# Patient Record
Sex: Female | Born: 1965
Health system: Southern US, Community
[De-identification: ages and names within clinical notes are randomized; demographics above are authoritative.]

## PROBLEM LIST (undated history)

## (undated) VITALS — BP 119/94 | HR 83 | Temp 98.4°F | Resp 18 | Ht 63.0 in | Wt 162.0 lb

## (undated) DIAGNOSIS — F419 Anxiety disorder, unspecified: Secondary | ICD-10-CM

## (undated) DIAGNOSIS — J45909 Unspecified asthma, uncomplicated: Secondary | ICD-10-CM

## (undated) DIAGNOSIS — I10 Essential (primary) hypertension: Secondary | ICD-10-CM

## (undated) DIAGNOSIS — F329 Major depressive disorder, single episode, unspecified: Secondary | ICD-10-CM

## (undated) DIAGNOSIS — I639 Cerebral infarction, unspecified: Secondary | ICD-10-CM

## (undated) DIAGNOSIS — G8929 Other chronic pain: Secondary | ICD-10-CM

## (undated) DIAGNOSIS — M1990 Unspecified osteoarthritis, unspecified site: Secondary | ICD-10-CM

## (undated) DIAGNOSIS — E669 Obesity, unspecified: Secondary | ICD-10-CM

## (undated) DIAGNOSIS — F32A Depression, unspecified: Secondary | ICD-10-CM

## (undated) HISTORY — DX: Unspecified osteoarthritis, unspecified site: M19.90

## (undated) HISTORY — DX: Essential (primary) hypertension: I10

## (undated) HISTORY — DX: Unspecified asthma, uncomplicated: J45.909

---

## 1993-01-13 HISTORY — PX: TUBAL LIGATION: SHX77

## 1999-01-14 HISTORY — PX: APPENDECTOMY: SHX54

## 2007-04-29 ENCOUNTER — Emergency Department (HOSPITAL_COMMUNITY): Admission: EM | Admit: 2007-04-29 | Discharge: 2007-04-29 | Payer: Self-pay | Admitting: Emergency Medicine

## 2007-11-28 ENCOUNTER — Emergency Department (HOSPITAL_COMMUNITY): Admission: EM | Admit: 2007-11-28 | Discharge: 2007-11-28 | Payer: Self-pay | Admitting: Emergency Medicine

## 2008-03-16 ENCOUNTER — Emergency Department (HOSPITAL_COMMUNITY): Admission: EM | Admit: 2008-03-16 | Discharge: 2008-03-16 | Payer: Self-pay | Admitting: Emergency Medicine

## 2008-03-16 ENCOUNTER — Encounter (INDEPENDENT_AMBULATORY_CARE_PROVIDER_SITE_OTHER): Payer: Self-pay | Admitting: Emergency Medicine

## 2008-03-16 ENCOUNTER — Ambulatory Visit: Payer: Self-pay | Admitting: Surgery

## 2009-12-13 ENCOUNTER — Emergency Department (HOSPITAL_COMMUNITY)
Admission: EM | Admit: 2009-12-13 | Discharge: 2009-12-13 | Payer: Self-pay | Source: Home / Self Care | Admitting: Emergency Medicine

## 2010-02-16 ENCOUNTER — Emergency Department (HOSPITAL_COMMUNITY)
Admission: EM | Admit: 2010-02-16 | Discharge: 2010-02-16 | Disposition: A | Discharge status: Home / Self Care | Payer: Worker's Compensation | Attending: Emergency Medicine | Admitting: Emergency Medicine

## 2010-02-16 ENCOUNTER — Encounter (HOSPITAL_COMMUNITY): Payer: Self-pay

## 2010-02-16 ENCOUNTER — Emergency Department (HOSPITAL_COMMUNITY): Payer: Worker's Compensation

## 2010-02-16 DIAGNOSIS — Y9289 Other specified places as the place of occurrence of the external cause: Secondary | ICD-10-CM | POA: Insufficient documentation

## 2010-02-16 DIAGNOSIS — W1809XA Striking against other object with subsequent fall, initial encounter: Secondary | ICD-10-CM | POA: Insufficient documentation

## 2010-02-16 DIAGNOSIS — M25569 Pain in unspecified knee: Secondary | ICD-10-CM | POA: Insufficient documentation

## 2010-02-16 DIAGNOSIS — M25579 Pain in unspecified ankle and joints of unspecified foot: Secondary | ICD-10-CM | POA: Insufficient documentation

## 2010-02-16 HISTORY — DX: Obesity, unspecified: E66.9

## 2010-03-06 ENCOUNTER — Emergency Department (HOSPITAL_COMMUNITY): Payer: Worker's Compensation

## 2010-03-06 ENCOUNTER — Emergency Department (HOSPITAL_COMMUNITY)
Admission: EM | Admit: 2010-03-06 | Discharge: 2010-03-06 | Disposition: A | Discharge status: Home / Self Care | Payer: Worker's Compensation | Attending: Emergency Medicine | Admitting: Emergency Medicine

## 2010-03-06 DIAGNOSIS — M79609 Pain in unspecified limb: Secondary | ICD-10-CM | POA: Insufficient documentation

## 2010-03-06 DIAGNOSIS — Y9289 Other specified places as the place of occurrence of the external cause: Secondary | ICD-10-CM | POA: Insufficient documentation

## 2010-03-06 DIAGNOSIS — F17200 Nicotine dependence, unspecified, uncomplicated: Secondary | ICD-10-CM | POA: Insufficient documentation

## 2010-03-06 DIAGNOSIS — W19XXXA Unspecified fall, initial encounter: Secondary | ICD-10-CM | POA: Insufficient documentation

## 2010-04-25 LAB — CBC
Hemoglobin: 13.5 g/dL (ref 12.0–15.0)
MCHC: 33.2 g/dL (ref 30.0–36.0)
MCV: 86.9 fL (ref 78.0–100.0)
RBC: 4.68 MIL/uL (ref 3.87–5.11)

## 2010-05-24 ENCOUNTER — Emergency Department (HOSPITAL_COMMUNITY)
Admission: EM | Admit: 2010-05-24 | Discharge: 2010-05-24 | Disposition: A | Discharge status: Home / Self Care | Payer: Worker's Compensation | Attending: Emergency Medicine | Admitting: Emergency Medicine

## 2010-05-24 ENCOUNTER — Emergency Department (HOSPITAL_COMMUNITY): Payer: Worker's Compensation

## 2010-05-24 DIAGNOSIS — R079 Chest pain, unspecified: Secondary | ICD-10-CM | POA: Insufficient documentation

## 2010-05-24 DIAGNOSIS — R0609 Other forms of dyspnea: Secondary | ICD-10-CM | POA: Insufficient documentation

## 2010-05-24 DIAGNOSIS — R630 Anorexia: Secondary | ICD-10-CM | POA: Insufficient documentation

## 2010-05-24 DIAGNOSIS — R112 Nausea with vomiting, unspecified: Secondary | ICD-10-CM | POA: Insufficient documentation

## 2010-05-24 DIAGNOSIS — M25579 Pain in unspecified ankle and joints of unspecified foot: Secondary | ICD-10-CM | POA: Insufficient documentation

## 2010-05-24 DIAGNOSIS — R05 Cough: Secondary | ICD-10-CM | POA: Insufficient documentation

## 2010-05-24 DIAGNOSIS — R0989 Other specified symptoms and signs involving the circulatory and respiratory systems: Secondary | ICD-10-CM | POA: Insufficient documentation

## 2010-05-24 DIAGNOSIS — IMO0001 Reserved for inherently not codable concepts without codable children: Secondary | ICD-10-CM | POA: Insufficient documentation

## 2010-05-24 DIAGNOSIS — R059 Cough, unspecified: Secondary | ICD-10-CM | POA: Insufficient documentation

## 2010-05-24 DIAGNOSIS — J189 Pneumonia, unspecified organism: Secondary | ICD-10-CM | POA: Insufficient documentation

## 2010-05-25 ENCOUNTER — Emergency Department (HOSPITAL_COMMUNITY)
Admission: EM | Admit: 2010-05-25 | Discharge: 2010-05-25 | Disposition: A | Discharge status: Home / Self Care | Payer: Worker's Compensation | Attending: Emergency Medicine | Admitting: Emergency Medicine

## 2010-05-25 DIAGNOSIS — J069 Acute upper respiratory infection, unspecified: Secondary | ICD-10-CM | POA: Insufficient documentation

## 2010-05-25 DIAGNOSIS — F411 Generalized anxiety disorder: Secondary | ICD-10-CM | POA: Insufficient documentation

## 2010-05-25 DIAGNOSIS — R0602 Shortness of breath: Secondary | ICD-10-CM | POA: Insufficient documentation

## 2010-06-07 ENCOUNTER — Emergency Department (HOSPITAL_COMMUNITY)
Admission: EM | Admit: 2010-06-07 | Discharge: 2010-06-07 | Disposition: A | Discharge status: Home / Self Care | Payer: Worker's Compensation | Attending: Emergency Medicine | Admitting: Emergency Medicine

## 2010-06-07 DIAGNOSIS — F410 Panic disorder [episodic paroxysmal anxiety] without agoraphobia: Secondary | ICD-10-CM | POA: Insufficient documentation

## 2010-08-08 ENCOUNTER — Emergency Department (HOSPITAL_COMMUNITY): Payer: Worker's Compensation

## 2010-08-08 ENCOUNTER — Emergency Department (HOSPITAL_COMMUNITY)
Admission: EM | Admit: 2010-08-08 | Discharge: 2010-08-08 | Disposition: A | Discharge status: Home / Self Care | Payer: Worker's Compensation | Attending: Emergency Medicine | Admitting: Emergency Medicine

## 2010-08-08 DIAGNOSIS — M25579 Pain in unspecified ankle and joints of unspecified foot: Secondary | ICD-10-CM | POA: Insufficient documentation

## 2010-08-08 DIAGNOSIS — S82899A Other fracture of unspecified lower leg, initial encounter for closed fracture: Secondary | ICD-10-CM | POA: Insufficient documentation

## 2010-08-08 DIAGNOSIS — W19XXXA Unspecified fall, initial encounter: Secondary | ICD-10-CM | POA: Insufficient documentation

## 2011-02-20 ENCOUNTER — Emergency Department (HOSPITAL_COMMUNITY)
Admission: EM | Admit: 2011-02-20 | Discharge: 2011-02-20 | Disposition: A | Discharge status: Home / Self Care | Payer: Self-pay | Attending: Emergency Medicine | Admitting: Emergency Medicine

## 2011-02-20 ENCOUNTER — Emergency Department (HOSPITAL_COMMUNITY): Payer: Self-pay

## 2011-02-20 ENCOUNTER — Encounter (HOSPITAL_COMMUNITY): Payer: Self-pay | Admitting: Emergency Medicine

## 2011-02-20 DIAGNOSIS — M79609 Pain in unspecified limb: Secondary | ICD-10-CM | POA: Insufficient documentation

## 2011-02-20 DIAGNOSIS — M25561 Pain in right knee: Secondary | ICD-10-CM

## 2011-02-20 DIAGNOSIS — M545 Low back pain, unspecified: Secondary | ICD-10-CM | POA: Insufficient documentation

## 2011-02-20 DIAGNOSIS — G8929 Other chronic pain: Secondary | ICD-10-CM | POA: Insufficient documentation

## 2011-02-20 DIAGNOSIS — F17200 Nicotine dependence, unspecified, uncomplicated: Secondary | ICD-10-CM | POA: Insufficient documentation

## 2011-02-20 DIAGNOSIS — Z79899 Other long term (current) drug therapy: Secondary | ICD-10-CM | POA: Insufficient documentation

## 2011-02-20 DIAGNOSIS — M25569 Pain in unspecified knee: Secondary | ICD-10-CM | POA: Insufficient documentation

## 2011-02-20 DIAGNOSIS — M5430 Sciatica, unspecified side: Secondary | ICD-10-CM | POA: Insufficient documentation

## 2011-02-20 DIAGNOSIS — M25579 Pain in unspecified ankle and joints of unspecified foot: Secondary | ICD-10-CM | POA: Insufficient documentation

## 2011-02-20 DIAGNOSIS — M25562 Pain in left knee: Secondary | ICD-10-CM

## 2011-02-20 HISTORY — DX: Other chronic pain: G89.29

## 2011-02-20 MED ORDER — OXYCODONE-ACETAMINOPHEN 5-325 MG PO TABS
1.0000 | ORAL_TABLET | Freq: Once | ORAL | Status: AC
  Administered 2011-02-20: 1 via ORAL
  Filled 2011-02-20: qty 1

## 2011-02-20 MED ORDER — DIAZEPAM 5 MG PO TABS: 5.0000 mg | ORAL_TABLET | Freq: Three times a day (TID) | ORAL | Status: AC | PRN

## 2011-02-20 MED ORDER — OXYCODONE-ACETAMINOPHEN 5-325 MG PO TABS: 1.0000 | ORAL_TABLET | Freq: Four times a day (QID) | ORAL | Status: AC | PRN

## 2011-02-20 MED ORDER — PREDNISONE 50 MG PO TABS: 50.0000 mg | ORAL_TABLET | Freq: Every day | ORAL | Status: AC

## 2011-02-20 NOTE — Progress Notes (Signed)
Orthopedic Tech Progress Note Patient Details:  Brittany Jimenez 1966-01-02 [id number]  Other Ortho Devices Type of Ortho Device: ASO Ortho Device Interventions: Application   Brittany Jimenez, Brittany Jimenez 02/20/2011, 12:53 PM

## 2011-02-20 NOTE — ED Notes (Signed)
Pt c/o lower back, bilateral knee and bilateral ankle pain x several months; pt sts broke right ankle last July and now is giving out on her. Pt sts knee and back pain since last march; pt sts hx of anxiety and appears anxious at present

## 2011-02-24 NOTE — ED Provider Notes (Signed)
Medical screening examination/treatment/procedure(s) were performed by non-physician practitioner and as supervising physician I was immediately available for consultation/collaboration.   Tatianna Ibbotson L Candy Ziegler, MD 02/24/11 1922 

## 2011-02-24 NOTE — ED Provider Notes (Signed)
History     CSN: [id number]  Arrival date & time 02/20/11  7829   First MD Initiated Contact with Patient 02/20/11 1013      Chief Complaint  Patient presents with  . Leg Pain  . Knee Pain  . Back Pain    (Consider location/radiation/quality/duration/timing/severity/associated sxs/prior treatment) HPI The patient presents to the ER with chronic lower back, bilateral knee, and bilateral ankle pain(R>L) for the last few months. The patient states that she fell at work last March and since then has had worsening problems with her various spots of pain. The patient denies numbness, weakness, N/V, abd pain, CP, SOB, bowel or bladder incontinence, or diarrhea. The patient states that she has tried nothing at home for the pain. Movement seems to make all of her pain worse. Past Medical History  Diagnosis Date  . Obesity   . Chronic pain     History reviewed. No pertinent past surgical history.  History reviewed. No pertinent family history.  History  Substance Use Topics  . Smoking status: Current Everyday Smoker  . Smokeless tobacco: Not on file  . Alcohol Use: No    OB History    Grav Para Term Preterm Abortions TAB SAB Ect Mult Living                  Review of Systems All pertinent positives and negatives reviewed in the history of present illness  Allergies  Review of patient's allergies indicates no known allergies.  Home Medications   Current Outpatient Rx  Name Route Sig Dispense Refill  . ACETAMINOPHEN 500 MG PO TABS Oral Take 1,000 mg by mouth every 6 (six) hours as needed. For pain.    Marland Kitchen DIAZEPAM 5 MG PO TABS Oral Take 1 tablet (5 mg total) by mouth every 8 (eight) hours as needed (Spasm). 12 tablet 0  . OXYCODONE-ACETAMINOPHEN 5-325 MG PO TABS Oral Take 1 tablet by mouth every 6 (six) hours as needed for pain. 15 tablet 0  . PREDNISONE 50 MG PO TABS Oral Take 1 tablet (50 mg total) by mouth daily. 5 tablet 0    BP 152/84  Pulse 56  Temp(Src) 97.8 F  (36.6 C) (Oral)  Resp 20  SpO2 100%  LMP 02/07/2011  Physical Exam  Constitutional: She is oriented to person, place, and time. She appears well-developed and well-nourished.  HENT:  Head: Normocephalic and atraumatic.  Cardiovascular: Normal rate, regular rhythm and normal heart sounds.  Exam reveals no gallop and no friction rub.   No murmur heard. Pulmonary/Chest: Effort normal and breath sounds normal. No respiratory distress. She has no wheezes. She has no rales.  Musculoskeletal:       Right knee: She exhibits normal range of motion, no swelling, no effusion, no deformity, normal patellar mobility and no bony tenderness. tenderness found.       Left knee: She exhibits normal range of motion, no swelling, no effusion, no ecchymosis and no deformity. tenderness found.       Right ankle: She exhibits normal range of motion, no swelling, no ecchymosis and no deformity. tenderness.       Left ankle: She exhibits normal range of motion, no swelling, no ecchymosis and no deformity. tenderness.       Arms:      Legs: Neurological: She is alert and oriented to person, place, and time. She has normal strength and normal reflexes. No sensory deficit. Coordination and gait normal.  Reflex Scores:  Patellar reflexes are 2+ on the right side and 2+ on the left side.      Achilles reflexes are 2+ on the right side and 2+ on the left side. Skin: Skin is warm and dry. No rash noted.    ED Course  Procedures (including critical care time)  Labs Reviewed - No data to display No results found.   1. Sciatica   2. Ankle pain   3. Knee pain, bilateral    The patient most likely has arthritic changes in the affected joints. She is stable at this time and will be directed for follow up. Ice and heat to her knees and back and ankles. The patient was asking me directed questions that seemed to be leading to attempt to say that Dr. Luiz Blare may have caused these degenerative changes in her ankle  seen on x-ray. I advised her that she may have had this developing previous to her fractures and that the injury can accelerate the process. I advised her that her fractures have healed and are in normal healed alignment.    MDM  MDM Reviewed: vitals, nursing note and previous chart Reviewed previous: x-ray Interpretation: x-ray           Carlyle Dolly, PA-C 02/24/11 [id number]

## 2011-04-17 ENCOUNTER — Emergency Department (HOSPITAL_COMMUNITY): Payer: Self-pay

## 2011-04-17 ENCOUNTER — Other Ambulatory Visit: Payer: Self-pay

## 2011-04-17 ENCOUNTER — Emergency Department (HOSPITAL_COMMUNITY)
Admission: EM | Admit: 2011-04-17 | Discharge: 2011-04-17 | Disposition: A | Discharge status: Home / Self Care | Payer: Self-pay | Attending: Emergency Medicine | Admitting: Emergency Medicine

## 2011-04-17 DIAGNOSIS — F909 Attention-deficit hyperactivity disorder, unspecified type: Secondary | ICD-10-CM | POA: Insufficient documentation

## 2011-04-17 DIAGNOSIS — R450 Nervousness: Secondary | ICD-10-CM | POA: Insufficient documentation

## 2011-04-17 DIAGNOSIS — F419 Anxiety disorder, unspecified: Secondary | ICD-10-CM

## 2011-04-17 DIAGNOSIS — Z79899 Other long term (current) drug therapy: Secondary | ICD-10-CM | POA: Insufficient documentation

## 2011-04-17 DIAGNOSIS — R0789 Other chest pain: Secondary | ICD-10-CM | POA: Insufficient documentation

## 2011-04-17 DIAGNOSIS — F17200 Nicotine dependence, unspecified, uncomplicated: Secondary | ICD-10-CM | POA: Insufficient documentation

## 2011-04-17 DIAGNOSIS — F411 Generalized anxiety disorder: Secondary | ICD-10-CM | POA: Insufficient documentation

## 2011-04-17 DIAGNOSIS — Z760 Encounter for issue of repeat prescription: Secondary | ICD-10-CM | POA: Insufficient documentation

## 2011-04-17 DIAGNOSIS — R0602 Shortness of breath: Secondary | ICD-10-CM | POA: Insufficient documentation

## 2011-04-17 MED ORDER — LORAZEPAM 1 MG PO TABS
1.0000 mg | ORAL_TABLET | Freq: Once | ORAL | Status: AC
  Administered 2011-04-17: 1 mg via ORAL
  Filled 2011-04-17: qty 1

## 2011-04-17 MED ORDER — TRAMADOL HCL 50 MG PO TABS: 50.0000 mg | ORAL_TABLET | Freq: Four times a day (QID) | ORAL | Status: AC | PRN

## 2011-04-17 MED ORDER — LORAZEPAM 1 MG PO TABS: 1.0000 mg | ORAL_TABLET | Freq: Three times a day (TID) | ORAL | Status: AC | PRN

## 2011-04-17 NOTE — ED Notes (Signed)
Discharged to home with written and verbal instructions.  No questions or concerns at discharge.

## 2011-04-17 NOTE — ED Notes (Addendum)
Pt states that she has been out of her pills for anxiety, back spasms and pain. For a few days, and that when she walks she develops SOB. Has a history of this and says that taking her anxiety pills help. Pt speaks full sentences, and o2 saturation 100%. Lungs clear bilaterally, and diminished lower base right side.

## 2011-04-17 NOTE — ED Notes (Signed)
Returned from radiology. 

## 2011-04-17 NOTE — ED Provider Notes (Signed)
History     CSN: [id number]  Arrival date & time 04/17/11  1018   First MD Initiated Contact with Patient 04/17/11 1101      Chief Complaint  Patient presents with  . Shortness of Breath  . Panic Attack  . Medication Refill    (Consider location/radiation/quality/duration/timing/severity/associated sxs/prior treatment) HPI Comments: Patient complains of "anxiety attack" for the past 3 days. He said the police raided a house on her [address] and she's been upset ever since. Associated with shortness of breath and substernal chest pain that has been constant for the past 3 days. She normally takes a pill for anxiety but ran out 2 days ago. She also has her chronic back pain which is unchanged. There is no weakness, numbness, tingling, bowel or bladder incontinence, fever or vomiting.  The history is provided by the patient.    Past Medical History  Diagnosis Date  . Obesity   . Chronic pain     No past surgical history on file.  No family history on file.  History  Substance Use Topics  . Smoking status: Current Everyday Smoker  . Smokeless tobacco: Not on file  . Alcohol Use: No    OB History    Grav Para Term Preterm Abortions TAB SAB Ect Mult Living                  Review of Systems  Constitutional: Positive for activity change and appetite change. Negative for fever.  HENT: Negative for congestion and rhinorrhea.   Eyes: Negative for visual disturbance.  Respiratory: Positive for chest tightness and shortness of breath.   Cardiovascular: Positive for chest pain.  Gastrointestinal: Negative for nausea, vomiting and abdominal pain.  Genitourinary: Negative for dysuria and hematuria.  Musculoskeletal: Negative for back pain.  Psychiatric/Behavioral: Negative for suicidal ideas. The patient is nervous/anxious and is hyperactive.     Allergies  Review of patient's allergies indicates no known allergies.  Home Medications   Current Outpatient Rx  Name Route Sig  Dispense Refill  . ACETAMINOPHEN 500 MG PO TABS Oral Take 1,000 mg by mouth every 6 (six) hours as needed. For pain.    Marland Kitchen DIAZEPAM 5 MG PO TABS Oral Take 5 mg by mouth every 8 (eight) hours as needed. anxiety    . CYMBALTA PO Oral Take 1 capsule by mouth See admin instructions. Patient took one dose Monday 04/14/11    . OXYCODONE-ACETAMINOPHEN 5-325 MG PO TABS Oral Take 1 tablet by mouth every 4 (four) hours as needed. pain    . LORAZEPAM 1 MG PO TABS Oral Take 1 tablet (1 mg total) by mouth 3 (three) times daily as needed for anxiety. 5 tablet 0  . TRAMADOL HCL 50 MG PO TABS Oral Take 1 tablet (50 mg total) by mouth every 6 (six) hours as needed for pain. 15 tablet 0    BP 153/94  Pulse 78  Temp(Src) 98.3 F (36.8 C) (Oral)  Resp 20  SpO2 99%  Physical Exam  Constitutional: She is oriented to person, place, and time. She appears well-developed and well-nourished. No distress.  HENT:  Head: Normocephalic and atraumatic.  Mouth/Throat: Oropharynx is clear and moist. No oropharyngeal exudate.  Eyes: Conjunctivae are normal. Pupils are equal, round, and reactive to light.  Neck: Neck supple.  Cardiovascular: Normal rate, regular rhythm and normal heart sounds.   No murmur heard. Pulmonary/Chest: Effort normal and breath sounds normal. No respiratory distress.  Abdominal: Soft. There is no tenderness. There  is no rebound and no guarding.  Musculoskeletal: Normal range of motion. She exhibits no edema and no tenderness.  Neurological: She is alert and oriented to person, place, and time. No cranial nerve deficit.  Skin: Skin is warm.    ED Course  Procedures (including critical care time)  Labs Reviewed - No data to display No results found.   1. Anxiety       MDM  Chronic back pain, anxiety, chest pain or shortness of breath. Symptoms consistent with anxiety per patient. Description of chest pain not consistent with ACS. PERC neg.  Chest x-ray negative. No ischemic changes on  EKG. Symptoms resolved. Patient comfortable no distress   Date: 04/17/2011  Rate: 57 Rhythm: sinus bradycardia  QRS Axis: normal  Intervals: normal  ST/T Wave abnormalities: normal  Conduction Disutrbances:none  Narrative Interpretation:   Old EKG Reviewed: none available      Glynn Octave, MD 04/17/11 1308

## 2011-04-17 NOTE — Discharge Instructions (Signed)
Anxiety and Panic Attacks Your caregiver has informed you that you are having an anxiety or panic attack. There may be many forms of this. Most of the time these attacks come suddenly and without warning. They come at any time of day, including periods of sleep, and at any time of life. They may be strong and unexplained. Although panic attacks are very scary, they are physically harmless. Sometimes the cause of your anxiety is not known. Anxiety is a protective mechanism of the body in its fight or flight mechanism. Most of these perceived danger situations are actually nonphysical situations (such as anxiety over losing a job). CAUSES  The causes of an anxiety or panic attack are many. Panic attacks may occur in otherwise healthy people given a certain set of circumstances. There may be a genetic cause for panic attacks. Some medications may also have anxiety as a side effect. SYMPTOMS  Some of the most common feelings are:  Intense terror.   Dizziness, feeling faint.   Hot and cold flashes.   Fear of going crazy.   Feelings that nothing is real.   Sweating.   Shaking.   Chest pain or a fast heartbeat (palpitations).   Smothering, choking sensations.   Feelings of impending doom and that death is near.   Tingling of extremities, this may be from over-breathing.   Altered reality (derealization).   Being detached from yourself (depersonalization).  Several symptoms can be present to make up anxiety or panic attacks. DIAGNOSIS  The evaluation by your caregiver will depend on the type of symptoms you are experiencing. The diagnosis of anxiety or panic attack is made when no physical illness can be determined to be a cause of the symptoms. TREATMENT  Treatment to prevent anxiety and panic attacks may include:  Avoidance of circumstances that cause anxiety.   Reassurance and relaxation.   Regular exercise.   Relaxation therapies, such as yoga.   Psychotherapy with a  psychiatrist or therapist.   Avoidance of caffeine, alcohol and illegal drugs.   Prescribed medication.  SEEK IMMEDIATE MEDICAL CARE IF:   You experience panic attack symptoms that are different than your usual symptoms.   You have any worsening or concerning symptoms.  Document Released: 12/30/2004 Document Revised: 12/19/2010 Document Reviewed: 05/03/2009 [facility] Patient Information 2012 [place], [place].

## 2011-05-12 ENCOUNTER — Encounter (HOSPITAL_COMMUNITY): Payer: Self-pay | Admitting: Emergency Medicine

## 2011-05-12 ENCOUNTER — Emergency Department (HOSPITAL_COMMUNITY): Payer: Self-pay

## 2011-05-12 ENCOUNTER — Emergency Department (HOSPITAL_COMMUNITY)
Admission: EM | Admit: 2011-05-12 | Discharge: 2011-05-12 | Disposition: A | Discharge status: Home / Self Care | Payer: Self-pay | Attending: Emergency Medicine | Admitting: Emergency Medicine

## 2011-05-12 DIAGNOSIS — M25569 Pain in unspecified knee: Secondary | ICD-10-CM | POA: Insufficient documentation

## 2011-05-12 DIAGNOSIS — M25469 Effusion, unspecified knee: Secondary | ICD-10-CM | POA: Insufficient documentation

## 2011-05-12 DIAGNOSIS — M25579 Pain in unspecified ankle and joints of unspecified foot: Secondary | ICD-10-CM | POA: Insufficient documentation

## 2011-05-12 DIAGNOSIS — M25571 Pain in right ankle and joints of right foot: Secondary | ICD-10-CM

## 2011-05-12 DIAGNOSIS — M25462 Effusion, left knee: Secondary | ICD-10-CM

## 2011-05-12 DIAGNOSIS — G8929 Other chronic pain: Secondary | ICD-10-CM | POA: Insufficient documentation

## 2011-05-12 DIAGNOSIS — F17200 Nicotine dependence, unspecified, uncomplicated: Secondary | ICD-10-CM | POA: Insufficient documentation

## 2011-05-12 HISTORY — DX: Depression, unspecified: F32.A

## 2011-05-12 HISTORY — DX: Major depressive disorder, single episode, unspecified: F32.9

## 2011-05-12 MED ORDER — IBUPROFEN 800 MG PO TABS
800.0000 mg | ORAL_TABLET | Freq: Once | ORAL | Status: AC
  Administered 2011-05-12: 800 mg via ORAL
  Filled 2011-05-12: qty 1

## 2011-05-12 MED ORDER — TRAMADOL HCL 50 MG PO TABS: 50.0000 mg | ORAL_TABLET | Freq: Four times a day (QID) | ORAL | Status: DC | PRN

## 2011-05-12 MED ORDER — IBUPROFEN 600 MG PO TABS: 600.0000 mg | ORAL_TABLET | Freq: Four times a day (QID) | ORAL | Status: DC | PRN

## 2011-05-12 NOTE — ED Notes (Signed)
Ortho paged. 

## 2011-05-12 NOTE — ED Provider Notes (Signed)
History     CSN: [id number]  Arrival date & time 05/12/11  1104   First MD Initiated Contact with Patient 05/12/11 1246      Chief Complaint  Patient presents with  . Knee Pain    (Consider location/radiation/quality/duration/timing/severity/associated sxs/prior treatment) HPI Pt has had 4 days of L ankle pain and R knee pain. States she was walking a moderate amount before symptoms started. No trauma. Pt broke R ankle and states she needed surgery but chose not to have it. She has had episodic pain since. No fever, chills, warmth or redness.  Past Medical History  Diagnosis Date  . Obesity   . Chronic pain   . Depression     Past Surgical History  Procedure Date  . Appendectomy     No family history on file.  History  Substance Use Topics  . Smoking status: Current Everyday Smoker  . Smokeless tobacco: Not on file  . Alcohol Use: No    OB History    Grav Para Term Preterm Abortions TAB SAB Ect Mult Living                  Review of Systems  Constitutional: Negative for fever and chills.  Musculoskeletal: Positive for joint swelling and arthralgias. Negative for myalgias and back pain.  Skin: Negative for pallor, rash and wound.  Neurological: Negative for weakness and numbness.    Allergies  Review of patient's allergies indicates no known allergies.  Home Medications   Current Outpatient Rx  Name Route Sig Dispense Refill  . ACETAMINOPHEN 500 MG PO TABS Oral Take 1,000 mg by mouth every 6 (six) hours as needed. For pain.    . ALBUTEROL SULFATE HFA 108 (90 BASE) MCG/ACT IN AERS Inhalation Inhale 2 puffs into the lungs every 6 (six) hours as needed. For shortness of breath    . DIAZEPAM 5 MG PO TABS Oral Take 5 mg by mouth every 8 (eight) hours as needed. anxiety    . DULOXETINE HCL 30 MG PO CPEP Oral Take 30 mg by mouth daily.    . OXYCODONE-ACETAMINOPHEN 5-325 MG PO TABS Oral Take 1 tablet by mouth every 4 (four) hours as needed. pain    . IBUPROFEN  600 MG PO TABS Oral Take 1 tablet (600 mg total) by mouth every 6 (six) hours as needed for pain. 30 tablet 0  . TRAMADOL HCL 50 MG PO TABS Oral Take 1 tablet (50 mg total) by mouth every 6 (six) hours as needed for pain. 15 tablet 0    BP 149/89  Pulse 73  Temp(Src) 98.1 F (36.7 C) (Oral)  Resp 18  SpO2 100%  LMP 05/06/2011  Physical Exam  Nursing note and vitals reviewed. Constitutional: She is oriented to person, place, and time. She appears well-developed and well-nourished. No distress.  HENT:  Head: Normocephalic and atraumatic.  Mouth/Throat: Oropharynx is clear and moist.  Eyes: EOM are normal. Pupils are equal, round, and reactive to light.  Neck: Normal range of motion. Neck supple.  Cardiovascular: Normal rate and regular rhythm.   Pulmonary/Chest: Effort normal and breath sounds normal. No respiratory distress. She has no wheezes. She has no rales.  Abdominal: Soft. Bowel sounds are normal. There is no tenderness. There is no rebound and no guarding.  Musculoskeletal: Normal range of motion. She exhibits tenderness (mild ttp over medial surface of L knee along joint line. No deformity, laxity, obvious trauma or swelling. R ankle without trauma, +ttp over lat  mal. FROM). She exhibits no edema.  Neurological: She is alert and oriented to person, place, and time.       5/5 motor and sensation intact  Skin: Skin is warm and dry. No rash noted. No erythema.  Psychiatric: She has a normal mood and affect. Her behavior is normal.    ED Course  Procedures (including critical care time)  Labs Reviewed - No data to display Dg Knee 2 Views Left  05/12/2011  *RADIOLOGY REPORT*  Clinical Data: Knee pain, swelling  LEFT KNEE - 1-2 VIEW  Comparison: None.  Findings: Two views of the left knee submitted.  No acute fracture or subluxation.  There is spurring of the medial femoral condyle. Question small joint effusion. Mild narrowing of medial joint compartment.  IMPRESSION: No acute  fracture or subluxation.  Spurring of medial femoral condyle.  Question small joint effusion.  Mild narrowing of the medial joint compartment.  Original Report Authenticated By: Natasha Mead, M.D.   Dg Ankle 2 Views Right  05/12/2011  *RADIOLOGY REPORT*  Clinical Data: Ankle pain  RIGHT ANKLE - 2 VIEW  Comparison: 02/20/2011  Findings: Two views of the right ankle submitted. No acute fracture or subluxation.  Again noted  healed fracture of the distal tibia and fibula with secondary degenerative changes.  No radiopaque foreign body.  Ankle mortise is preserved.  IMPRESSION: No acute fracture or subluxation. Again noted  healed fracture of distal tibia and fibula with secondary degenerative changes.  Original Report Authenticated By: Natasha Mead, M.D.     1. Knee effusion, left   2. Right ankle pain       MDM          Loren Racer, MD 05/12/11 1451

## 2011-05-12 NOTE — Progress Notes (Signed)
Orthopedic Tech Progress Note Patient Details:  Brittany Jimenez 04/29/1965 [id number]  Other Ortho Devices Type of Ortho Device: Knee Sleeve Ortho Device Location: (L) LE Ortho Device Interventions: Application   Jennye Moccasin 05/12/2011, 3:02 PM

## 2011-05-12 NOTE — Discharge Instructions (Signed)
Arthralgia Your caregiver has diagnosed you as suffering from an arthralgia. Arthralgia means there is pain in a joint. This can come from many reasons including:  Bruising the joint which causes soreness (inflammation) in the joint.   Wear and tear on the joints which occur as we grow older (osteoarthritis).   Overusing the joint.   Various forms of arthritis.   Infections of the joint.  Regardless of the cause of pain in your joint, most of these different pains respond to anti-inflammatory drugs and rest. The exception to this is when a joint is infected, and these cases are treated with antibiotics, if it is a bacterial infection. HOME CARE INSTRUCTIONS   Rest the injured area for as long as directed by your caregiver. Then slowly start using the joint as directed by your caregiver and as the pain allows. Crutches as directed may be useful if the ankles, knees or hips are involved. If the knee was splinted or casted, continue use and care as directed. If an stretchy or elastic wrapping bandage has been applied today, it should be removed and re-applied every 3 to 4 hours. It should not be applied tightly, but firmly enough to keep swelling down. Watch toes and feet for swelling, bluish discoloration, coldness, numbness or excessive pain. If any of these problems (symptoms) occur, remove the ace bandage and re-apply more loosely. If these symptoms persist, contact your caregiver or return to this location.   For the first 24 hours, keep the injured extremity elevated on pillows while lying down.   Apply ice for 15 to 20 minutes to the sore joint every couple hours while awake for the first half day. Then 3 to 4 times per day for the first 48 hours. Put the ice in a plastic bag and place a towel between the bag of ice and your skin.   Wear any splinting, casting, elastic bandage applications, or slings as instructed.   Only take over-the-counter or prescription medicines for pain,  discomfort, or fever as directed by your caregiver. Do not use aspirin immediately after the injury unless instructed by your physician. Aspirin can cause increased bleeding and bruising of the tissues.   If you were given crutches, continue to use them as instructed and do not resume weight bearing on the sore joint until instructed.  Persistent pain and inability to use the sore joint as directed for more than 2 to 3 days are warning signs indicating that you should see a caregiver for a follow-up visit as soon as possible. Initially, a hairline fracture (break in bone) may not be evident on X-rays. Persistent pain and swelling indicate that further evaluation, non-weight bearing or use of the joint (use of crutches or slings as instructed), or further X-rays are indicated. X-rays may sometimes not show a small fracture until a week or 10 days later. Make a follow-up appointment with your own caregiver or one to whom we have referred you. A radiologist (specialist in reading X-rays) may read your X-rays. Make sure you know how you are to obtain your X-ray results. Do not assume everything is normal if you do not hear from us. SEEK MEDICAL CARE IF: Bruising, swelling, or pain increases. SEEK IMMEDIATE MEDICAL CARE IF:   Your fingers or toes are numb or blue.   The pain is not responding to medications and continues to stay the same or get worse.   The pain in your joint becomes severe.   You develop a fever over   102 F (38.9 C).   It becomes impossible to move or use the joint.  MAKE SURE YOU:   Understand these instructions.   Will watch your condition.   Will get help right away if you are not doing well or get worse.  Document Released: 12/30/2004 Document Revised: 12/19/2010 Document Reviewed: 08/18/2007 [employer] Patient Information 2012 [employer]. 

## 2011-05-12 NOTE — ED Notes (Signed)
Rt ankle pain  Left knee pain and swelling  x 4 days denies injury

## 2011-05-22 ENCOUNTER — Emergency Department (HOSPITAL_COMMUNITY)
Admission: EM | Admit: 2011-05-22 | Discharge: 2011-05-22 | Disposition: A | Discharge status: Home / Self Care | Payer: Self-pay | Attending: Emergency Medicine | Admitting: Emergency Medicine

## 2011-05-22 ENCOUNTER — Emergency Department (HOSPITAL_COMMUNITY): Payer: Self-pay

## 2011-05-22 ENCOUNTER — Encounter (HOSPITAL_COMMUNITY): Payer: Self-pay | Admitting: Emergency Medicine

## 2011-05-22 DIAGNOSIS — F329 Major depressive disorder, single episode, unspecified: Secondary | ICD-10-CM | POA: Insufficient documentation

## 2011-05-22 DIAGNOSIS — G8929 Other chronic pain: Secondary | ICD-10-CM | POA: Insufficient documentation

## 2011-05-22 DIAGNOSIS — F17200 Nicotine dependence, unspecified, uncomplicated: Secondary | ICD-10-CM | POA: Insufficient documentation

## 2011-05-22 DIAGNOSIS — R791 Abnormal coagulation profile: Secondary | ICD-10-CM | POA: Insufficient documentation

## 2011-05-22 DIAGNOSIS — R072 Precordial pain: Secondary | ICD-10-CM | POA: Insufficient documentation

## 2011-05-22 DIAGNOSIS — F3289 Other specified depressive episodes: Secondary | ICD-10-CM | POA: Insufficient documentation

## 2011-05-22 LAB — CBC
HCT: 40 % (ref 36.0–46.0)
Hemoglobin: 13.8 g/dL (ref 12.0–15.0)
MCHC: 34.5 g/dL (ref 30.0–36.0)
RBC: 4.79 MIL/uL (ref 3.87–5.11)
WBC: 8 10*3/uL (ref 4.0–10.5)

## 2011-05-22 LAB — DIFFERENTIAL
Basophils Absolute: 0 10*3/uL (ref 0.0–0.1)
Lymphocytes Relative: 30 % (ref 12–46)
Lymphs Abs: 2.4 10*3/uL (ref 0.7–4.0)
Monocytes Absolute: 0.6 10*3/uL (ref 0.1–1.0)
Monocytes Relative: 8 % (ref 3–12)
Neutro Abs: 4.4 10*3/uL (ref 1.7–7.7)
Neutrophils Relative %: 55 % (ref 43–77)

## 2011-05-22 LAB — BASIC METABOLIC PANEL
Calcium: 9 mg/dL (ref 8.4–10.5)
Creatinine, Ser: 0.76 mg/dL (ref 0.50–1.10)
GFR calc non Af Amer: >90 mL/min (ref >90)
Glucose, Bld: 86 mg/dL (ref 70–99)
Sodium: 136 mEq/L (ref 135–145)

## 2011-05-22 LAB — D-DIMER, QUANTITATIVE: D-Dimer, Quant: 0.95 ug/mL-FEU — ABNORMAL HIGH (ref 0.00–0.48)

## 2011-05-22 LAB — TROPONIN I: Troponin I: <0.3 ng/mL (ref <0.30)

## 2011-05-22 MED ORDER — IOHEXOL 350 MG/ML SOLN
80.0000 mL | Freq: Once | INTRAVENOUS | Status: AC | PRN
  Administered 2011-05-22: 80 mL via INTRAVENOUS

## 2011-05-22 MED ORDER — IBUPROFEN 600 MG PO TABS: 600.0000 mg | ORAL_TABLET | Freq: Four times a day (QID) | ORAL | Status: AC | PRN

## 2011-05-22 MED ORDER — MORPHINE SULFATE 4 MG/ML IJ SOLN
4.0000 mg | Freq: Once | INTRAMUSCULAR | Status: AC
  Administered 2011-05-22: 4 mg via INTRAVENOUS
  Filled 2011-05-22: qty 1

## 2011-05-22 MED ORDER — ASPIRIN 81 MG PO CHEW
324.0000 mg | CHEWABLE_TABLET | Freq: Once | ORAL | Status: AC
  Administered 2011-05-22: 324 mg via ORAL
  Filled 2011-05-22: qty 4

## 2011-05-22 NOTE — ED Notes (Signed)
Patient states constant chest pain for three days continued today. Pain for the past three days 10/10 achy sharp with intermittent shortness of breath.  Airway intact bilateral equal chest rise and fall.  Patient stated has radiating right upper extremity tingling. Radial pulse +2 bilateral skin warm.  No distress noted upon ambulating from triage to stretcher 6. Steady gait.

## 2011-05-22 NOTE — Discharge Instructions (Signed)
Chest Pain (Nonspecific) Chest pain has many causes. Your pain could be caused by something serious, such as a heart attack or a blood clot in the lungs. It could also be caused by something less serious, such as a chest bruise or a virus. Follow up with your doctor. More lab tests or other studies may be needed to find the cause of your pain. Most of the time, nonspecific chest pain will improve within 2 to 3 days of rest and mild pain medicine. HOME CARE  For chest bruises, you may put ice on the sore area for 15 to 20 minutes, 3 to 4 times a day. Do this only if it makes you or your child feel better.   Put ice in a plastic bag.   Place a towel between the skin and the bag.   Rest for the next 2 to 3 days.   Go back to work if the pain improves.   See your doctor if the pain lasts longer than 1 to 2 weeks.   Only take medicine as told by your doctor.   Quit smoking if you smoke.  GET HELP RIGHT AWAY IF:   There is more pain or pain that spreads to the arm, neck, jaw, back, or belly (abdomen).   You or your child has shortness of breath.   You or your child coughs more than usual or coughs up blood.   You or your child has very bad back or belly pain, feels sick to his or her stomach (nauseous), or throws up (vomits).   You or your child has very bad weakness.   You or your child passes out (faints).   You or your child has a temperature by mouth above 102 F (38.9 C), not controlled by medicine.  Any of these problems may be serious and may be an emergency. Do not wait to see if the problems will go away. Get medical help right away. Call your local emergency services 911 in [place] Do not drive yourself to the hospital. MAKE SURE YOU:   Understand these instructions.   Will watch this condition.   Will get help right away if you or your child is not doing well or gets worse.  Document Released: 06/18/2007 Document Revised: 12/19/2010 Document Reviewed:  06/18/2007 ExitCare Patient Information 2012 [employer]  RESOURCE GUIDE  Dental Problems  Patients with Medicaid: [facility]                     [facility] [address]                                           [address] Phone:  [phone]                                                   Phone:  [phone]  If unable to pay or uninsured, contact:  [facility] or [facility] to become qualified for the adult dental clinic.  Chronic Pain Problems Contact [facility]  [phone] Patients need to be referred by their primary care doctor.  Insufficient Money for Medicine Contact [employer]:  call "211" or   [facility] 271-5999.  No Primary Care Doctor Call [facility]  832-8000 Other agencies that provide inexpensive medical care    [facility]  [phone]    [facility]  [phone]    [facility]  [phone]    [facility]  [phone]    [facility]  [phone]    [facility]  [phone]  [facility]  Health  [phone] [facility]  [phone] [facility]   [phone] (emergency services [phone])  Abuse/Neglect [place] Child Abuse Hotline (336) 641-3795 [place] [facility] Hotline 800-378-5315 (After Hours)  Emergency Shelter [facility] (336) 271-5985  [facility] (336) 275-9566 [facility] (704) 372-4663  MRSA Hotline #:   832-7006    [place] [facility]  [facility] [place]  [employer]                           [facility] [address] [place]                     [address]         [address]                                                 [place]                              [place] Phone:  [phone]                                   Phone:  [phone]                   Phone:  [phone]  [facility] Phone:  [phone]  [place] Child Abuse Hotline (336) 342-1394 (336) 342-3537 (After Hours)  

## 2011-05-22 NOTE — Progress Notes (Signed)
This patient has received 20 ml's of IV omni 350 (type of contrast) contrast extravasation into LT Antecubital (part of body) during a Ct angio Chest exam.  The exam was performed on (date) 05/22/2011  Site / affected area assessed by Dr. Molli Posey  I personally examined the patient. She had a small "lump" in her distal anterior arm, just proximal to the antecubital fossa.  She stated that the area was not tender to touch.  The skin was cool and intact without erythema.  Patient had 2+ radial pulse, normal capillary refill at nail beds and normal grip strength.  She stated she was having no parathesias.  Post-extravasation instructions were given to the patient.  Her nurse in the ER was alerted to the extravasation.

## 2011-05-22 NOTE — ED Provider Notes (Signed)
History   This chart was scribed for Forbes Cellar, MD by Melba Coon. The patient was seen in room CDU04/CDU04 and the patient's care was started at 5:27PM.    CSN: [id number]  Arrival date & time 05/22/11  1551   None     Chief Complaint  Patient presents with  . Hypertension  . Chest Pain    (Consider location/radiation/quality/duration/timing/severity/associated sxs/prior treatment) HPI Brittany Alroy Dust, RN 05/22/2011 16:08    Per pt, checked BP at home- first about 10 am: 160/101 reports it kept going up max : 193/106; pt also reports sharp pain midsternal X few days; pt also reports lightheadedness today; felt tingling in R arm today and having pain behind neck; pt reports SOB; denies n/v         Brittany Jimenez is a 46 y.o. female who presents to the Emergency Department complaining of constant moderate to severe, sharp, radiaitng sternal chest pain with an onset 3 days ago. Pain radiates to her bilateral sides of neck and shoulders/back and pt rates the pain 10/10. Pt also c/o HTN today 193/106 at home. Pt has a hx of anxiety attacks but pt states that current symptoms are nothing like previous attacks. Physical exertion and deep inhalation aggravate the pain. SOB present. No diaphoresis., HA, fever, neck pain, sore throat, rash, back pain, CP, SOB, abd pain, n/v/d, dysuria, or extremity pain, edema, weakness, numbness, or tingling. No hx of heart attacks. Family Hx of cardiac problems and blood clots. No known allergies. No other pertinent medical symptoms.  Past Medical History  Diagnosis Date  . Obesity   . Chronic pain   . Depression     Past Surgical History  Procedure Date  . Appendectomy     History reviewed. No pertinent family history.  History  Substance Use Topics  . Smoking status: Current Everyday Smoker -- 0.5 packs/day  . Smokeless tobacco: Not on file  . Alcohol Use: No    OB History    Grav Para Term Preterm Abortions TAB SAB Ect Mult Living                    Review of Systems 10 Systems reviewed and all are negative for acute change except as noted in the HPI.   Allergies  Review of patient's allergies indicates no known allergies.  Home Medications   Current Outpatient Rx  Name Route Sig Dispense Refill  . ALBUTEROL SULFATE HFA 108 (90 BASE) MCG/ACT IN AERS Inhalation Inhale 2 puffs into the lungs every 6 (six) hours as needed. For shortness of breath    . DULOXETINE HCL 30 MG PO CPEP Oral Take 30 mg by mouth daily.    . IBUPROFEN 600 MG PO TABS Oral Take 600 mg by mouth every 6 (six) hours as needed. For pain.    Marland Kitchen LORAZEPAM 1 MG PO TABS Oral Take 1 mg by mouth every 8 (eight) hours as needed. For anxiety    . OXYCODONE-ACETAMINOPHEN 5-325 MG PO TABS Oral Take 1 tablet by mouth every 4 (four) hours as needed. pain    . TRAMADOL HCL 50 MG PO TABS Oral Take 50 mg by mouth every 6 (six) hours as needed. For pain.    Marland Kitchen ZOLPIDEM TARTRATE 5 MG PO TABS Oral Take 5 mg by mouth at bedtime as needed. For sleep    . IBUPROFEN 600 MG PO TABS Oral Take 1 tablet (600 mg total) by mouth every 6 (six) hours as needed for  pain. 30 tablet 0    BP 130/83  Pulse 60  Temp(Src) 99 F (37.2 C) (Oral)  Resp 18  SpO2 100%  LMP 05/06/2011  Physical Exam  Nursing note and vitals reviewed. Constitutional: She is oriented to person, place, and time. She appears well-developed and well-nourished. No distress.  HENT:  Head: Normocephalic and atraumatic.  Eyes: EOM are normal. Pupils are equal, round, and reactive to light.  Neck: Normal range of motion. Neck supple. No tracheal deviation present.  Cardiovascular: Normal rate, regular rhythm and normal heart sounds.  Exam reveals no gallop and no friction rub.   No murmur heard. Pulmonary/Chest: Effort normal. No respiratory distress. She has no wheezes. She has no rales. She exhibits tenderness.  Abdominal: Soft. She exhibits no distension. There is no tenderness. There is no rebound and  no guarding.  Musculoskeletal: Normal range of motion. She exhibits no edema and no tenderness.  Neurological: She is alert and oriented to person, place, and time.  Skin: Skin is warm and dry. No rash noted.  Psychiatric: She has a normal mood and affect. Her behavior is normal.    Date: 05/22/2011  Rate: 66  Rhythm: normal sinus rhythm  QRS Axis: normal  Intervals: normal  ST/T Wave abnormalities: normal  Conduction Disutrbances:none  Narrative Interpretation: baseline artifact  Old EKG Reviewed: unchanged   ED Course  Procedures (including critical care time)  DIAGNOSTIC STUDIES: Oxygen Saturation is 100% on room air, normal by my interpretation.    COORDINATION OF CARE:  5:34PM - Blood w/u and CXR will be ordered for the pt.   Labs Reviewed  DIFFERENTIAL - Abnormal; Notable for the following:    Eosinophils Relative 6 (*)    All other components within normal limits  D-DIMER, QUANTITATIVE - Abnormal; Notable for the following:    D-Dimer, Quant 0.95 (*)    All other components within normal limits  CBC  BASIC METABOLIC PANEL  TROPONIN I   Dg Chest 2 View  05/22/2011  *RADIOLOGY REPORT*  Clinical Data: Hypertension.  Chest pain.  CHEST - 2 VIEW  Comparison: None.  Findings:  Cardiopericardial silhouette within normal limits. Mediastinal contours normal. Trachea midline.  No airspace disease or effusion.  Faint increased density over the right first rib end appears similar to the prior exam of 05/24/2010, likely representing costochondral calcification.  IMPRESSION: No active cardiopulmonary disease.  Original Report Authenticated By: Andreas Newport, M.D.   Ct Angio Chest W/cm &/or Wo Cm  05/22/2011  *RADIOLOGY REPORT*  Clinical Data: Chest pain.  Pulmonary embolus.  Elevated D-dimer.  CT ANGIOGRAPHY CHEST  Technique:  Multidetector CT imaging of the chest using the standard protocol during bolus administration of intravenous contrast. Multiplanar reconstructed images  including MIPs were obtained and reviewed to evaluate the vascular anatomy.  Contrast: 80mL OMNIPAQUE IOHEXOL 350 MG/ML SOLN  Comparison: 05/22/2011 chest radiograph.  Findings: There is some bolus dispersion on this CT angiogram however the study is diagnostic and there is no pulmonary embolus.  Thyroid goiter is present with diffuse severely enlarged thyroid gland.  Peripherally calcified nodule is present in the left thyroid lobe, probably representing calcified colloid cyst. Incidental imaging the upper abdomen is within normal limits.  No aortic abnormality.  Three-vessel aortic arch with mild branch vessel atherosclerosis.  There is no axillary adenopathy.  No mediastinal or hilar adenopathy.  No effusion.  The lungs demonstrate atelectasis.  Bones are within normal limits.  IMPRESSION: Negative CTA of the chest.  Original Report  Authenticated By: Andreas Newport, M.D.     1. Chest pain     MDM  Constant cp x 3 days. Reproducible on exam. Fmhx of VTE. Dimer elevated. CTA negative. Given constant nature of pain x 3 days with negative troponin doubt ACS. Feeling better in ED. BP stable. Will discharge home with pain medication and f/u with her PMD.   I personally performed the services described in this documentation, which was scribed in my presence. The recorded information has been reviewed and considered.          Forbes Cellar, MD 05/22/11 2119

## 2011-05-22 NOTE — ED Notes (Signed)
Per pt, checked BP at home- first about 10 am: 160/101 reports it kept going up max : 193/106; pt also reports sharp pain midsternal X few days; pt also reports lightheadedness today; felt tingling in R arm today and having pain behind neck; pt reports SOB; denies n/v

## 2011-06-11 ENCOUNTER — Emergency Department (HOSPITAL_COMMUNITY)
Admission: EM | Admit: 2011-06-11 | Discharge: 2011-06-11 | Disposition: A | Discharge status: Home / Self Care | Payer: Self-pay | Attending: Emergency Medicine | Admitting: Emergency Medicine

## 2011-06-11 ENCOUNTER — Encounter (HOSPITAL_COMMUNITY): Payer: Self-pay | Admitting: Emergency Medicine

## 2011-06-11 DIAGNOSIS — H5710 Ocular pain, unspecified eye: Secondary | ICD-10-CM | POA: Insufficient documentation

## 2011-06-11 DIAGNOSIS — S058X9A Other injuries of unspecified eye and orbit, initial encounter: Secondary | ICD-10-CM | POA: Insufficient documentation

## 2011-06-11 DIAGNOSIS — S0500XA Injury of conjunctiva and corneal abrasion without foreign body, unspecified eye, initial encounter: Secondary | ICD-10-CM

## 2011-06-11 DIAGNOSIS — G8929 Other chronic pain: Secondary | ICD-10-CM | POA: Insufficient documentation

## 2011-06-11 DIAGNOSIS — H11419 Vascular abnormalities of conjunctiva, unspecified eye: Secondary | ICD-10-CM | POA: Insufficient documentation

## 2011-06-11 DIAGNOSIS — X58XXXA Exposure to other specified factors, initial encounter: Secondary | ICD-10-CM | POA: Insufficient documentation

## 2011-06-11 DIAGNOSIS — F329 Major depressive disorder, single episode, unspecified: Secondary | ICD-10-CM | POA: Insufficient documentation

## 2011-06-11 DIAGNOSIS — H5789 Other specified disorders of eye and adnexa: Secondary | ICD-10-CM | POA: Insufficient documentation

## 2011-06-11 DIAGNOSIS — F3289 Other specified depressive episodes: Secondary | ICD-10-CM | POA: Insufficient documentation

## 2011-06-11 MED ORDER — IBUPROFEN 800 MG PO TABS
800.0000 mg | ORAL_TABLET | Freq: Three times a day (TID) | ORAL | Status: AC | PRN
Start: 2011-06-11 — End: 2011-06-21

## 2011-06-11 MED ORDER — MOXIFLOXACIN HCL 0.5 % OP SOLN: OPHTHALMIC | Status: DC

## 2011-06-11 MED ORDER — FLUORESCEIN SODIUM 1 MG OP STRP
ORAL_STRIP | OPHTHALMIC | Status: AC
  Administered 2011-06-11: 12:00:00
  Filled 2011-06-11: qty 1

## 2011-06-11 MED ORDER — PROPARACAINE HCL 0.5 % OP SOLN
OPHTHALMIC | Status: AC
  Administered 2011-06-11: 12:00:00
  Filled 2011-06-11: qty 15

## 2011-06-11 NOTE — ED Notes (Signed)
Left eye red and pain since yesterday

## 2011-06-11 NOTE — ED Notes (Signed)
Patient was discharged by float RN.  Patient denied pain, but did say "i am irritated with it" when referencing her eye.

## 2011-06-11 NOTE — ED Notes (Signed)
Patient resting quietly, calm, with unlabored respirations.

## 2011-06-11 NOTE — ED Provider Notes (Signed)
History     CSN: [id number]  Arrival date & time 06/11/11  1005   First MD Initiated Contact with Patient 06/11/11 1047      Chief Complaint  Patient presents with  . Eye Pain    (Consider location/radiation/quality/duration/timing/severity/associated sxs/prior treatment) HPI The patient presents to the ER with a two day history of L eye redness and irritation. The patient states that she has been rubbing her eye as well. The patient denies any trauma. She states that it is just irritated no matter what I ask her about her eye. The patient denies fever, N/V, visual changes, headache, or swelling. The patient states that she has not tried any treatment for her eye issue.  Past Medical History  Diagnosis Date  . Obesity   . Chronic pain   . Depression     Past Surgical History  Procedure Date  . Appendectomy     No family history on file.  History  Substance Use Topics  . Smoking status: Current Everyday Smoker -- 0.5 packs/day  . Smokeless tobacco: Not on file  . Alcohol Use: No    OB History    Grav Para Term Preterm Abortions TAB SAB Ect Mult Living                  Review of Systems All other systems negative except as documented in the HPI. All pertinent positives and negatives as reviewed in the HPI.  Allergies  Review of patient's allergies indicates no known allergies.  Home Medications   Current Outpatient Rx  Name Route Sig Dispense Refill  . ALBUTEROL SULFATE HFA 108 (90 BASE) MCG/ACT IN AERS Inhalation Inhale 2 puffs into the lungs every 6 (six) hours as needed. For shortness of breath    . DULOXETINE HCL 30 MG PO CPEP Oral Take 30 mg by mouth daily.    Marland Kitchen LORAZEPAM 1 MG PO TABS Oral Take 1 mg by mouth every 8 (eight) hours as needed. For anxiety    . OXYCODONE-ACETAMINOPHEN 5-325 MG PO TABS Oral Take 1 tablet by mouth every 4 (four) hours as needed. pain    . ZOLPIDEM TARTRATE 5 MG PO TABS Oral Take 5 mg by mouth at bedtime as needed. For sleep       BP 147/80  Pulse 80  Temp(Src) 98.2 F (36.8 C) (Oral)  Resp 18  SpO2 100%  Physical Exam  Constitutional: She appears well-developed and well-nourished. No distress.  HENT:  Head: Normocephalic and atraumatic.  Eyes: EOM are normal. Pupils are equal, round, and reactive to light. Right eye exhibits no discharge. Left eye exhibits no discharge, no exudate and no hordeolum. No foreign body present in the left eye. Left conjunctiva is injected. Left conjunctiva has no hemorrhage.  Slit lamp exam:      The left eye shows corneal abrasion and fluorescein uptake. The left eye shows no corneal ulcer, no foreign body, no hyphema, no hypopyon and no anterior chamber bulge.    ED Course  Procedures (including critical care time)  The patient will be referred to opthalmology for recheck and further care. She is in agreeance with the plan. The patient has an area of abrasion it appears to the L cornea. The patient is requesting percocet for her eye. The patient is advised to return here as needed.  MDM          Carlyle Dolly, PA-C 06/13/11 [phone]

## 2011-06-11 NOTE — Discharge Instructions (Signed)
Follow up with the eye doctor provided tomorrow by calling his office for an appointment and advise him that we wanted you to be rechecked.

## 2011-06-13 NOTE — ED Provider Notes (Signed)
Medical screening examination/treatment/procedure(s) were performed by non-physician practitioner and as supervising physician I was immediately available for consultation/collaboration.  Cheri Guppy, MD 06/13/11 [phone]

## 2011-11-12 ENCOUNTER — Emergency Department (HOSPITAL_COMMUNITY): Payer: Self-pay

## 2011-11-12 ENCOUNTER — Emergency Department (HOSPITAL_COMMUNITY)
Admission: EM | Admit: 2011-11-12 | Discharge: 2011-11-12 | Disposition: A | Discharge status: Home / Self Care | Payer: Self-pay | Attending: Emergency Medicine | Admitting: Emergency Medicine

## 2011-11-12 ENCOUNTER — Encounter (HOSPITAL_COMMUNITY): Payer: Self-pay

## 2011-11-12 DIAGNOSIS — F329 Major depressive disorder, single episode, unspecified: Secondary | ICD-10-CM | POA: Insufficient documentation

## 2011-11-12 DIAGNOSIS — Z79899 Other long term (current) drug therapy: Secondary | ICD-10-CM | POA: Insufficient documentation

## 2011-11-12 DIAGNOSIS — M25569 Pain in unspecified knee: Secondary | ICD-10-CM | POA: Insufficient documentation

## 2011-11-12 DIAGNOSIS — Y929 Unspecified place or not applicable: Secondary | ICD-10-CM | POA: Insufficient documentation

## 2011-11-12 DIAGNOSIS — F3289 Other specified depressive episodes: Secondary | ICD-10-CM | POA: Insufficient documentation

## 2011-11-12 DIAGNOSIS — F17200 Nicotine dependence, unspecified, uncomplicated: Secondary | ICD-10-CM | POA: Insufficient documentation

## 2011-11-12 DIAGNOSIS — M25562 Pain in left knee: Secondary | ICD-10-CM

## 2011-11-12 DIAGNOSIS — X58XXXA Exposure to other specified factors, initial encounter: Secondary | ICD-10-CM | POA: Insufficient documentation

## 2011-11-12 DIAGNOSIS — S93409A Sprain of unspecified ligament of unspecified ankle, initial encounter: Secondary | ICD-10-CM | POA: Insufficient documentation

## 2011-11-12 DIAGNOSIS — G8929 Other chronic pain: Secondary | ICD-10-CM | POA: Insufficient documentation

## 2011-11-12 DIAGNOSIS — E669 Obesity, unspecified: Secondary | ICD-10-CM | POA: Insufficient documentation

## 2011-11-12 DIAGNOSIS — Y939 Activity, unspecified: Secondary | ICD-10-CM | POA: Insufficient documentation

## 2011-11-12 MED ORDER — HYDROCODONE-ACETAMINOPHEN 5-325 MG PO TABS
2.0000 | ORAL_TABLET | Freq: Once | ORAL | Status: AC
  Administered 2011-11-12: 2 via ORAL
  Filled 2011-11-12: qty 2

## 2011-11-12 MED ORDER — HYDROCODONE-ACETAMINOPHEN 5-325 MG PO TABS: 2.0000 | ORAL_TABLET | ORAL | Status: DC | PRN

## 2011-11-12 NOTE — ED Notes (Signed)
Pt already has a splint. Order for ASO canceled.

## 2011-11-12 NOTE — ED Provider Notes (Signed)
History     CSN: [id number]  Arrival date & time 11/12/11  6578   First MD Initiated Contact with Patient 11/12/11 1023      Chief Complaint  Patient presents with  . Ankle Pain  . Knee Pain    (Consider location/radiation/quality/duration/timing/severity/associated sxs/prior treatment) Patient is a 46 y.o. female presenting with ankle pain and knee pain. The history is provided by the patient and medical records.  Ankle Pain  Pertinent negatives include no numbness.  Knee Pain Associated symptoms include arthralgias and joint swelling. Pertinent negatives include no numbness.    SUBJECTIVE: Brittany Jimenez is a 46 y.o. female who complains of inversion injury to the right ankle 4 day(s) ago. Immediate symptoms: immediate pain, delayed swelling, was able to bear weight directly after injury, no deformity was noted by the patient. Symptoms have been constant and worsening since that time. Prior history of related problems: no prior problems with this area in the past as it was broken 1 yr ago. There is pain and swelling at the lateral aspect of that ankle. She also c/o L knee pain at triage, but states that this is chronic for her and no worse than her usual.  She has not had any decreased ROM of the joint, but does have pain with walking.     Past Medical History  Diagnosis Date  . Obesity   . Chronic pain   . Depression     Past Surgical History  Procedure Date  . Appendectomy     No family history on file.  History  Substance Use Topics  . Smoking status: Current Every Day Smoker -- 0.5 packs/day  . Smokeless tobacco: Not on file  . Alcohol Use: No    OB History    Grav Para Term Preterm Abortions TAB SAB Ect Mult Living                  Review of Systems  Musculoskeletal: Positive for joint swelling and arthralgias.  Skin: Negative for wound.  Neurological: Negative for numbness.  All other systems reviewed and are negative.    Allergies  Review of  patient's allergies indicates no known allergies.  Home Medications   Current Outpatient Rx  Name Route Sig Dispense Refill  . ALBUTEROL SULFATE HFA 108 (90 BASE) MCG/ACT IN AERS Inhalation Inhale 2 puffs into the lungs every 6 (six) hours as needed. For shortness of breath    . BUPROPION HCL 100 MG PO TABS Oral Take 200 mg by mouth 2 (two) times daily.    . DULOXETINE HCL 30 MG PO CPEP Oral Take 30 mg by mouth 2 (two) times daily.     Marland Kitchen LORAZEPAM 1 MG PO TABS Oral Take 1 mg by mouth every 8 (eight) hours as needed. For anxiety    . ZOLPIDEM TARTRATE 5 MG PO TABS Oral Take 5 mg by mouth at bedtime as needed. For sleep    . HYDROCODONE-ACETAMINOPHEN 5-325 MG PO TABS Oral Take 2 tablets by mouth every 4 (four) hours as needed for pain. 6 tablet 0    BP 139/86  Pulse 76  Temp 98.3 F (36.8 C) (Oral)  Resp 18  SpO2 100%  LMP 09/30/2011  Physical Exam  Nursing note and vitals reviewed. Constitutional: She appears well-developed and well-nourished. No distress.  HENT:  Head: Normocephalic and atraumatic.  Eyes: Conjunctivae normal are normal.  Neck: Normal range of motion.  Cardiovascular: Normal rate, regular rhythm, normal heart sounds and intact distal  pulses.  Exam reveals no gallop and no friction rub.   No murmur heard.      Capillary refill < 3 sec  Pulmonary/Chest: Effort normal and breath sounds normal. No respiratory distress. She has no wheezes.  Musculoskeletal: Normal range of motion. She exhibits edema (mild, of the lateral aspect of the R ankle) and tenderness.       ROM: full ROM of ankle with pain There is swelling and tenderness over the lateral malleolus. No tenderness over the medial aspect of the ankle. The fifth metatarsal is not tender. The ankle joint is intact without excessive opening on stressing.   L knee with full ROM with mild pain.   Neurological: She is alert. She has normal reflexes. Coordination normal.       Sensation intact Strength 5/5 in the R  toes, ankle, knee, hip  Skin: Skin is warm and dry. No rash noted. She is not diaphoretic.    ED Course  Procedures (including critical care time)  Labs Reviewed - No data to display Dg Ankle Complete Right  11/12/2011  *RADIOLOGY REPORT*  Clinical Data: Pain.  Twisting injury.  RIGHT ANKLE - COMPLETE 3+ VIEW  Comparison: 05/12/2011  Findings: Mild degenerative changes in the right ankle. No acute bony abnormality.  Specifically, no fracture, subluxation, or dislocation.  Soft tissues are intact.  IMPRESSION: No acute bony abnormality.   Original Report Authenticated By: Cyndie Chime, M.D.      1. Ankle sprain   2. Chronic pain of left knee       MDM  Hardie Shackleton with pain to her R ankle and knee.  Knee pain is chronic and unchanged since the twisting of her ankle. X-ray: no fracture or dislocation noted  ASSESSMENT: Ankle  Sprain, chronic L knee pain  PLAN: rest the injured area as much as practical, apply ice packs, elevate the injured limb, compressive bandage, splint dispensed and applied, prescription for analgesic given, prescription for NSAID given See orders for this visit as documented in the electronic medical record.  1. Medications: norco  2. Treatment: RICE, use ASO from previous fracture, use crutches as needed, take ibuprofen for pain and Norco for breakthrough pain  3. Follow Up: with ortho as listed above        Dierdre Forth, PA-C 11/12/11 1135

## 2011-11-12 NOTE — ED Notes (Signed)
Pt here for right ankle pain and left knee pain after stepping into hole.

## 2011-11-12 NOTE — ED Provider Notes (Signed)
Medical screening examination/treatment/procedure(s) were performed by non-physician practitioner and as supervising physician I was immediately available for consultation/collaboration.  Makaelah Cranfield, MD 11/12/11 1602 

## 2011-11-12 NOTE — ED Notes (Signed)
Returned from xray

## 2011-11-12 NOTE — ED Notes (Addendum)
Patient transported to X-ray 

## 2011-11-12 NOTE — ED Notes (Signed)
Pt turned her ankle on Saturday. Pain has not improved so came to ED today. Mild discoloration right lateral ankle.

## 2011-11-12 NOTE — ED Notes (Signed)
Pt requesting pain med 

## 2011-11-27 ENCOUNTER — Emergency Department (HOSPITAL_COMMUNITY)
Admission: EM | Admit: 2011-11-27 | Discharge: 2011-11-27 | Disposition: A | Discharge status: Home Health | Payer: Worker's Compensation | Attending: Emergency Medicine | Admitting: Emergency Medicine

## 2011-11-27 ENCOUNTER — Emergency Department (HOSPITAL_COMMUNITY): Payer: Worker's Compensation

## 2011-11-27 ENCOUNTER — Encounter (HOSPITAL_COMMUNITY): Payer: Self-pay

## 2011-11-27 DIAGNOSIS — Z79899 Other long term (current) drug therapy: Secondary | ICD-10-CM | POA: Insufficient documentation

## 2011-11-27 DIAGNOSIS — G8929 Other chronic pain: Secondary | ICD-10-CM | POA: Insufficient documentation

## 2011-11-27 DIAGNOSIS — F329 Major depressive disorder, single episode, unspecified: Secondary | ICD-10-CM | POA: Insufficient documentation

## 2011-11-27 DIAGNOSIS — M25579 Pain in unspecified ankle and joints of unspecified foot: Secondary | ICD-10-CM | POA: Insufficient documentation

## 2011-11-27 DIAGNOSIS — F3289 Other specified depressive episodes: Secondary | ICD-10-CM | POA: Insufficient documentation

## 2011-11-27 DIAGNOSIS — N390 Urinary tract infection, site not specified: Secondary | ICD-10-CM | POA: Insufficient documentation

## 2011-11-27 DIAGNOSIS — E669 Obesity, unspecified: Secondary | ICD-10-CM | POA: Insufficient documentation

## 2011-11-27 LAB — URINALYSIS, ROUTINE W REFLEX MICROSCOPIC
Ketones, ur: NEGATIVE mg/dL
Nitrite: POSITIVE — AB
pH: 6 (ref 5.0–8.0)

## 2011-11-27 LAB — URINE MICROSCOPIC-ADD ON

## 2011-11-27 MED ORDER — NITROFURANTOIN MONOHYD MACRO 100 MG PO CAPS: 100.0000 mg | ORAL_CAPSULE | Freq: Two times a day (BID) | ORAL | Status: DC

## 2011-11-27 MED ORDER — HYDROCODONE-ACETAMINOPHEN 5-325 MG PO TABS: 1.0000 | ORAL_TABLET | ORAL | Status: DC | PRN

## 2011-11-27 MED ORDER — HYDROCODONE-ACETAMINOPHEN 5-325 MG PO TABS
1.0000 | ORAL_TABLET | Freq: Once | ORAL | Status: AC
  Administered 2011-11-27: 1 via ORAL
  Filled 2011-11-27: qty 1

## 2011-11-27 NOTE — ED Provider Notes (Signed)
History     CSN: [id number]  Arrival date & time 11/27/11  4098   First MD Initiated Contact with Patient 11/27/11 0848      Chief Complaint  Patient presents with  . Dysuria    (Consider location/radiation/quality/duration/timing/severity/associated sxs/prior treatment) HPI Onset was two days ago with gradually worsening course since that time.  The pain is burning while urination described as mild. Modifying factors: worse with urination.  Associated symptoms: no fever, no abd pain, no emesis.  Recent medical care: none for dysuria.  Pt also complains of left ankle pain. Had a right ankle sprain and feels she has been putting more weight on the left ankle. Has been treated with tylenol, motrin with decent relief.    Past Medical History  Diagnosis Date  . Obesity   . Chronic pain   . Depression     Past Surgical History  Procedure Date  . Appendectomy     No family history on file.  History  Substance Use Topics  . Smoking status: Current Every Day Smoker -- 0.5 packs/day  . Smokeless tobacco: Not on file  . Alcohol Use: No    OB History    Grav Para Term Preterm Abortions TAB SAB Ect Mult Living                  Review of Systems Constitutional: Negative for fever.  Eyes: Negative for vision loss.  ENT: Negative for difficulty swallowing.  Cardiovascular: Negative for chest pain. Respiratory: Negative for respiratory distress.  Gastrointestinal:  Negative for vomiting.  Genitourinary: Negative for inability to void. Musculoskeletal: Positive for gait problem. Still able to walk normally but has increased pain.   Integumentary: Negative for rash.  Neurological: Negative for new focal weakness.     Allergies  Review of patient's allergies indicates no known allergies.  Home Medications   Current Outpatient Rx  Name  Route  Sig  Dispense  Refill  . ALBUTEROL SULFATE HFA 108 (90 BASE) MCG/ACT IN AERS   Inhalation   Inhale 2 puffs into the lungs  every 6 (six) hours as needed. For shortness of breath         . BUPROPION HCL 100 MG PO TABS   Oral   Take 200 mg by mouth 2 (two) times daily.         . DULOXETINE HCL 30 MG PO CPEP   Oral   Take 30 mg by mouth 2 (two) times daily.          Marland Kitchen ZOLPIDEM TARTRATE 5 MG PO TABS   Oral   Take 5 mg by mouth at bedtime as needed. For sleep         . LORAZEPAM 1 MG PO TABS   Oral   Take 1 mg by mouth every 8 (eight) hours as needed. For anxiety           BP 147/100  Pulse 85  Temp 98.2 F (36.8 C) (Oral)  Resp 20  SpO2 99%  LMP 09/30/2011  Physical Exam Nursing note and vitals reviewed.  Constitutional: Pt is alert and appears stated age. Eyes: No injection, no scleral icterus. HENT: Atraumatic, airway open without erythema or exudate.  Respiratory: No respiratory distress. Equal breathing bilaterally. Cardiovascular: Normal rate. Extremities warm and well perfused.  Abdomen: Soft, non-tender. MSK: Extremities are atraumatic without deformity. Left lateral malleolus is tender to palpation. No swelling or erythema. Achilles tendon is intact.  Skin: No rash, no wounds.  Neuro: No motor nor sensory deficit.     ED Course  Procedures (including critical care time)  Labs Reviewed  URINALYSIS, ROUTINE W REFLEX MICROSCOPIC - Abnormal; Notable for the following:    Color, Urine AMBER (*)  BIOCHEMICALS MAY BE AFFECTED BY COLOR   APPearance CLOUDY (*)     Hgb urine dipstick LARGE (*)     Bilirubin Urine SMALL (*)     Protein, ur >300 (*)     Nitrite POSITIVE (*)     Leukocytes, UA LARGE (*)     All other components within normal limits  URINE MICROSCOPIC-ADD ON - Abnormal; Notable for the following:    Bacteria, UA MANY (*)     Casts HYALINE CASTS (*)     All other components within normal limits  URINE CULTURE   Dg Ankle 2 Views Left  11/27/2011  *RADIOLOGY REPORT*  Clinical Data: Pain, no known injury  LEFT ANKLE - 2 VIEW  Comparison: None.  Findings: Two  views of the left ankle submitted.  No acute fracture or subluxation.  Ankle mortise is preserved.  IMPRESSION: No acute fracture or subluxation.   Original Report Authenticated By: Natasha Mead, M.D.      1. UTI (lower urinary tract infection)   2. Ankle pain       MDM  46 y.o. female w/ PMHx of obesity, chronic pain, depression, tobacco use, s/p appy presents w/ ongoing knee, ankle pain, new dysuria. Here 11/12/11 and diagnosed with R ankle sprain and chronic knee pain. Now with dysuria and L ankle pain. Knee pain is chronic and will not w/u. Dysuria without any indication of pyelo. Will image left ankle pain. Not a septic joint, achillies is intact. No open wound. If xray neg will advise symptomatic care. If UTI is positive will treat. Pt will be okay for d/c.   UA c/w infection. Plan to treat. Xray without fx. Plan for symptomatic care. Counseling provided regarding diagnosis, treatment plan, follow up recommendations, and return precautions. Questions answered.     I independently viewed, interpreted, and used in my medical decision making all ordered lab and imaging tests. Medical Decision Making discussed with ED attending Geoffery Lyons, MD          Charm Barges, MD 11/27/11 1000

## 2011-11-27 NOTE — ED Notes (Signed)
Pt c/o ongoing knee and ankle pain.  Also c/o pain with voiding

## 2011-11-27 NOTE — ED Notes (Signed)
UA obtained and sent.

## 2011-11-27 NOTE — ED Notes (Signed)
Pt returned from xray, sitting with husband watching TV

## 2011-11-28 NOTE — ED Provider Notes (Signed)
I saw and evaluated the patient, reviewed the resident's note and I agree with the findings and plan.  I saw the patient along with Dr. Gregary Cromer.  The patient presents with burning with urination and ankle pain. She denies injury or trauma and there are no fevers or chills.    On exam, the patient is afebrile and the vitals are stable.  The heart and lung exam is unremarkable and the abdomen shows mild suprapubic ttp.  There is no rebound or guarding and there is no cva ttp.  The left ankle appears grossly normal.  There is no swelling or deformity and non pain with range of motion.  UA reveals a uti and the xrays of the ankle negative.  Will treat with antibiotics, pain meds, rest and time.  To return prn for any problems.  Geoffery Lyons, MD 11/28/11 [phone]

## 2011-11-29 LAB — URINE CULTURE: Colony Count: >=100000

## 2011-11-30 NOTE — ED Notes (Signed)
+  Urine. Patient treated with Macrobid. Sensitive to same. Per protocol MD. °

## 2011-12-30 ENCOUNTER — Ambulatory Visit: Payer: Self-pay | Admitting: Family Medicine

## 2012-01-09 ENCOUNTER — Ambulatory Visit (INDEPENDENT_AMBULATORY_CARE_PROVIDER_SITE_OTHER): Payer: Self-pay | Admitting: Family Medicine

## 2012-01-09 ENCOUNTER — Encounter: Payer: Self-pay | Admitting: Family Medicine

## 2012-01-09 VITALS — BP 122/82 | HR 66 | Temp 98.0°F | Ht 63.0 in | Wt 191.6 lb

## 2012-01-09 DIAGNOSIS — J4520 Mild intermittent asthma, uncomplicated: Secondary | ICD-10-CM | POA: Insufficient documentation

## 2012-01-09 DIAGNOSIS — I10 Essential (primary) hypertension: Secondary | ICD-10-CM

## 2012-01-09 DIAGNOSIS — M1990 Unspecified osteoarthritis, unspecified site: Secondary | ICD-10-CM | POA: Insufficient documentation

## 2012-01-09 DIAGNOSIS — M129 Arthropathy, unspecified: Secondary | ICD-10-CM

## 2012-01-09 DIAGNOSIS — J45909 Unspecified asthma, uncomplicated: Secondary | ICD-10-CM

## 2012-01-09 MED ORDER — TRAMADOL HCL 50 MG PO TABS: 50.0000 mg | ORAL_TABLET | Freq: Two times a day (BID) | ORAL | Status: DC | PRN

## 2012-01-09 NOTE — Assessment & Plan Note (Signed)
Patient used to go to ER for pain exacerbations and usually given Rx for Norco.  Patient is out of Norco, but is not requesting for refills at this time.  She agrees to try Tramadol PRN for severe pain not relived by Motrin or Tylenol.  If ankle pain worsens or she cannot bear weight on it, will consider referral to Orthopedist and/or PT.  Red flags reviewed with patient and per AVS.

## 2012-01-09 NOTE — Assessment & Plan Note (Signed)
Seems to be diet controlled - not on medication.  BP at goal, will monitor.

## 2012-01-09 NOTE — Patient Instructions (Addendum)
It was nice to meet you today, Brittany Jimenez. Please pick up Tramadol and take twice per day as needed for pain.  Hold for drowsiness. Continue to wear ankle brace daily. Schedule follow up visit with me in 2 months or sooner as needed.

## 2012-01-09 NOTE — Progress Notes (Signed)
  Subjective:    Patient ID: Brittany Jimenez, female    DOB: 11/21/1965, 46 y.o.   MRN: [id number]  HPI  New patient here to establish care.  No previous PCP - used to go to ER for acute issues.  Used to see Dr. Luiz Blare for worker's comp injury last year.  Joint pain of multiple sites: Patient has been going to ER for acute on chronic pain.  Pain is located LT knee, RT ankle.  Has been going on March 2012 after worker's comp injury.  In August 2012, had another ankle injury and was told fractured ankle.  Since then, patient has had difficulty with RT ankle, frequent sprains and now walks with a cane.  She has tried taking Tylenol PRN and Motrin PRN over the counter for pain.  She also goes to ER when pain is severe and receives percocet.  She has not taken anything today.  Able to walk and bear weight on ankle.  Wearing ankle brace daily.  Denies any numbness or tingling of feet.  Depression/Anxiety: Patient seen at [facility] for depression and medications managed per Dr. Olga Coaster.  On Cymbalta, bupropion, and Ambien.  I have reviewed patient's PMH, SH, Surgical Hx, FH, Medications, Allergies, and Problem List.   Review of Systems  Per HPI    Objective:   Physical Exam  Constitutional: She appears well-nourished. No distress.  Cardiovascular: Normal rate.   Pulmonary/Chest: Effort normal.  Abdominal: Soft.  Musculoskeletal:       Right ankle: She exhibits normal range of motion and no swelling. tenderness. Achilles tendon normal.  Neurological: No cranial nerve deficit. Coordination normal.       Assessment & Plan:

## 2012-02-25 ENCOUNTER — Ambulatory Visit: Payer: Self-pay | Admitting: Family Medicine

## 2012-03-03 ENCOUNTER — Ambulatory Visit: Payer: Self-pay | Admitting: Family Medicine

## 2012-03-10 ENCOUNTER — Encounter: Payer: Self-pay | Admitting: Family Medicine

## 2012-03-10 ENCOUNTER — Ambulatory Visit (INDEPENDENT_AMBULATORY_CARE_PROVIDER_SITE_OTHER): Payer: Worker's Compensation | Admitting: Family Medicine

## 2012-03-10 VITALS — BP 152/111 | HR 93 | Temp 99.1°F | Ht 63.0 in | Wt 184.0 lb

## 2012-03-10 DIAGNOSIS — M1990 Unspecified osteoarthritis, unspecified site: Secondary | ICD-10-CM

## 2012-03-10 DIAGNOSIS — M25571 Pain in right ankle and joints of right foot: Secondary | ICD-10-CM

## 2012-03-10 DIAGNOSIS — M129 Arthropathy, unspecified: Secondary | ICD-10-CM

## 2012-03-10 DIAGNOSIS — M25579 Pain in unspecified ankle and joints of unspecified foot: Secondary | ICD-10-CM

## 2012-03-10 MED ORDER — HYDROCODONE-ACETAMINOPHEN 5-325 MG PO TABS
1.0000 | ORAL_TABLET | Freq: Two times a day (BID) | ORAL | Status: DC | PRN
Start: 2012-03-10 — End: 2012-04-28

## 2012-03-10 NOTE — Assessment & Plan Note (Addendum)
DJD of RT ankle secondary to previous fracture in 2012 (worker's comp injury).  Since then patient has had chronic ankle pain and now bilateral knee pain.  Patient says she was supposed to see Dr. Luiz Blare at the the time of injury, but did not have insurance.  She is interested in Ortho referral today.  Will treat pain with Norco PRN for severe pain.  Tramadol did not help and her BP is elevated today, so will avoid NSAIDS at this time.  Discussed pain contract with patient.  She agrees with plan.  Follow up with either Ortho or myself in one month or sooner as needed.  May benefit from PT, but will see what Ortho wants to do first.

## 2012-03-10 NOTE — Patient Instructions (Addendum)
It was good to see you today, Brittany Jimenez. Your blood pressure is elevated today, but it could be due to pain. Avoid over the counter Motrin and other NSAID for now. Pick up Norco and take one tablet 1-2 times per day as needed for severe pain. Schedule follow up appointment with me in ONE month.

## 2012-03-10 NOTE — Progress Notes (Signed)
  Subjective:    Patient ID: Brittany Jimenez, female    DOB: September 26, 1965, 47 y.o.   MRN: [id number]  HPI  Patient presents to follow up for bilateral leg pain.  Pain is located RT ankle and bilateral knees, LT worse than RT.  Pain starts at knee and radiates to mid-thigh.  Pain is sharp and intermittent.  She has tried Tramadol the last 2 months and says it did nothing for pain.  She has also tried OTC Motrin which makes her sleepy but does not ease the pain.  Complains tingling, sharp burning sensation at bottom of feet bilaterally.  This is intermittent throughout the day.   Patient requesting to be seen by Orthopedic physician.  She also asking for refill Norco 5-325.  She has been getting this from ER and takes 1-2 tablets, but does not need to take it everyday.   X-ray images of both ankles and knees reviewed from 2012.  RT ankle DJD.  Review of Systems Per HPI    Objective:   Physical Exam  Constitutional: She appears well-nourished. No distress.  Musculoskeletal:       Left knee: She exhibits normal range of motion, no swelling, no effusion, no ecchymosis and no erythema. No tenderness found.       Right ankle: She exhibits decreased range of motion. She exhibits no swelling, no ecchymosis, no deformity and normal pulse. Tenderness.  Neurological: She has normal strength. No sensory deficit. Gait abnormal.  Walks with slight limp.      Assessment & Plan:

## 2012-04-10 ENCOUNTER — Encounter (HOSPITAL_COMMUNITY): Payer: Self-pay | Admitting: Emergency Medicine

## 2012-04-10 ENCOUNTER — Emergency Department (HOSPITAL_COMMUNITY)
Admission: EM | Admit: 2012-04-10 | Discharge: 2012-04-10 | Disposition: A | Discharge status: Home / Self Care | Payer: No Typology Code available for payment source | Attending: Emergency Medicine | Admitting: Emergency Medicine

## 2012-04-10 DIAGNOSIS — F3289 Other specified depressive episodes: Secondary | ICD-10-CM | POA: Insufficient documentation

## 2012-04-10 DIAGNOSIS — I10 Essential (primary) hypertension: Secondary | ICD-10-CM | POA: Insufficient documentation

## 2012-04-10 DIAGNOSIS — Z79899 Other long term (current) drug therapy: Secondary | ICD-10-CM | POA: Insufficient documentation

## 2012-04-10 DIAGNOSIS — F17200 Nicotine dependence, unspecified, uncomplicated: Secondary | ICD-10-CM | POA: Insufficient documentation

## 2012-04-10 DIAGNOSIS — Z8739 Personal history of other diseases of the musculoskeletal system and connective tissue: Secondary | ICD-10-CM | POA: Insufficient documentation

## 2012-04-10 DIAGNOSIS — F329 Major depressive disorder, single episode, unspecified: Secondary | ICD-10-CM | POA: Insufficient documentation

## 2012-04-10 DIAGNOSIS — J45909 Unspecified asthma, uncomplicated: Secondary | ICD-10-CM | POA: Insufficient documentation

## 2012-04-10 DIAGNOSIS — M25569 Pain in unspecified knee: Secondary | ICD-10-CM | POA: Insufficient documentation

## 2012-04-10 DIAGNOSIS — M25579 Pain in unspecified ankle and joints of unspecified foot: Secondary | ICD-10-CM | POA: Insufficient documentation

## 2012-04-10 DIAGNOSIS — E669 Obesity, unspecified: Secondary | ICD-10-CM | POA: Insufficient documentation

## 2012-04-10 DIAGNOSIS — G8929 Other chronic pain: Secondary | ICD-10-CM | POA: Insufficient documentation

## 2012-04-10 MED ORDER — HYDROCODONE-ACETAMINOPHEN 5-325 MG PO TABS: ORAL_TABLET | ORAL | Status: DC

## 2012-04-10 NOTE — ED Provider Notes (Signed)
History     CSN: [id number]  Arrival date & time 04/10/12  0940   First MD Initiated Contact with Patient 04/10/12 [phone]      Chief Complaint  Patient presents with  . Ankle Pain  . Knee Pain    (Consider location/radiation/quality/duration/timing/severity/associated sxs/prior treatment) HPI  Brittany Jimenez is a 47 y.o. female complaining of bilateral ankle and knee pain. Patient states that she had a slip and fall 2 weeks ago pain exacerbation in the left knee. She's had chronic pain and normally takes Norco. As per family practice note, patient was seen one month ago given 30 tabs of Norco advised to followup in one month and also advised to follow with orthopedist. Patient states that she does not yet have an appointment with an orthopedist. Patient states that she is ran out of her pain medication. Patient denies swelling, difficulty ambulating. She describes her pain as 10 out of 10, aching and sharp it is exacerbated by movement and weight bearing  Past Medical History  Diagnosis Date  . Obesity   . Chronic pain   . Depression   . Arthritis   . Asthma   . Hypertension     Past Surgical History  Procedure Laterality Date  . Appendectomy    . Tubal ligation  1995    Family History  Problem Relation Age of Onset  . Heart disease Mother   . Early death Mother   . Asthma Mother   . Diabetes Mother   . Hypertension Mother   . Cancer Father   . Alcohol abuse Father   . Early death Father   . Hypertension Father   . Asthma Sister   . Cancer Sister   . Hypertension Sister   . Depression Brother   . Drug abuse Brother   . Hypertension Brother     History  Substance Use Topics  . Smoking status: Current Every Day Smoker -- 0.50 packs/day    Types: Cigarettes  . Smokeless tobacco: Not on file  . Alcohol Use: No    OB History   Grav Para Term Preterm Abortions TAB SAB Ect Mult Living                  Review of Systems  Constitutional: Negative for  fever.  Respiratory: Negative for shortness of breath.   Cardiovascular: Negative for chest pain.  Gastrointestinal: Negative for nausea, vomiting, abdominal pain and diarrhea.  Musculoskeletal: Positive for arthralgias.  All other systems reviewed and are negative.    Allergies  Review of patient's allergies indicates no known allergies.  Home Medications   Current Outpatient Rx  Name  Route  Sig  Dispense  Refill  . albuterol (PROVENTIL HFA;VENTOLIN HFA) 108 (90 BASE) MCG/ACT inhaler   Inhalation   Inhale 2 puffs into the lungs every 6 (six) hours as needed. For shortness of breath         . buPROPion (WELLBUTRIN) 100 MG tablet   Oral   Take 200 mg by mouth 2 (two) times daily.         . DULoxetine (CYMBALTA) 30 MG capsule   Oral   Take 30 mg by mouth 2 (two) times daily.          Marland Kitchen HYDROcodone-acetaminophen (NORCO/VICODIN) 5-325 MG per tablet   Oral   Take 1 tablet by mouth 2 (two) times daily as needed for pain.   30 tablet   0   . LORazepam (ATIVAN) 1 MG tablet  Oral   Take 1 mg by mouth every 8 (eight) hours as needed. For anxiety         . nitrofurantoin, macrocrystal-monohydrate, (MACROBID) 100 MG capsule   Oral   Take 1 capsule (100 mg total) by mouth 2 (two) times daily.   10 capsule   0   . traMADol (ULTRAM) 50 MG tablet   Oral   Take 1 tablet (50 mg total) by mouth every 12 (twelve) hours as needed for pain.   60 tablet   0   . zolpidem (AMBIEN) 5 MG tablet   Oral   Take 5 mg by mouth at bedtime as needed. For sleep           BP 148/97  Pulse 76  Temp(Src) 97.9 F (36.6 C) (Oral)  Resp 16  SpO2 100%  Physical Exam  Nursing note and vitals reviewed. Constitutional: She is oriented to person, place, and time. She appears well-developed and well-nourished. No distress.  HENT:  Head: Normocephalic.  Eyes: Conjunctivae and EOM are normal.  Cardiovascular: Normal rate.   Pulmonary/Chest: Effort normal. No stridor.   Musculoskeletal: Normal range of motion.  Patient ambulates with a coordinated in nonantalgic gait.  Bilateral knees show no effusion, full active range of motion, stable to valgus and varus stress, no abnormal laxity on anterior posterior drawer. Joint lines are nontender. Mild crepitus bilaterally.  Bilateral ankles show full range of motion, no swelling, erythema, tenderness to palpation.  Neurovascularly intact.  Neurological: She is alert and oriented to person, place, and time.  Psychiatric: She has a normal mood and affect.    ED Course  Procedures (including critical care time)  Labs Reviewed - No data to display No results found.   1. Chronic pain       MDM   Brittany Jimenez is a 47 y.o. female presenting with chronic pain and requesting medication refill. Patient did have a slip and fall 2 weeks ago. There are no signs of any acute trauma. Advised patient that she will need to follow with primary care for further management of chronic pain and that they can only give her several days of pain medication in the ED. Patient voiced her understanding.   Filed Vitals:   04/10/12 0945  BP: 148/97  Pulse: 76  Temp: 97.9 F (36.6 C)  TempSrc: Oral  Resp: 16  SpO2: 100%     Pt verbalized understanding and agrees with care plan. Outpatient follow-up and return precautions given.    New Prescriptions   HYDROCODONE-ACETAMINOPHEN (NORCO/VICODIN) 5-325 MG PER TABLET    Take 1-2 tablets by mouth every 6 hours as needed for pain.           Wynetta Emery, PA-C 04/10/12 1008

## 2012-04-10 NOTE — ED Notes (Signed)
Bilateral ankle pain and knee pain worsening overtime.  States slip and fell  2 weeks ago and waiting for orthopedic appointment.

## 2012-04-11 NOTE — ED Provider Notes (Signed)
Medical screening examination/treatment/procedure(s) were performed by non-physician practitioner and as supervising physician I was immediately available for consultation/collaboration.    Tegan Britain D Roswell Ndiaye, MD 04/11/12 1626 

## 2012-04-15 ENCOUNTER — Ambulatory Visit (INDEPENDENT_AMBULATORY_CARE_PROVIDER_SITE_OTHER): Payer: No Typology Code available for payment source | Admitting: Family Medicine

## 2012-04-15 VITALS — BP 160/100 | HR 71 | Ht 63.0 in | Wt 188.8 lb

## 2012-04-15 DIAGNOSIS — M1990 Unspecified osteoarthritis, unspecified site: Secondary | ICD-10-CM

## 2012-04-15 DIAGNOSIS — M129 Arthropathy, unspecified: Secondary | ICD-10-CM

## 2012-04-15 MED ORDER — GABAPENTIN 300 MG PO CAPS: 300.0000 mg | ORAL_CAPSULE | Freq: Three times a day (TID) | ORAL | Status: DC

## 2012-04-15 NOTE — Progress Notes (Signed)
Subjective:     Patient ID: Brittany Jimenez, female   DOB: 1965/09/12, 47 y.o.   MRN: [id number]  HPI 47 year old female presents for follow.  Patient has recently been seen in the ED for Pain (Left knee, Right ankle)  1) Pain - Primarily Right ankle and Left ankle - Pain has been present for approximately a year and developed after fracture of Right ankle - Patient recently seen by Dr. Tye Savoy and was given Vicodin for pain with no improvement/relief. - Seen in the ED on 3/29 for knee and ankle pain and had ran out of medication.  Per ED note, she suffered a fall and worsened her left knee pain. - Today patient endorses left knee and ankle pain - severe with associated intermittent tingling and sharp pain.  Worse with physical activity/motion.  No relieving factors.  - Reports that she wears an ankle brace constantly which provides some relief. Vicodin does not improve her pain.  Requesting Percocet today as this resolves her pain. ROS: no fevers, chills.  Reports some ankle swelling with activity.  No erythema noted.  No numbness. Decreased ROM reported.  Review of Systems See HPI    Objective:   Physical Exam General: well appearing lady in NAD. MSK:  Right ankle: ROM reduced secondary to pain.  No swelling or erythema.   Left knee: full ROM although patient resists PROM.  Negative anterior and posterior drawer.  Negative McMurray. No erythema, swelling or effusion appreciated.  Pain reported on palpation of tibial tuberosity.     Assessment:         Plan:

## 2012-04-15 NOTE — Assessment & Plan Note (Signed)
I reviewed patient's xray findings which showed degenerative changes of the right ankle and mild medial space narrowing of the left knee.    Pain is currently not well controlled and is still severe. Ortho referral has not been completed as patient has Orange card and this will take at least 1 or more months.Given limited ROM and prior benefit of PT, Rx given for Physical therapy.  Rx sent for gabapentin to see if this will improved patient's pain.  I did not feel comfortable refilling Vicodin or giving Percocet at this time as physical exam findings are not consistent with severity of pain.  Will defer use of narcotic agents to Dr. Tye Savoy. Follow up arranged with Dr. Tye Savoy in 2 weeks.

## 2012-04-15 NOTE — Patient Instructions (Addendum)
It was nice meeting you today.  Please be sure to take the Gabapentin three times a day for pain.  You may also take tylenol daily (up to 4000 mg).  Physical therapy will also be of benefit.  [facility] accepts the orange card ([address] [place], [place], [place] [address] [phone])  Be sure to keep your appointment with Dr. Tye Savoy to discuss pain management.

## 2012-04-28 ENCOUNTER — Ambulatory Visit (INDEPENDENT_AMBULATORY_CARE_PROVIDER_SITE_OTHER): Payer: No Typology Code available for payment source | Admitting: Family Medicine

## 2012-04-28 ENCOUNTER — Encounter: Payer: Self-pay | Admitting: Family Medicine

## 2012-04-28 VITALS — BP 150/87 | HR 83 | Temp 99.4°F | Ht 63.0 in | Wt 189.0 lb

## 2012-04-28 DIAGNOSIS — I10 Essential (primary) hypertension: Secondary | ICD-10-CM

## 2012-04-28 DIAGNOSIS — M25562 Pain in left knee: Secondary | ICD-10-CM | POA: Insufficient documentation

## 2012-04-28 DIAGNOSIS — M25569 Pain in unspecified knee: Secondary | ICD-10-CM

## 2012-04-28 MED ORDER — TRAMADOL HCL 50 MG PO TABS: 50.0000 mg | ORAL_TABLET | Freq: Four times a day (QID) | ORAL | Status: DC | PRN

## 2012-04-28 NOTE — Assessment & Plan Note (Signed)
Chronic pain secondary to old injury 2012.  Patient has orange card, so it will be awhile until seen by Ortho.  She will be going to a chiropractor for rehab until then.  Pain control with Tramadol 1-2 tablets q6 hr prn pain.  Will hold off on narcotics for now, they do not seem to be helping.

## 2012-04-28 NOTE — Patient Instructions (Addendum)
It was good to see you today, Brittany Jimenez. Continue to go to the chiropractor to help with strengthening of your knee. For pain, take Tramadol 50-100 mg (1-2 tablets) every 6 hours as needed. We will call you with time and date of Orthopedic Referral in 1-2 months.  For high blood pressure, please schedule follow up appointment with me in 1-2 weeks.

## 2012-04-28 NOTE — Assessment & Plan Note (Signed)
BP was normal 6 months ago, not on any medication for it.  BP elevated today likely due to pain.  Will discuss BP with patient at next visit in 1-2 weeks.

## 2012-04-28 NOTE — Progress Notes (Signed)
  Subjective:    Patient ID: Brittany Jimenez, female    DOB: Aug 27, 1965, 47 y.o.   MRN: [id number]  HPI  Left knee pain: Has been going on since falling into floor drain since Feb. 2012. She was seen in ED at that time and was referred to Orthopedic Surgery, but she did not have insurance. She continues to have trouble with left knee pain, described as throbbing. Pain is constant, not relieved by rest. She was seen by Dr. Adriana Simas 2 weeks ago and given Rx for Gabapentin, but patient never picked it up due side effect anxiety. Patient has not taken any OTC medications for pain.  She has tried Vicodin without any relief. Has had knee injections on left in 2012, but pain relief only lasted "about one minute." Denies any paresthesias of LE.  Denies any associated fever, vomiting, or chills.  Review of Systems Per HPI    Objective:   Physical Exam Vitals reviewed. General: in NAD, but anxious appearing MSK: Left knee: LT knee is more swollen than RT, but no erythema or warmth.  Full ROM with flexion and extension of bilateral knees.  Positive locking and crepitus with flexion likely secondary to chondromalacia.  Negative Lachman's.  Negative McMurray.  Negative patellar traction.   Neuro: 5/5 motor strength and sensation of B/L LE     Assessment & Plan:

## 2012-05-13 ENCOUNTER — Ambulatory Visit (INDEPENDENT_AMBULATORY_CARE_PROVIDER_SITE_OTHER): Payer: No Typology Code available for payment source | Admitting: Family Medicine

## 2012-05-13 ENCOUNTER — Encounter: Payer: Self-pay | Admitting: Family Medicine

## 2012-05-13 VITALS — BP 156/98 | HR 81 | Temp 99.7°F | Ht 63.0 in | Wt 187.5 lb

## 2012-05-13 DIAGNOSIS — E669 Obesity, unspecified: Secondary | ICD-10-CM

## 2012-05-13 DIAGNOSIS — M25569 Pain in unspecified knee: Secondary | ICD-10-CM

## 2012-05-13 DIAGNOSIS — M25562 Pain in left knee: Secondary | ICD-10-CM

## 2012-05-13 DIAGNOSIS — I10 Essential (primary) hypertension: Secondary | ICD-10-CM

## 2012-05-13 LAB — COMPREHENSIVE METABOLIC PANEL
AST: 16 U/L (ref 0–37)
Albumin: 4.2 g/dL (ref 3.5–5.2)
Alkaline Phosphatase: 86 U/L (ref 39–117)
BUN: 13 mg/dL (ref 6–23)
Glucose, Bld: 82 mg/dL (ref 70–99)
Potassium: 4.2 mEq/L (ref 3.5–5.3)
Sodium: 138 mEq/L (ref 135–145)
Total Bilirubin: 0.3 mg/dL (ref 0.3–1.2)
Total Protein: 7.2 g/dL (ref 6.0–8.3)

## 2012-05-13 MED ORDER — HYDROCHLOROTHIAZIDE 12.5 MG PO TABS: 25.0000 mg | ORAL_TABLET | Freq: Every day | ORAL | Status: DC

## 2012-05-13 MED ORDER — MELOXICAM 15 MG PO TABS: 15.0000 mg | ORAL_TABLET | Freq: Every day | ORAL | Status: DC

## 2012-05-13 NOTE — Assessment & Plan Note (Signed)
BP elevated again today.  Will start HCTZ 25 mg daily and recheck BP in one month.

## 2012-05-13 NOTE — Assessment & Plan Note (Signed)
Patient has been going to chiropractor but still complains of knee pain bilaterally, left is worse.  Tramadol made her anxious so she stopped taking it.  Will try Mobic daily x 15 days and icy hot topical.  Referral for Ortho in process.  Return in one month or sooner as needed.

## 2012-05-13 NOTE — Addendum Note (Signed)
Addended by: Tye Savoy, IVY on: 05/13/2012 04:18 PM   Modules accepted: Orders

## 2012-05-13 NOTE — Progress Notes (Signed)
  Subjective:    Patient ID: Brittany Jimenez, female    DOB: 1965-09-08, 47 y.o.   MRN: [id number]  HPI  Follow up appointment to discuss high blood pressure and knee pain.  Has been going on since falling into floor drain since Feb. 2012.  She was seen in ED at that time and was referred to Orthopedic Surgery, but she did not have insurance.  She continues to have trouble with left knee pain, described as throbbing.  Pain is constant, not relieved by rest.  She tried Tramadol with no relief.  She has not tried Ibuprofen and Tylenol in the past without relief.    BP is elevated second time in the last 4 weeks.  She has never been on BP medications in the past. She does not eat  high sodium foods, but does cook with seasoning salts.  She does not exercise much due to chronic knee pain.   She denies any HA, lower extremity edema, fatigue, or weakness.  Review of Systems Per HPI    Objective:   Physical Exam  Constitutional: She appears well-nourished. No distress.  Cardiovascular: Normal rate.   Pulmonary/Chest: Effort normal and breath sounds normal.  Musculoskeletal:  RT knee: crepitus with flexion and extension consistent with chondromalacia, full ROM; no tenderness on palpation of medial or lateral joint line; no effusion; stable ligaments LT knee: small effusion, but no erythema; pain on palpation diffusely; crepitus with flexion and extension; stable ligaments       Assessment & Plan:

## 2012-05-13 NOTE — Patient Instructions (Addendum)
Please purchase Muscle Rub or ICY HOT over the counter. Pick up blood pressure medication and pain medication at your pharmacy. I will send a letter with your results to you and [facility]. I will look into your referral status. Schedule follow up appointment with me in June.

## 2012-05-17 ENCOUNTER — Encounter: Payer: Self-pay | Admitting: Family Medicine

## 2012-05-17 ENCOUNTER — Telehealth: Payer: Self-pay | Admitting: Family Medicine

## 2012-05-17 NOTE — Telephone Encounter (Signed)
Brittany Jimenez, chiropractor visits have not seemed to help patient.  She would still like to see an orthopedic surgeon.  Thank you.

## 2012-06-16 ENCOUNTER — Ambulatory Visit: Payer: Self-pay | Admitting: Family Medicine

## 2012-06-21 ENCOUNTER — Encounter: Payer: Self-pay | Admitting: Family Medicine

## 2012-06-21 ENCOUNTER — Ambulatory Visit (INDEPENDENT_AMBULATORY_CARE_PROVIDER_SITE_OTHER): Payer: No Typology Code available for payment source | Admitting: Family Medicine

## 2012-06-21 VITALS — BP 142/76 | HR 73 | Temp 98.7°F | Ht 63.0 in | Wt 193.7 lb

## 2012-06-21 DIAGNOSIS — I10 Essential (primary) hypertension: Secondary | ICD-10-CM

## 2012-06-21 DIAGNOSIS — M25571 Pain in right ankle and joints of right foot: Secondary | ICD-10-CM

## 2012-06-21 DIAGNOSIS — M25579 Pain in unspecified ankle and joints of unspecified foot: Secondary | ICD-10-CM

## 2012-06-21 LAB — URIC ACID: Uric Acid, Serum: 4.3 mg/dL (ref 2.4–7.0)

## 2012-06-21 MED ORDER — HYDROCHLOROTHIAZIDE 25 MG PO TABS: 25.0000 mg | ORAL_TABLET | Freq: Every day | ORAL | Status: DC

## 2012-06-21 MED ORDER — PREDNISONE 50 MG PO TABS: 50.0000 mg | ORAL_TABLET | Freq: Every day | ORAL | Status: DC

## 2012-06-21 MED ORDER — BUPROPION HCL ER (XL) 150 MG PO TB24: 150.0000 mg | ORAL_TABLET | Freq: Every day | ORAL | Status: DC

## 2012-06-21 NOTE — Progress Notes (Signed)
  Subjective:    Patient here for follow-up of elevated blood pressure.  She is not exercising and is adherent to a low-salt diet.  BP has improved today, but she has been out of HCTZ for about 2 weeks.  She was given a Rx for HCTZ 12.5 BID and only had enough for 2 weeks.  Patient denies: chest pain, dyspnea, fatigue and syncope. Cardiovascular risk factors: none. Use of agents associated with hypertension: NSAIDS - she takes Mobic as needed for arthritis.  RT ankle pain: This has been going on for 2 weeks.  Pain starts at ankle and radiates up to lower calf.  Denies any trauma or injury.  She also complains of ankle swelling.  RT ankle becomes red intermittently, but this is better today.Marland Kitchen  Describes pain as burning sensation, but no numbness or diminished sensation.  She says she has been told she had gout a few years ago.  Review of Systems Per HPI   Objective:    BP 142/76  Pulse 73  Temp(Src) 98.7 F (37.1 C) (Oral)  Ht 5\' 3"  (1.6 m)  Wt 193 lb 11.2 oz (87.862 kg)  BMI 34.32 kg/m2 Neck: supple, symmetrical, trachea midline Lungs: clear to auscultation bilaterally Heart: regular rate and rhythm, S1, S2 normal, no murmur, click, rub or gallop Extremities: Ankle, RIGHT: No visible erythema, but there is mild swelling and warmth. Range of motion is full in all directions. Strength is 5/5 in all directions. Negative calf tenderness. No sign of peroneal tendon subluxations; Able to walk 4 steps.   Assessment:    Hypertension, RT ankle pain.   Plan:    See Problem List.

## 2012-06-21 NOTE — Assessment & Plan Note (Signed)
RT ankle pain could be secondary to arthritis, but she says she has had gout in the past.  Will check uric acid level today.  Treat with Prednisone 50 mg x 5 days.  Follow up as needed if pain does not improve.

## 2012-06-21 NOTE — Patient Instructions (Addendum)
We will check a uric acid level today to check for gout. For pain, take Prednisone one tablet daily x 5 days. For blood pressure, take HCTZ 25 mg daily. Schedule follow up appointment with your new doctor in ONE month.  It was a pleasure getting to know you.  Thank you.

## 2012-06-21 NOTE — Assessment & Plan Note (Signed)
Sent in Rx for HCTZ 25 mg daily.  Recheck BP in one month.

## 2012-06-29 ENCOUNTER — Encounter: Payer: Self-pay | Admitting: Family Medicine

## 2012-06-29 ENCOUNTER — Ambulatory Visit (INDEPENDENT_AMBULATORY_CARE_PROVIDER_SITE_OTHER): Payer: No Typology Code available for payment source | Admitting: Family Medicine

## 2012-06-29 VITALS — BP 119/77 | HR 61 | Temp 98.3°F | Wt 193.0 lb

## 2012-06-29 DIAGNOSIS — N938 Other specified abnormal uterine and vaginal bleeding: Secondary | ICD-10-CM

## 2012-06-29 DIAGNOSIS — N949 Unspecified condition associated with female genital organs and menstrual cycle: Secondary | ICD-10-CM

## 2012-06-29 LAB — POCT HEMOGLOBIN: Hemoglobin: 14.4 g/dL (ref 12.2–16.2)

## 2012-06-29 MED ORDER — MEDROXYPROGESTERONE ACETATE 10 MG PO TABS: 10.0000 mg | ORAL_TABLET | Freq: Two times a day (BID) | ORAL | Status: DC

## 2012-06-29 NOTE — Progress Notes (Signed)
  Subjective:    Patient ID: Brittany Jimenez, female    DOB: [birth year], 47 y.o.   MRN: [id number]  HPI  Brittany Jimenez comes in complaining of vaginal bleeding x 2 weeks. She says her period started on the 3rd, tapered off a little after a few days, but then started again and has been very heavy.  She says she has to change her pad ever 4 hours or so.  She endorses some problems with feeling light headed and weak.  She has had some moderate intensity cramping with the bleeding. She has never had any problems with irregular bleeding before.  Denies STD's exposure, has had tubal ligation.  Her mom had a hysterectomy so she did not have regular menopause.   Review of Systems See HPI    Objective:   Physical Exam BP 119/77  Pulse 61  Temp(Src) 98.3 F (36.8 C) (Oral)  Wt 193 lb (87.544 kg)  BMI 34.2 kg/m2 General appearance: alert, cooperative and no distress Abd: Mild suprapubic tenderness, otherwise normal bowel sounds, soft, and non-tender.       Assessment & Plan:

## 2012-06-29 NOTE — Patient Instructions (Addendum)
Perimenopause Perimenopause is the time when your body begins to move into the menopause (no menstrual period for 12 straight months). It is a natural process. Perimenopause can begin 2 to 8 years before the menopause and usually lasts for one year after the menopause. During this time, your ovaries may or may not produce an egg. The ovaries vary in their production of estrogen and progesterone hormones each month. This can cause irregular menstrual periods, difficulty in getting pregnant, vaginal bleeding between periods and uncomfortable symptoms. CAUSES  Irregular production of the ovarian hormones, estrogen and progesterone, and not ovulating every month.  Other causes include:  Tumor of the pituitary gland in the brain.  Medical disease that affects the ovaries.  Radiation treatment.  Chemotherapy.  Unknown causes.  Heavy smoking and excessive alcohol intake can bring on perimenopause sooner. SYMPTOMS   Hot flashes.  Night sweats.  Irregular menstrual periods.  Decrease sex drive.  Vaginal dryness.  Headaches.  Mood swings.  Depression.  Memory problems.  Irritability.  Tiredness.  Weight gain.  Trouble getting pregnant.  The beginning of losing bone cells (osteoporosis).  The beginning of hardening of the arteries (atherosclerosis). DIAGNOSIS  Your caregiver will make a diagnosis by analyzing your age, menstrual history and your symptoms. They will do a physical exam noting any changes in your body, especially your female organs. Female hormone tests may or may not be helpful depending on the amount and when you produce the female hormones. However, other hormone tests may be helpful (ex. thyroid hormone) to rule out other problems. TREATMENT  The decision to treat during the perimenopause should be made by you and your caregiver depending on how the symptoms are affecting you and your life style. There are various treatments available such as:  Treating  individual symptoms with a specific medication for that symptom (ex. tranquilizer for depression).  Herbal medications that can help specific symptoms.  Counseling.  Group therapy.  No treatment. HOME CARE INSTRUCTIONS   Before seeing your caregiver, make a list of your menstrual periods (when the occur, how heavy they are, how long between periods and how long they last), your symptoms and when they started.  Take the medication as recommended by your caregiver.  Sleep and rest.  Exercise.  Eat a diet that contains calcium (good for your bones) and soy (acts like estrogen hormone).  Do not smoke.  Avoid alcoholic beverages.  Taking vitamin E may help in certain cases.  Take calcium and vitamin D supplements to help prevent bone loss.  Group therapy is sometimes helpful.  Acupuncture may help in some cases. SEEK MEDICAL CARE IF:   You have any of the above and want to know if it is perimenopause.  You want advice and treatment for any of your symptoms mentioned above.  You need a referral to a specialist (gynecologist, psychiatrist or psychologist). SEEK IMMEDIATE MEDICAL CARE IF:   You have vaginal bleeding.  Your period lasts longer than 8 days.  You periods are recurring sooner than 21 days.  You have bleeding after intercourse.  You have severe depression.  You have pain when you urinate.  You have severe headaches.  You develop vision problems. Document Released: 02/07/2004 Document Revised: 03/24/2011 Document Reviewed: 10/28/2007 Exit[employer] Patient Information 2014 [employer].  

## 2012-06-29 NOTE — Assessment & Plan Note (Signed)
Suspect patient is peri-menopausal.  Finger stick hemoglobin 14.  Rx for Provera 10 PO BID x 7 days.  Advised to follow up if not improving.

## 2012-07-23 ENCOUNTER — Ambulatory Visit: Payer: Self-pay | Admitting: Family Medicine

## 2012-07-27 ENCOUNTER — Ambulatory Visit: Payer: Self-pay | Admitting: Family Medicine

## 2012-08-02 ENCOUNTER — Encounter: Payer: Self-pay | Admitting: Family Medicine

## 2012-08-02 ENCOUNTER — Ambulatory Visit (HOSPITAL_COMMUNITY)
Admission: RE | Admit: 2012-08-02 | Discharge: 2012-08-02 | Disposition: A | Discharge status: Home / Self Care | Payer: No Typology Code available for payment source | Source: Ambulatory Visit | Attending: Family Medicine | Admitting: Family Medicine

## 2012-08-02 ENCOUNTER — Ambulatory Visit (INDEPENDENT_AMBULATORY_CARE_PROVIDER_SITE_OTHER): Payer: Worker's Compensation | Admitting: Family Medicine

## 2012-08-02 VITALS — BP 128/75 | HR 107 | Temp 98.5°F | Ht 63.0 in | Wt 184.3 lb

## 2012-08-02 DIAGNOSIS — M25579 Pain in unspecified ankle and joints of unspecified foot: Secondary | ICD-10-CM

## 2012-08-02 DIAGNOSIS — M25569 Pain in unspecified knee: Secondary | ICD-10-CM | POA: Insufficient documentation

## 2012-08-02 DIAGNOSIS — M25571 Pain in right ankle and joints of right foot: Secondary | ICD-10-CM

## 2012-08-02 DIAGNOSIS — I10 Essential (primary) hypertension: Secondary | ICD-10-CM

## 2012-08-02 DIAGNOSIS — M25562 Pain in left knee: Secondary | ICD-10-CM

## 2012-08-02 MED ORDER — ACETAMINOPHEN 500 MG PO TABS: 1000.0000 mg | ORAL_TABLET | Freq: Three times a day (TID) | ORAL | Status: DC

## 2012-08-02 MED ORDER — NAPROXEN 500 MG PO TABS: 500.0000 mg | ORAL_TABLET | Freq: Two times a day (BID) | ORAL | Status: DC

## 2012-08-02 MED ORDER — HYDROCHLOROTHIAZIDE 25 MG PO TABS: 25.0000 mg | ORAL_TABLET | Freq: Every day | ORAL | Status: DC

## 2012-08-02 MED ORDER — ALBUTEROL SULFATE HFA 108 (90 BASE) MCG/ACT IN AERS: 2.0000 | INHALATION_SPRAY | Freq: Four times a day (QID) | RESPIRATORY_TRACT | Status: DC | PRN

## 2012-08-02 NOTE — Progress Notes (Signed)
Subjective:     Patient ID: Brittany Jimenez, female   DOB: [birth year], 47 y.o.   MRN: [id number]  HPI 47 year old female presents for follow up.  1) HTN Disease Monitoring: Home BP Monitoring - No Chest pain- occassional    Dyspnea- No Medications: Compliance-  Yes, HCTZ Lightheadedness-  No  Edema- No   2) Right ankle pain & L knee pain - Pain has been present for at least a year and developed after fracture of Right ankle  - Patient has been followed by Dr. Tye Savoy for this.  Patient endorses her dissatisfaction with her care as she is still in pain and has not received an answer about the underlying etiology of her pain. - Today patient endorses continued left knee and ankle pain.  She reports the pain is severe and is associated with feelings that her joints are going to "give way".  Also endorses associated crepitus. Exacerbated by movement.  Improved by narcotics.  No radiation. - Reports that she wears an ankle brace constantly which provides some relief.  ROS: no fevers, chills. Reports some ankle and knee swelling with activity. No erythema noted. Decreased ROM reported.  Review of Systems Per HPI    Objective:   Physical Exam Filed Vitals:   08/02/12 1500  BP: 128/75  Pulse: 107  Temp: 98.5 F (36.9 C)   General: tearful when discussing her care. NAD. Heart: RRR. No m/r/g. Lungs: CTAB. MSK:  Right ankle: Good ROM in all planes. No swelling or erythema.  Left knee: Pain with internal rotation. Negative anterior and posterior drawer. Negative McMurray. No erythema, swelling or effusion appreciated.  Assessment:     See Problem list.    Plan:

## 2012-08-02 NOTE — Assessment & Plan Note (Signed)
Stable and at goal on HCTZ.

## 2012-08-02 NOTE — Assessment & Plan Note (Addendum)
I have reviewed the prior xray's.  Patient has mild narrowing of the medial joint compartment suggestive of Osteoarthritis. Repeat xray's ordered today given patients fall on 3/14 and continued severe pain. Will treat with scheduled tylenol and Naproxen.  If fails treatment will proceed to joint injection.

## 2012-08-02 NOTE — Assessment & Plan Note (Addendum)
I have reviewed the patient's prior xray's. No abnormalities noted by me.  Radiology reports mild degenerative changes. Repeat xray's ordered for today given continued pain and fall on 3/14. Will treat with scheduled tylenol and Naproxen.  If fails treatment will send to [facility] for injection of ankle.  Will also consider ortho referral.

## 2012-08-02 NOTE — Patient Instructions (Addendum)
It was nice seeing you today.  I have ordered xrays.  I will call you with the results.  Please take tylenol 1000 mg three times daily.  Please take the naproxen twice daily.  If your pain is not under control, we will proceed with injection and/or physical therapy.   Please follow up with me in 1-3 months.

## 2012-08-03 ENCOUNTER — Encounter: Payer: Self-pay | Admitting: Family Medicine

## 2012-09-03 ENCOUNTER — Ambulatory Visit: Payer: Self-pay | Admitting: Family Medicine

## 2012-11-26 ENCOUNTER — Telehealth: Payer: Self-pay | Admitting: Family Medicine

## 2012-11-26 NOTE — Telephone Encounter (Signed)
Patient would like a referral to the orthopedic doctor .

## 2012-11-26 NOTE — Telephone Encounter (Signed)
Patient has not been seen in our clinic yet. We need to see her to determine the need for referral

## 2012-11-29 ENCOUNTER — Telehealth: Payer: Self-pay | Admitting: *Deleted

## 2012-11-29 NOTE — Telephone Encounter (Signed)
Spoke with pt. She has new pt appt 12/03/12

## 2012-12-02 ENCOUNTER — Ambulatory Visit: Payer: Self-pay

## 2012-12-03 ENCOUNTER — Encounter: Payer: Self-pay | Admitting: Internal Medicine

## 2012-12-03 ENCOUNTER — Ambulatory Visit: Payer: No Typology Code available for payment source | Attending: Internal Medicine | Admitting: Internal Medicine

## 2012-12-03 ENCOUNTER — Encounter (INDEPENDENT_AMBULATORY_CARE_PROVIDER_SITE_OTHER): Payer: Self-pay

## 2012-12-03 VITALS — BP 139/87 | HR 69 | Temp 99.1°F | Resp 16 | Ht 63.0 in | Wt 175.0 lb

## 2012-12-03 DIAGNOSIS — M25579 Pain in unspecified ankle and joints of unspecified foot: Secondary | ICD-10-CM | POA: Insufficient documentation

## 2012-12-03 DIAGNOSIS — M25569 Pain in unspecified knee: Secondary | ICD-10-CM | POA: Insufficient documentation

## 2012-12-03 DIAGNOSIS — I10 Essential (primary) hypertension: Secondary | ICD-10-CM | POA: Insufficient documentation

## 2012-12-03 LAB — COMPLETE METABOLIC PANEL WITHOUT GFR
ALT: 14 U/L (ref 0–35)
AST: 16 U/L (ref 0–37)
Albumin: 4.2 g/dL (ref 3.5–5.2)
Alkaline Phosphatase: 75 U/L (ref 39–117)
BUN: 14 mg/dL (ref 6–23)
CO2: 28 meq/L (ref 19–32)
Calcium: 9.8 mg/dL (ref 8.4–10.5)
Chloride: 106 meq/L (ref 96–112)
Creat: 0.83 mg/dL (ref 0.50–1.10)
GFR, Est African American: >89 mL/min
GFR, Est Non African American: 83 mL/min
Glucose, Bld: 82 mg/dL (ref 70–99)
Potassium: 4 meq/L (ref 3.5–5.3)
Sodium: 141 meq/L (ref 135–145)
Total Bilirubin: 0.3 mg/dL (ref 0.3–1.2)
Total Protein: 7.3 g/dL (ref 6.0–8.3)

## 2012-12-03 LAB — SEDIMENTATION RATE: Sed Rate: 7 mm/h (ref 0–22)

## 2012-12-03 MED ORDER — ALBUTEROL SULFATE HFA 108 (90 BASE) MCG/ACT IN AERS: 2.0000 | INHALATION_SPRAY | Freq: Four times a day (QID) | RESPIRATORY_TRACT | Status: DC | PRN

## 2012-12-03 MED ORDER — MELOXICAM 15 MG PO TABS: 15.0000 mg | ORAL_TABLET | Freq: Every day | ORAL | Status: DC

## 2012-12-03 MED ORDER — TRAMADOL HCL 50 MG PO TABS: 50.0000 mg | ORAL_TABLET | Freq: Three times a day (TID) | ORAL | Status: DC | PRN

## 2012-12-03 MED ORDER — HYDROCHLOROTHIAZIDE 25 MG PO TABS: 25.0000 mg | ORAL_TABLET | Freq: Every day | ORAL | Status: DC

## 2012-12-03 NOTE — Progress Notes (Signed)
Pt is here to establish care. Pt was injured 2 years ago and hurt her right ankle  Last year she fell and hurt her left knee Pt needs her medication refilled. Pt is requesting a referral to an orthopedic doctor.

## 2012-12-03 NOTE — Progress Notes (Signed)
Patient ID: Brittany Jimenez, female   DOB: [birth year], 47 y.o.   MRN: [id number]   CC:  HPI:  47 year old female who presents with right ankle and left knee pain The patient was previously seen in family medicine for the same problem. She has had plain films of her right ankle that showed degenerated changes The patient claims that she has been bearing of the right ankle air brace for the last 2 years because of difficulty with pain and ambulation after her fracture in 2012  She also endorses left knee pain and is wearing a knee brace in both her legs. On examination the patient's left knee joint definitely appears widened without any effusion or swelling and redness. She has previously received narcotics, meloxicam, Naprosyn for this She states that she has a history of gout but her last uric acid in July was normal  She also has hypertension has not taken any of her medications for last one month    No Known Allergies Past Medical History  Diagnosis Date  . Obesity   . Chronic pain   . Depression   . Arthritis   . Asthma   . Hypertension    Current Outpatient Prescriptions on File Prior to Visit  Medication Sig Dispense Refill  . acetaminophen (TYLENOL) 500 MG tablet Take 2 tablets (1,000 mg total) by mouth 3 (three) times daily.  90 tablet  3  . buPROPion (WELLBUTRIN XL) 150 MG 24 hr tablet Take 1 tablet (150 mg total) by mouth daily.      Marland Kitchen buPROPion (WELLBUTRIN) 100 MG tablet Take 200 mg by mouth 2 (two) times daily.      . medroxyPROGESTERone (PROVERA) 10 MG tablet Take 1 tablet (10 mg total) by mouth 2 (two) times daily.  14 tablet  0  . naproxen (NAPROSYN) 500 MG tablet Take 1 tablet (500 mg total) by mouth 2 (two) times daily with a meal.  60 tablet  2   No current facility-administered medications on file prior to visit.   Family History  Problem Relation Age of Onset  . Heart disease Mother   . Early death Mother   . Asthma Mother   . Diabetes Mother   .  Hypertension Mother   . Cancer Father   . Alcohol abuse Father   . Early death Father   . Hypertension Father   . Asthma Sister   . Cancer Sister   . Hypertension Sister   . Depression Brother   . Drug abuse Brother   . Hypertension Brother    History   Social History  . Marital Status: Married    Spouse Name: N/A    Number of Children: N/A  . Years of Education: N/A   Occupational History  . Not on file.   Social History Main Topics  . Smoking status: Current Every Day Smoker -- 0.50 packs/day    Types: Cigarettes  . Smokeless tobacco: Not on file  . Alcohol Use: No  . Drug Use: No  . Sexual Activity: Yes    Birth Control/ Protection: Surgical   Other Topics Concern  . Not on file   Social History Narrative   Lives in [place] with husband, nephew, and one daughter.  Applying for disability currently.  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    Review of Systems  Constitutional: Negative for fever, chills, diaphoresis, activity change, appetite change and fatigue.  HENT: Negative for ear pain, nosebleeds, congestion, facial swelling, rhinorrhea, neck  pain, neck stiffness and ear discharge.   Eyes: Negative for pain, discharge, redness, itching and visual disturbance.  Respiratory: Negative for cough, choking, chest tightness, shortness of breath, wheezing and stridor.   Cardiovascular: Negative for chest pain, palpitations and leg swelling.  Gastrointestinal: Negative for abdominal distention.  Genitourinary: Negative for dysuria, urgency, frequency, hematuria, flank pain, decreased urine volume, difficulty urinating and dyspareunia.  Musculoskeletal: Negative for back pain, joint swelling, arthralgias and gait problem.  Neurological: Negative for dizziness, tremors, seizures, syncope, facial asymmetry, speech difficulty, weakness, light-headedness, numbness and headaches.  Hematological: Negative for adenopathy. Does not bruise/bleed easily.   Psychiatric/Behavioral: Negative for hallucinations, behavioral problems, confusion, dysphoric mood, decreased concentration and agitation.    Objective:   Filed Vitals:   12/03/12 1421  BP: 139/87  Pulse: 69  Temp: 99.1 F (37.3 C)  Resp: 16    Physical Exam  Constitutional: Appears well-developed and well-nourished. No distress.  HENT: Normocephalic. External right and left ear normal. Oropharynx is clear and moist.  Eyes: Conjunctivae and EOM are normal. PERRLA, no scleral icterus.  Neck: Normal ROM. Neck supple. No JVD. No tracheal deviation. No thyromegaly.  CVS: RRR, S1/S2 +, no murmurs, no gallops, no carotid bruit.  Pulmonary: Effort and breath sounds normal, no stridor, rhonchi, wheezes, rales.  Abdominal: Soft. BS +,  no distension, tenderness, rebound or guarding.  Musculoskeletal: Normal range of motion. No edema and no tenderness.  Lymphadenopathy: No lymphadenopathy noted, cervical, inguinal. Neuro: Alert. Normal reflexes, muscle tone coordination. No cranial nerve deficit. Skin: Skin is warm and dry. No rash noted. Not diaphoretic. No erythema. No pallor.  Psychiatric: Normal mood and affect. Behavior, judgment, thought content normal.   Lab Results  Component Value Date   WBC 8.0 05/22/2011   HGB 14.4 06/29/2012   HCT 40.0 05/22/2011   MCV 83.5 05/22/2011   PLT 204 05/22/2011   Lab Results  Component Value Date   CREATININE 1.04 05/13/2012   BUN 13 05/13/2012   NA 138 05/13/2012   K 4.2 05/13/2012   CL 104 05/13/2012   CO2 26 05/13/2012    Lab Results  Component Value Date   HGBA1C 4.9 05/13/2012   Lipid Panel  No results found for this basename: chol, trig, hdl, cholhdl, vldl, ldlcalc       Assessment and plan:   Patient Active Problem List   Diagnosis Date Noted  . DUB (dysfunctional uterine bleeding) 06/29/2012  . Pain in joint, ankle and foot 06/21/2012  . Pain in left knee 04/28/2012  . Arthritis 01/09/2012  . Asthma, mild intermittent 01/09/2012  .  Hypertension, essential, benign 01/09/2012  . Chronic pain        Ankle pain/knee pain We'll order an MRI of the right ankle The patient also had bilateral knee x-rays on 08/02/12 No acute abnormalities are seen but the patient definitely has visible joint deformity and crepitus on exam She probably has osteoarthritis We will refill her meloxicam The repeat uric acid, ESR  Hypertension Have refilled hydrochlorothiazide   Followup in 2 months  The patient was given clear instructions to go to ER or return to medical center if symptoms don't improve, worsen or new problems develop. The patient verbalized understanding. The patient was told to call to get any lab results if not heard anything in the next week.

## 2012-12-07 ENCOUNTER — Telehealth: Payer: Self-pay | Admitting: Internal Medicine

## 2012-12-07 NOTE — Telephone Encounter (Signed)
Patient would like to request an open MRI and medication for when she takes her MRI.

## 2012-12-07 NOTE — Telephone Encounter (Signed)
Pt requesting sedative for MRI- states she had a very bad experience from past imaging

## 2012-12-08 ENCOUNTER — Other Ambulatory Visit: Payer: Self-pay | Admitting: Internal Medicine

## 2012-12-08 MED ORDER — ALBUTEROL SULFATE HFA 108 (90 BASE) MCG/ACT IN AERS: 2.0000 | INHALATION_SPRAY | Freq: Four times a day (QID) | RESPIRATORY_TRACT | Status: DC | PRN

## 2012-12-13 ENCOUNTER — Telehealth: Payer: Self-pay | Admitting: Emergency Medicine

## 2012-12-13 ENCOUNTER — Other Ambulatory Visit: Payer: Self-pay | Admitting: Emergency Medicine

## 2012-12-13 ENCOUNTER — Telehealth: Payer: Self-pay | Admitting: Internal Medicine

## 2012-12-13 MED ORDER — LORAZEPAM 1 MG PO TABS: 1.0000 mg | ORAL_TABLET | ORAL | Status: DC

## 2012-12-13 NOTE — Telephone Encounter (Signed)
Pt prescribed Ativan 1mg  30 min prior procedure per Dr. Susie Cassette

## 2012-12-13 NOTE — Telephone Encounter (Signed)
Pt has MRI scheduled for 12/14/12 and would like to speak to nurse before appt.

## 2012-12-14 ENCOUNTER — Ambulatory Visit (HOSPITAL_COMMUNITY)
Admission: RE | Admit: 2012-12-14 | Discharge: 2012-12-14 | Disposition: A | Discharge status: Home / Self Care | Payer: No Typology Code available for payment source | Source: Ambulatory Visit | Attending: Internal Medicine | Admitting: Internal Medicine

## 2012-12-14 DIAGNOSIS — M25579 Pain in unspecified ankle and joints of unspecified foot: Secondary | ICD-10-CM | POA: Insufficient documentation

## 2012-12-14 DIAGNOSIS — I10 Essential (primary) hypertension: Secondary | ICD-10-CM

## 2012-12-14 DIAGNOSIS — M659 Unspecified synovitis and tenosynovitis, unspecified site: Secondary | ICD-10-CM | POA: Insufficient documentation

## 2012-12-27 ENCOUNTER — Encounter: Payer: Self-pay | Admitting: Sports Medicine

## 2012-12-27 ENCOUNTER — Ambulatory Visit (INDEPENDENT_AMBULATORY_CARE_PROVIDER_SITE_OTHER): Payer: No Typology Code available for payment source | Admitting: Sports Medicine

## 2012-12-27 VITALS — BP 132/85 | Ht 63.0 in | Wt 170.0 lb

## 2012-12-27 DIAGNOSIS — M25371 Other instability, right ankle: Secondary | ICD-10-CM

## 2012-12-27 DIAGNOSIS — M25569 Pain in unspecified knee: Secondary | ICD-10-CM

## 2012-12-27 DIAGNOSIS — M25562 Pain in left knee: Secondary | ICD-10-CM

## 2012-12-27 DIAGNOSIS — M24873 Other specific joint derangements of unspecified ankle, not elsewhere classified: Secondary | ICD-10-CM

## 2012-12-27 DIAGNOSIS — M25579 Pain in unspecified ankle and joints of unspecified foot: Secondary | ICD-10-CM

## 2012-12-27 DIAGNOSIS — M25571 Pain in right ankle and joints of right foot: Secondary | ICD-10-CM

## 2012-12-27 MED ORDER — METHYLPREDNISOLONE ACETATE 40 MG/ML IJ SUSP
40.0000 mg | Freq: Once | INTRAMUSCULAR | Status: AC
  Administered 2012-12-27: 40 mg via INTRA_ARTICULAR

## 2012-12-27 NOTE — Progress Notes (Signed)
Subjective:    Patient ID: Brittany Jimenez, female    DOB: May 19, 1965, 47 y.o.   MRN: [id number]  HPI chief complaint: Right ankle and left knee pain  47 year old female comes in today for evaluation of her right ankle and left knee. She suffered a distal fibular fracture of the right ankle in 2012. This was treated conservatively by Dr. Luiz Blare. Fracture has well healed but she has been left with chronic lateral ankle pain. She was provided with an ASO brace by Dr. Stacy Gardner office and she wears it regularly. She has recently had x-rays as well as an MRI scan of this ankle which are available for review. She also complains of diffuse left knee pain. She suffered a separate injury where she hyperextended the knee and since then has had intermittent pain and swelling. She has had x-rays of the left knee done but no other imaging. She has been placed on meloxicam but it has not been helpful. No prior surgery to either her knee or her ankle.  Past medical history and current medications are reviewed No known drug allergies    Review of Systems     Objective:   Physical Exam  Well-developed, well-nourished. No acute distress. Awake alert and oriented x3. Vital signs are reviewed  Right ankle: Limited range of motion secondary to pain. No significant soft tissue swelling. No joint effusion. There is tenderness to palpation directly over the ATF with a positive anterior drawer. 2+ talar tilt. No tenderness along the medial malleolus. Mild tenderness along the peroneal tendons but not marked. Good dorsalis pedis and posterior tibial pulses.  Left knee: Range of motion 0-90. Diffuse tenderness to palpation. No effusion. No erythema. Knee is grossly stable to ligamentous exam. Pain but no popping with McMurray's. Neurovascularly intact distally.  X-rays of the left knee dated 08/02/2012 are reviewed. No appreciable bony abnormality. No fracture. X-rays of her right ankle done 08/02/2012 are  reviewed. Mild posttraumatic degenerative changes. Distal fibular fracture is well-healed. MRI scan of the right ankle dated 12/14/2012 is reviewed. Dominant finding is chronic tearing of the ATFL. Mild peroneal tendinitis. Mild degenerative changes. No OCD.       Assessment & Plan:  1. Chronic right ankle pain status post healed distal fibular fracture, likely due to chronic ankle and early DJD 2. Left knee pain  For the right ankle I have sent the patient to physical therapy and I've asked her to start to wean from her med spec brace particularly when walking on even surfaces. I reassured her that she has no operative pathology in her ankle and that her fracture is well-healed. For the left knee we have elected to inject it with cortisone today. This was done atraumatically under sterile technique after risks and benefits were explained. An anterior medial approach was utilized. I've asked the patient to get an off the shelf compression sleeve and followup with me in 4 weeks. We did discuss the possibility of further diagnostic imaging in the form of an MRI if her left knee pain persists but she has no real insurance so finding an orthopedist to operate would be challenging. She can discontinue her meloxicam since it is ineffective. Call me with questions or concerns prior to her followup visit.  Consent obtained and verified. Time-out conducted. Noted no overlying erythema, induration, or other signs of local infection. Skin prepped in a sterile fashion. Topical analgesic spray: Ethyl chloride. Joint: left knee Needle: 25g 1.5 inch Completed without difficulty. Meds: 3cc 1%  xylocaine, 1cc (40mg ) depomedrol Advised to call if fevers/chills, erythema, induration, drainage, or persistent bleeding.

## 2012-12-29 ENCOUNTER — Ambulatory Visit: Payer: No Typology Code available for payment source | Attending: Sports Medicine

## 2012-12-29 DIAGNOSIS — M25676 Stiffness of unspecified foot, not elsewhere classified: Secondary | ICD-10-CM | POA: Insufficient documentation

## 2012-12-29 DIAGNOSIS — R262 Difficulty in walking, not elsewhere classified: Secondary | ICD-10-CM | POA: Insufficient documentation

## 2012-12-29 DIAGNOSIS — M25673 Stiffness of unspecified ankle, not elsewhere classified: Secondary | ICD-10-CM | POA: Insufficient documentation

## 2012-12-29 DIAGNOSIS — IMO0001 Reserved for inherently not codable concepts without codable children: Secondary | ICD-10-CM | POA: Insufficient documentation

## 2012-12-29 DIAGNOSIS — M25579 Pain in unspecified ankle and joints of unspecified foot: Secondary | ICD-10-CM | POA: Insufficient documentation

## 2013-01-27 ENCOUNTER — Encounter: Payer: Self-pay | Admitting: Sports Medicine

## 2013-01-27 ENCOUNTER — Ambulatory Visit (INDEPENDENT_AMBULATORY_CARE_PROVIDER_SITE_OTHER): Payer: No Typology Code available for payment source | Admitting: Sports Medicine

## 2013-01-27 VITALS — BP 121/83 | HR 71 | Ht 63.0 in | Wt 165.0 lb

## 2013-01-27 DIAGNOSIS — M25571 Pain in right ankle and joints of right foot: Secondary | ICD-10-CM

## 2013-01-27 DIAGNOSIS — M25579 Pain in unspecified ankle and joints of unspecified foot: Secondary | ICD-10-CM

## 2013-01-27 DIAGNOSIS — M25562 Pain in left knee: Secondary | ICD-10-CM

## 2013-01-27 DIAGNOSIS — M25569 Pain in unspecified knee: Secondary | ICD-10-CM

## 2013-01-27 NOTE — Progress Notes (Signed)
   Subjective:    Patient ID: Brittany Jimenez, female    DOB: Feb 15, 1965, 48 y.o.   MRN: [id number]  HPI Patient comes in today for followup on right ankle and left knee pain. Overall, she is still suffering with pain in both areas. She has only had a couple of sessions of physical therapy. Prior x-rays of her ankle showed some mild midfoot DJD. MRI scan showed chronic tearing of the ATFL with only some mild peroneal tendinitis. No OCD. Cortisone injection administered into the left knee provided only about one week's worth of symptom relief.    Review of Systems     Objective:   Physical Exam Well-developed, well-nourished. No acute distress   Left knee: Range of motion 0-120. No effusion. No soft tissue swelling. Good stability. Negative McMurray's.  Right ankle: Limited range of motion secondary to pain. No effusion. No soft tissue swelling. 2+ talar tilt, positive anterior drawer. Neurovascular intact distally.       Assessment & Plan:  Chronic right ankle pain secondary to early DJD Left knee pain  Patient has only had 2 physical therapy visits. I recommended that she return to physical therapy. This will be the mainstay of treatment for her ankle and her knee. Voltaren 75 mg twice daily to take as needed for pain and swelling. Once she has had a few more physical therapy visits then she will followup for reevaluation.

## 2013-01-29 ENCOUNTER — Emergency Department (HOSPITAL_COMMUNITY)
Admission: EM | Admit: 2013-01-29 | Discharge: 2013-01-29 | Disposition: A | Discharge status: Home / Self Care | Payer: No Typology Code available for payment source | Attending: Emergency Medicine | Admitting: Emergency Medicine

## 2013-01-29 ENCOUNTER — Encounter (HOSPITAL_COMMUNITY): Payer: Self-pay | Admitting: Emergency Medicine

## 2013-01-29 ENCOUNTER — Emergency Department (HOSPITAL_COMMUNITY): Payer: No Typology Code available for payment source

## 2013-01-29 DIAGNOSIS — R079 Chest pain, unspecified: Secondary | ICD-10-CM | POA: Insufficient documentation

## 2013-01-29 DIAGNOSIS — I10 Essential (primary) hypertension: Secondary | ICD-10-CM | POA: Insufficient documentation

## 2013-01-29 DIAGNOSIS — F3289 Other specified depressive episodes: Secondary | ICD-10-CM | POA: Insufficient documentation

## 2013-01-29 DIAGNOSIS — E669 Obesity, unspecified: Secondary | ICD-10-CM | POA: Insufficient documentation

## 2013-01-29 DIAGNOSIS — G8929 Other chronic pain: Secondary | ICD-10-CM | POA: Insufficient documentation

## 2013-01-29 DIAGNOSIS — F419 Anxiety disorder, unspecified: Secondary | ICD-10-CM

## 2013-01-29 DIAGNOSIS — F17200 Nicotine dependence, unspecified, uncomplicated: Secondary | ICD-10-CM | POA: Insufficient documentation

## 2013-01-29 DIAGNOSIS — Z79899 Other long term (current) drug therapy: Secondary | ICD-10-CM | POA: Insufficient documentation

## 2013-01-29 DIAGNOSIS — J45909 Unspecified asthma, uncomplicated: Secondary | ICD-10-CM | POA: Insufficient documentation

## 2013-01-29 DIAGNOSIS — F329 Major depressive disorder, single episode, unspecified: Secondary | ICD-10-CM | POA: Insufficient documentation

## 2013-01-29 DIAGNOSIS — F411 Generalized anxiety disorder: Secondary | ICD-10-CM | POA: Insufficient documentation

## 2013-01-29 DIAGNOSIS — Z8739 Personal history of other diseases of the musculoskeletal system and connective tissue: Secondary | ICD-10-CM | POA: Insufficient documentation

## 2013-01-29 HISTORY — DX: Anxiety disorder, unspecified: F41.9

## 2013-01-29 LAB — BASIC METABOLIC PANEL
CO2: 23 mEq/L (ref 19–32)
Calcium: 9.6 mg/dL (ref 8.4–10.5)
Chloride: 97 mEq/L (ref 96–112)
Creatinine, Ser: 0.72 mg/dL (ref 0.50–1.10)
Glucose, Bld: 107 mg/dL — ABNORMAL HIGH (ref 70–99)
Sodium: 137 mEq/L (ref 137–147)

## 2013-01-29 LAB — CBC
HCT: 41.8 % (ref 36.0–46.0)
Hemoglobin: 14.6 g/dL (ref 12.0–15.0)
MCH: 28.2 pg (ref 26.0–34.0)
MCHC: 34.9 g/dL (ref 30.0–36.0)
MCV: 80.7 fL (ref 78.0–100.0)
Platelets: 264 10*3/uL (ref 150–400)
RBC: 5.18 MIL/uL — ABNORMAL HIGH (ref 3.87–5.11)
RDW: 14 % (ref 11.5–15.5)
WBC: 12.8 10*3/uL — ABNORMAL HIGH (ref 4.0–10.5)

## 2013-01-29 LAB — BASIC METABOLIC PANEL WITH GFR
BUN: 13 mg/dL (ref 6–23)
GFR calc Af Amer: >90 mL/min (ref >90)
GFR calc non Af Amer: >90 mL/min (ref >90)
Potassium: 3.3 meq/L — ABNORMAL LOW (ref 3.7–5.3)

## 2013-01-29 LAB — PRO B NATRIURETIC PEPTIDE: Pro B Natriuretic peptide (BNP): 31 pg/mL (ref 0–125)

## 2013-01-29 MED ORDER — ALPRAZOLAM 0.25 MG PO TABS: 0.2500 mg | ORAL_TABLET | Freq: Three times a day (TID) | ORAL | Status: DC | PRN

## 2013-01-29 NOTE — ED Notes (Signed)
Pt in xray

## 2013-01-29 NOTE — ED Notes (Signed)
Pt c/o shortness of breath onset 1400 today. EMS responded and accessed pt at that time pt did not want to be transported to ED. About 20 mins later pt began to have symptoms again. Family reports pt has history of anxiety attacks.

## 2013-01-29 NOTE — ED Provider Notes (Signed)
CSN: [id number]     Arrival date & time 01/29/13  1615 History   First MD Initiated Contact with Patient 01/29/13 1742     Chief Complaint  Patient presents with  . Shortness of Breath   (Consider location/radiation/quality/duration/timing/severity/associated sxs/prior Treatment) Patient is a 48 y.o. female presenting with shortness of breath. The history is provided by the patient and the spouse.  Shortness of Breath Severity:  Moderate Onset quality:  Sudden Duration:  20 minutes Timing:  Constant Progression:  Resolved Chronicity:  Recurrent Context: emotional upset   Context: not URI   Relieved by:  Nothing Worsened by:  Nothing tried Ineffective treatments:  Inhaler Associated symptoms: chest pain   Associated symptoms: no abdominal pain, no cough, no diaphoresis, no fever, no syncope and no vomiting   Chest pain:    Quality:  Sharp   Severity:  Moderate   Onset quality:  Sudden   Progression:  Resolved   Chronicity:  Recurrent Risk factors: tobacco use   Risk factors: no family hx of DVT, no hx of PE/DVT and no obesity     Past Medical History  Diagnosis Date  . Obesity   . Chronic pain   . Depression   . Arthritis   . Asthma   . Hypertension   . Anxiety    Past Surgical History  Procedure Laterality Date  . Appendectomy    . Tubal ligation  1995   Family History  Problem Relation Age of Onset  . Heart disease Mother   . Early death Mother   . Asthma Mother   . Diabetes Mother   . Hypertension Mother   . Cancer Father   . Alcohol abuse Father   . Early death Father   . Hypertension Father   . Asthma Sister   . Cancer Sister   . Hypertension Sister   . Depression Brother   . Drug abuse Brother   . Hypertension Brother    History  Substance Use Topics  . Smoking status: Current Every Day Smoker -- 0.50 packs/day    Types: Cigarettes  . Smokeless tobacco: Not on file  . Alcohol Use: No   OB History   Grav Para Term Preterm Abortions TAB SAB  Ect Mult Living                 Review of Systems  Constitutional: Negative for fever and diaphoresis.  Respiratory: Positive for shortness of breath. Negative for cough.   Cardiovascular: Positive for chest pain. Negative for syncope.  Gastrointestinal: Negative for nausea, vomiting and abdominal pain.  Psychiatric/Behavioral: The patient is nervous/anxious.   All other systems reviewed and are negative.    Allergies  Review of patient's allergies indicates no known allergies.  Home Medications   Current Outpatient Rx  Name  Route  Sig  Dispense  Refill  . albuterol (PROVENTIL HFA;VENTOLIN HFA) 108 (90 BASE) MCG/ACT inhaler   Inhalation   Inhale 2 puffs into the lungs every 6 (six) hours as needed. For shortness of breath   1 Inhaler   1 year   . hydrochlorothiazide (HYDRODIURIL) 25 MG tablet   Oral   Take 1 tablet (25 mg total) by mouth daily.   90 tablet   3   . traMADol (ULTRAM) 50 MG tablet   Oral   Take 50 mg by mouth every 6 (six) hours as needed for moderate pain.         Marland Kitchen ALPRAZolam (XANAX) 0.25 MG tablet  Oral   Take 1 tablet (0.25 mg total) by mouth 3 (three) times daily as needed for anxiety.   20 tablet   0    BP 129/64  Pulse 76  Temp(Src) 98.3 F (36.8 C) (Oral)  Resp 18  Ht 5\' 3"  (1.6 m)  Wt 165 lb (74.844 kg)  BMI 29.24 kg/m2  SpO2 100%  LMP 01/29/2013 Physical Exam  Nursing note and vitals reviewed. Constitutional: She is oriented to person, place, and time. She appears well-developed and well-nourished. No distress.  HENT:  Head: Normocephalic and atraumatic.  Eyes: Conjunctivae and EOM are normal. No scleral icterus.  Neck: Normal range of motion. Neck supple.  Cardiovascular: Normal rate, regular rhythm and intact distal pulses.   No murmur heard. Pulmonary/Chest: Effort normal. No respiratory distress. She has no wheezes. She has no rales.  Abdominal: Soft. She exhibits no distension. There is no tenderness.  Neurological: She  is alert and oriented to person, place, and time.  Skin: Skin is warm and dry. She is not diaphoretic.  Psychiatric: She has a normal mood and affect.    ED Course  Procedures (including critical care time) Labs Review Labs Reviewed  CBC - Abnormal; Notable for the following:    WBC 12.8 (*)    RBC 5.18 (*)    All other components within normal limits  BASIC METABOLIC PANEL  PRO B NATRIURETIC PEPTIDE   Imaging Review Dg Chest 2 View (if Patient Has Fever And/or Copd)  01/29/2013   CLINICAL DATA:  Anxiety induced shortness of breath. Substernal chest pain.  EXAM: CHEST  2 VIEW  COMPARISON:  CTA chest 05/22/2011.  FINDINGS: The heart size and mediastinal contours are within normal limits. Both lungs are clear. The visualized skeletal structures are unremarkable.  IMPRESSION: Negative two view chest x-ray.   Electronically Signed   By: Lawrence Santiago M.D.   On: 01/29/2013 17:47    EKG Interpretation    Date/Time:  Saturday January 29 2013 53:66:44 EST Ventricular Rate:  81 PR Interval:  158 QRS Duration: 78 QT Interval:  374 QTC Calculation: 434 R Axis:   19 Text Interpretation:  Normal sinus rhythm Normal ECG No significant change since last tracing Confirmed by [facility]  MD, MICHEAL ([id number]) on 01/29/2013 6:20:52 PM           RA sat is 100% and I interpret to be normal MDM   1. Anxiety    Pt with normal ECG, symptoms resolved makes CAD less likely, h/o anxiety attacks in the past, admits to feeling anxious with sense of impending doom, sharp CP's, not pressure or tightness, no diaphoresis, no nausea.  Pt reassured, will give short course of Xanax and pt can otherwise follow up with PCP next week.      Saddie Benders. Dorna Mai, MD 01/29/13 0347

## 2013-01-29 NOTE — Discharge Instructions (Signed)
Panic Attacks Panic attacks are sudden, short-livedsurges of severe anxiety, fear, or discomfort. They [place] occur for no reason when you are relaxed, when you are anxious, or when you are sleeping. Panic attacks [place] occur for a number of reasons:   Healthy people occasionally have panic attacks in extreme, life-threatening situations, such as war or natural disasters. Normal anxiety is a protective mechanism of the body that helps [place] react to danger (fight or flight response).  Panic attacks are often seen with anxiety disorders, such as panic disorder, social anxiety disorder, generalized anxiety disorder, and phobias. Anxiety disorders cause excessive or uncontrollable anxiety. They [place] interfere with your relationships or other life activities.  Panic attacks are sometimes seen with other mental illnesses such as depression and posttraumatic stress disorder.  Certain medical conditions, prescription medicines, and drugs of abuse can cause panic attacks. SYMPTOMS  Panic attacks start suddenly, peak within 20 minutes, and are accompanied by four or more of the following symptoms:  Pounding heart or fast heart rate (palpitations).  Sweating.  Trembling or shaking.  Shortness of breath or feeling smothered.  Feeling choked.  Chest pain or discomfort.  Nausea or strange feeling in your stomach.  Dizziness, lightheadedness, or feeling like you will faint.  Chills or hot flushes.  Numbness or tingling in your lips or hands and feet.  Feeling that things are not real or feeling that you are not yourself.  Fear of losing control or going crazy.  Fear of dying. Some of these symptoms can mimic serious medical conditions. For example, you [place] think you are having a heart attack. Although panic attacks can be very scary, they are not life threatening. DIAGNOSIS  Panic attacks are diagnosed through an assessment by your health care provider. Your health care provider will ask questions  about your symptoms, such as where and when they occurred. Your health care provider will also ask about your medical history and use of alcohol and drugs, including prescription medicines. Your health care provider [place] order blood tests or other studies to rule out a serious medical condition. Your health care provider [place] refer you to a mental health professional for further evaluation. TREATMENT   Most healthy people who have one or two panic attacks in an extreme, life-threatening situation will not require treatment.  The treatment for panic attacks associated with anxiety disorders or other mental illness typically involves counseling with a mental health professional, medicine, or a combination of both. Your health care provider will help determine what treatment is best for you.  Panic attacks due to physical illness usually goes away with treatment of the illness. If prescription medicine is causing panic attacks, talk with your health care provider about stopping the medicine, decreasing the dose, or substituting another medicine.  Panic attacks due to alcohol or drug abuse goes away with abstinence. Some adults need professional help in order to stop drinking or using drugs. HOME CARE INSTRUCTIONS   Take all your medicines as prescribed.   Check with your health care provider before starting new prescription or over-the-counter medicines.  Keep all follow up appointments with your health care provider. SEEK MEDICAL CARE IF:  You are not able to take your medicines as prescribed.  Your symptoms do not improve or get worse. SEEK IMMEDIATE MEDICAL CARE IF:   You experience panic attack symptoms that are different than your usual symptoms.  You have serious thoughts about hurting yourself or others.  You are taking medicine for panic attacks and  have a serious side effect. MAKE SURE YOU:  Understand these instructions.  Will watch your condition.  Will get help right away  if you are not doing well or get worse. Document Released: 12/30/2004 Document Revised: 10/20/2012 Document Reviewed: 08/13/2012 [facility] Patient Information 2014 [place].

## 2013-02-02 ENCOUNTER — Ambulatory Visit: Payer: Self-pay | Admitting: Internal Medicine

## 2013-02-04 ENCOUNTER — Ambulatory Visit: Payer: Self-pay

## 2013-03-01 ENCOUNTER — Ambulatory Visit: Payer: Self-pay

## 2013-03-10 ENCOUNTER — Ambulatory Visit: Payer: No Typology Code available for payment source | Admitting: Physical Therapy

## 2013-03-15 ENCOUNTER — Encounter: Payer: Self-pay | Admitting: Internal Medicine

## 2013-03-15 ENCOUNTER — Ambulatory Visit: Payer: Self-pay | Attending: Internal Medicine | Admitting: Internal Medicine

## 2013-03-15 VITALS — BP 138/83 | HR 95 | Temp 98.3°F | Resp 16 | Wt 166.4 lb

## 2013-03-15 DIAGNOSIS — N920 Excessive and frequent menstruation with regular cycle: Secondary | ICD-10-CM

## 2013-03-15 DIAGNOSIS — F329 Major depressive disorder, single episode, unspecified: Secondary | ICD-10-CM | POA: Insufficient documentation

## 2013-03-15 DIAGNOSIS — F411 Generalized anxiety disorder: Secondary | ICD-10-CM

## 2013-03-15 DIAGNOSIS — Z09 Encounter for follow-up examination after completed treatment for conditions other than malignant neoplasm: Secondary | ICD-10-CM | POA: Insufficient documentation

## 2013-03-15 DIAGNOSIS — G4700 Insomnia, unspecified: Secondary | ICD-10-CM

## 2013-03-15 DIAGNOSIS — G8929 Other chronic pain: Secondary | ICD-10-CM | POA: Insufficient documentation

## 2013-03-15 DIAGNOSIS — J45909 Unspecified asthma, uncomplicated: Secondary | ICD-10-CM | POA: Insufficient documentation

## 2013-03-15 DIAGNOSIS — I10 Essential (primary) hypertension: Secondary | ICD-10-CM

## 2013-03-15 DIAGNOSIS — F17200 Nicotine dependence, unspecified, uncomplicated: Secondary | ICD-10-CM

## 2013-03-15 DIAGNOSIS — E669 Obesity, unspecified: Secondary | ICD-10-CM | POA: Insufficient documentation

## 2013-03-15 DIAGNOSIS — F3289 Other specified depressive episodes: Secondary | ICD-10-CM | POA: Insufficient documentation

## 2013-03-15 MED ORDER — ALPRAZOLAM 0.25 MG PO TABS: 0.2500 mg | ORAL_TABLET | Freq: Every day | ORAL | Status: DC | PRN

## 2013-03-15 MED ORDER — NICOTINE 21 MG/24HR TD PT24: 21.0000 mg | MEDICATED_PATCH | Freq: Every day | TRANSDERMAL | Status: DC

## 2013-03-15 NOTE — Progress Notes (Signed)
MRN: [id number] Name: Brittany Jimenez  Sex: female Age: 48 y.o. DOB: 06/19/1965  Allergies: Review of patient's allergies indicates no known allergies.  Chief Complaint  Patient presents with  . Follow-up    HPI: Patient is 48 y.o. female who has to of hypertension, anxiety, patient today requesting refill on Xanax which helped her with the symptoms of anxiety, she does not take this medication daily only takes when needed, she also reported to have lot of heavy menstrual bleeding, also patient follows up with orthopedics for ankle pain. Patient has history of asthma and is on albuterol inhaler, patient still smokes cigarettes, advised to quit smoking.  Past Medical History  Diagnosis Date  . Obesity   . Chronic pain   . Depression   . Arthritis   . Asthma   . Hypertension   . Anxiety     Past Surgical History  Procedure Laterality Date  . Appendectomy    . Tubal ligation  1995      Medication List       This list is accurate as of: 03/15/13  3:20 PM.  Always use your most recent med list.               albuterol 108 (90 BASE) MCG/ACT inhaler  Commonly known as:  PROVENTIL HFA;VENTOLIN HFA  Inhale 2 puffs into the lungs every 6 (six) hours as needed. For shortness of breath     ALPRAZolam 0.25 MG tablet  Commonly known as:  XANAX  Take 1 tablet (0.25 mg total) by mouth daily as needed for anxiety.     hydrochlorothiazide 25 MG tablet  Commonly known as:  HYDRODIURIL  Take 1 tablet (25 mg total) by mouth daily.     nicotine 21 mg/24hr patch  Commonly known as:  NICODERM CQ  Place 1 patch (21 mg total) onto the skin daily.     traMADol 50 MG tablet  Commonly known as:  ULTRAM  Take 50 mg by mouth every 6 (six) hours as needed for moderate pain.        Meds ordered this encounter  Medications  . ALPRAZolam (XANAX) 0.25 MG tablet    Sig: Take 1 tablet (0.25 mg total) by mouth daily as needed for anxiety.    Dispense:  20 tablet    Refill:  0    . nicotine (NICODERM CQ) 21 mg/24hr patch    Sig: Place 1 patch (21 mg total) onto the skin daily.    Dispense:  28 patch    Refill:  0     There is no immunization history on file for this patient.  Family History  Problem Relation Age of Onset  . Heart disease Mother   . Early death Mother   . Asthma Mother   . Diabetes Mother   . Hypertension Mother   . Cancer Father   . Alcohol abuse Father   . Early death Father   . Hypertension Father   . Asthma Sister   . Cancer Sister   . Hypertension Sister   . Depression Brother   . Drug abuse Brother   . Hypertension Brother     History  Substance Use Topics  . Smoking status: Current Every Day Smoker -- 0.50 packs/day    Types: Cigarettes  . Smokeless tobacco: Not on file  . Alcohol Use: No    Review of Systems   As noted in HPI  Filed Vitals:   03/15/13 1411  BP: 138/83  Pulse: 95  Temp: 98.3 F (36.8 C)  Resp: 16    Physical Exam  Physical Exam  Constitutional: No distress.  Eyes: EOM are normal. Pupils are equal, round, and reactive to light.  Cardiovascular: Normal rate and regular rhythm.   Pulmonary/Chest: Breath sounds normal. No respiratory distress. She has no wheezes. She has no rales.    CBC    Component Value Date/Time   WBC 12.8* 01/29/2013 1754   RBC 5.18* 01/29/2013 1754   HGB 14.6 01/29/2013 1754   HGB 14.4 06/29/2012 1355   HCT 41.8 01/29/2013 1754   PLT 264 01/29/2013 1754   MCV 80.7 01/29/2013 1754   LYMPHSABS 2.4 05/22/2011 1730   MONOABS 0.6 05/22/2011 1730   EOSABS 0.5 05/22/2011 1730   BASOSABS 0.0 05/22/2011 1730    CMP     Component Value Date/Time   NA 137 01/29/2013 1754   K 3.3* 01/29/2013 1754   CL 97 01/29/2013 1754   CO2 23 01/29/2013 1754   GLUCOSE 107* 01/29/2013 1754   BUN 13 01/29/2013 1754   CREATININE 0.72 01/29/2013 1754   CREATININE 0.83 12/03/2012 1459   CALCIUM 9.6 01/29/2013 1754   PROT 7.3 12/03/2012 1459   ALBUMIN 4.2 12/03/2012 1459   AST 16 12/03/2012 1459    ALT 14 12/03/2012 1459   ALKPHOS 75 12/03/2012 1459   BILITOT 0.3 12/03/2012 1459   GFRNONAA >90 01/29/2013 1754   GFRAA >90 01/29/2013 1754    No results found for this basename: chol,  tri,  ldl    No components found with this basename: hga1c    Lab Results  Component Value Date/Time   AST 16 12/03/2012  2:59 PM    Assessment and Plan  Hypertension, essential, benign Controlled her continue with her hydrochlorothiazide, will repeat blood chemistry on the next visit  Anxiety state, unspecified - Plan: ALPRAZolam (XANAX) 0.25 MG tablet when necessary  Smoking - Plan: nicotine (NICODERM CQ) 21 mg/24hr patch  Menorrhagia - Plan: Ambulatory referral to Gynecology  Insomnia Patient is going to try over-the-counter melatonin.  Return in about 3 months (around 06/15/2013) for hypertension.  Lorayne Marek, MD

## 2013-03-15 NOTE — Patient Instructions (Signed)
DASH Diet  The DASH diet stands for "Dietary Approaches to Stop Hypertension." It is a healthy eating plan that has been shown to reduce high blood pressure (hypertension) in as little as 14 days, while also possibly providing other significant health benefits. These other health benefits include reducing the risk of breast cancer after menopause and reducing the risk of type 2 diabetes, heart disease, colon cancer, and stroke. Health benefits also include weight loss and slowing kidney failure in patients with chronic kidney disease.   DIET GUIDELINES  · Limit salt (sodium). Your diet should contain less than 1500 mg of sodium daily.  · Limit refined or processed carbohydrates. Your diet should include mostly whole grains. Desserts and added sugars should be used sparingly.  · Include small amounts of heart-healthy fats. These types of fats include nuts, oils, and tub margarine. Limit saturated and trans fats. These fats have been shown to be harmful in the body.  CHOOSING FOODS   The following food groups are based on a 2000 calorie diet. See your Registered Dietitian for individual calorie needs.  Grains and Grain Products (6 to 8 servings daily)  · Eat More Often: Whole-wheat bread, brown rice, whole-grain or wheat pasta, quinoa, popcorn without added fat or salt (air popped).  · Eat Less Often: White bread, white pasta, white rice, cornbread.  Vegetables (4 to 5 servings daily)  · Eat More Often: Fresh, frozen, and canned vegetables. Vegetables may be raw, steamed, roasted, or grilled with a minimal amount of fat.  · Eat Less Often/Avoid: Creamed or fried vegetables. Vegetables in a cheese sauce.  Fruit (4 to 5 servings daily)  · Eat More Often: All fresh, canned (in natural juice), or frozen fruits. Dried fruits without added sugar. One hundred percent fruit juice (½ cup [237 mL] daily).  · Eat Less Often: Dried fruits with added sugar. Canned fruit in light or heavy syrup.  Lean Meats, Fish, and Poultry (2  servings or less daily. One serving is 3 to 4 oz [85-114 g]).  · Eat More Often: Ninety percent or leaner ground beef, tenderloin, sirloin. Round cuts of beef, chicken breast, turkey breast. All fish. Grill, bake, or broil your meat. Nothing should be fried.  · Eat Less Often/Avoid: Fatty cuts of meat, turkey, or chicken leg, thigh, or wing. Fried cuts of meat or fish.  Dairy (2 to 3 servings)  · Eat More Often: Low-fat or fat-free milk, low-fat plain or light yogurt, reduced-fat or part-skim cheese.  · Eat Less Often/Avoid: Milk (whole, 2%). Whole milk yogurt. Full-fat cheeses.  Nuts, Seeds, and Legumes (4 to 5 servings per week)  · Eat More Often: All without added salt.  · Eat Less Often/Avoid: Salted nuts and seeds, canned beans with added salt.  Fats and Sweets (limited)  · Eat More Often: Vegetable oils, tub margarines without trans fats, sugar-free gelatin. Mayonnaise and salad dressings.  · Eat Less Often/Avoid: Coconut oils, palm oils, butter, stick margarine, cream, half and half, cookies, candy, pie.  FOR MORE INFORMATION  The Dash Diet Eating Plan: www.dashdiet.org  Document Released: 12/19/2010 Document Revised: 03/24/2011 Document Reviewed: 12/19/2010  [employer]® Patient Information ©2014 [employer].

## 2013-03-15 NOTE — Progress Notes (Signed)
Patient here for follow up Would like referral to OB-GYN Would like a prescription for sleeping pills Needs an Rx for a cane -applying for disability

## 2013-03-21 ENCOUNTER — Ambulatory Visit: Payer: No Typology Code available for payment source | Attending: Sports Medicine | Admitting: Physical Therapy

## 2013-03-21 ENCOUNTER — Ambulatory Visit: Payer: Self-pay

## 2013-03-21 DIAGNOSIS — M25579 Pain in unspecified ankle and joints of unspecified foot: Secondary | ICD-10-CM | POA: Insufficient documentation

## 2013-03-21 DIAGNOSIS — R262 Difficulty in walking, not elsewhere classified: Secondary | ICD-10-CM | POA: Insufficient documentation

## 2013-03-21 DIAGNOSIS — M25676 Stiffness of unspecified foot, not elsewhere classified: Secondary | ICD-10-CM | POA: Insufficient documentation

## 2013-03-21 DIAGNOSIS — IMO0001 Reserved for inherently not codable concepts without codable children: Secondary | ICD-10-CM | POA: Insufficient documentation

## 2013-03-21 DIAGNOSIS — M25673 Stiffness of unspecified ankle, not elsewhere classified: Secondary | ICD-10-CM | POA: Insufficient documentation

## 2013-03-24 ENCOUNTER — Ambulatory Visit: Payer: No Typology Code available for payment source | Admitting: Physical Therapy

## 2013-03-28 ENCOUNTER — Ambulatory Visit: Payer: No Typology Code available for payment source | Admitting: Physical Therapy

## 2013-03-31 ENCOUNTER — Encounter: Payer: Self-pay | Admitting: Rehabilitation

## 2013-04-04 ENCOUNTER — Ambulatory Visit: Payer: No Typology Code available for payment source | Admitting: Rehabilitation

## 2013-04-06 ENCOUNTER — Ambulatory Visit: Payer: No Typology Code available for payment source | Admitting: Rehabilitation

## 2013-04-11 ENCOUNTER — Ambulatory Visit: Payer: No Typology Code available for payment source | Admitting: Rehabilitation

## 2013-04-11 ENCOUNTER — Ambulatory Visit: Payer: Self-pay | Admitting: Sports Medicine

## 2013-04-13 ENCOUNTER — Ambulatory Visit: Payer: No Typology Code available for payment source | Attending: Sports Medicine | Admitting: Rehabilitation

## 2013-04-13 DIAGNOSIS — M25673 Stiffness of unspecified ankle, not elsewhere classified: Secondary | ICD-10-CM | POA: Insufficient documentation

## 2013-04-13 DIAGNOSIS — R262 Difficulty in walking, not elsewhere classified: Secondary | ICD-10-CM | POA: Insufficient documentation

## 2013-04-13 DIAGNOSIS — M25579 Pain in unspecified ankle and joints of unspecified foot: Secondary | ICD-10-CM | POA: Insufficient documentation

## 2013-04-13 DIAGNOSIS — IMO0001 Reserved for inherently not codable concepts without codable children: Secondary | ICD-10-CM | POA: Insufficient documentation

## 2013-04-13 DIAGNOSIS — M25676 Stiffness of unspecified foot, not elsewhere classified: Secondary | ICD-10-CM | POA: Insufficient documentation

## 2013-05-11 ENCOUNTER — Encounter: Payer: Self-pay | Admitting: Obstetrics & Gynecology

## 2013-06-15 ENCOUNTER — Ambulatory Visit: Payer: Self-pay | Admitting: Internal Medicine

## 2013-06-22 ENCOUNTER — Encounter: Payer: Self-pay | Admitting: Obstetrics & Gynecology

## 2013-06-24 ENCOUNTER — Other Ambulatory Visit: Payer: Self-pay | Admitting: Internal Medicine

## 2013-07-05 ENCOUNTER — Other Ambulatory Visit: Payer: Self-pay | Admitting: *Deleted

## 2013-07-05 DIAGNOSIS — F411 Generalized anxiety disorder: Secondary | ICD-10-CM

## 2013-07-07 MED ORDER — ALPRAZOLAM 0.25 MG PO TABS: 0.2500 mg | ORAL_TABLET | Freq: Every day | ORAL | Status: DC | PRN

## 2013-07-20 ENCOUNTER — Encounter: Payer: Self-pay | Admitting: Obstetrics & Gynecology

## 2013-07-20 ENCOUNTER — Ambulatory Visit: Payer: Self-pay | Admitting: Internal Medicine

## 2013-07-22 ENCOUNTER — Encounter (HOSPITAL_COMMUNITY): Payer: Self-pay | Admitting: *Deleted

## 2013-07-22 ENCOUNTER — Inpatient Hospital Stay (HOSPITAL_COMMUNITY)
Admission: AD | Admit: 2013-07-22 | Discharge: 2013-07-25 | DRG: 885 | Disposition: A | Discharge status: Home / Self Care | Payer: No Typology Code available for payment source | Attending: Psychiatry | Admitting: Psychiatry

## 2013-07-22 DIAGNOSIS — G4700 Insomnia, unspecified: Secondary | ICD-10-CM | POA: Diagnosis present

## 2013-07-22 DIAGNOSIS — Z833 Family history of diabetes mellitus: Secondary | ICD-10-CM

## 2013-07-22 DIAGNOSIS — I10 Essential (primary) hypertension: Secondary | ICD-10-CM | POA: Diagnosis present

## 2013-07-22 DIAGNOSIS — M129 Arthropathy, unspecified: Secondary | ICD-10-CM | POA: Diagnosis present

## 2013-07-22 DIAGNOSIS — F332 Major depressive disorder, recurrent severe without psychotic features: Secondary | ICD-10-CM | POA: Diagnosis present

## 2013-07-22 DIAGNOSIS — G8929 Other chronic pain: Secondary | ICD-10-CM | POA: Diagnosis present

## 2013-07-22 DIAGNOSIS — J45909 Unspecified asthma, uncomplicated: Secondary | ICD-10-CM | POA: Diagnosis present

## 2013-07-22 DIAGNOSIS — Z825 Family history of asthma and other chronic lower respiratory diseases: Secondary | ICD-10-CM | POA: Diagnosis not present

## 2013-07-22 DIAGNOSIS — F411 Generalized anxiety disorder: Secondary | ICD-10-CM | POA: Diagnosis present

## 2013-07-22 DIAGNOSIS — Z8249 Family history of ischemic heart disease and other diseases of the circulatory system: Secondary | ICD-10-CM

## 2013-07-22 DIAGNOSIS — R45851 Suicidal ideations: Secondary | ICD-10-CM

## 2013-07-22 DIAGNOSIS — Z609 Problem related to social environment, unspecified: Secondary | ICD-10-CM | POA: Diagnosis not present

## 2013-07-22 DIAGNOSIS — F17200 Nicotine dependence, unspecified, uncomplicated: Secondary | ICD-10-CM | POA: Diagnosis present

## 2013-07-22 DIAGNOSIS — F329 Major depressive disorder, single episode, unspecified: Secondary | ICD-10-CM | POA: Diagnosis present

## 2013-07-22 DIAGNOSIS — F1210 Cannabis abuse, uncomplicated: Secondary | ICD-10-CM | POA: Diagnosis present

## 2013-07-22 LAB — COMPREHENSIVE METABOLIC PANEL
ALT: 12 U/L (ref 0–35)
AST: 17 U/L (ref 0–37)
Albumin: 3.8 g/dL (ref 3.5–5.2)
Alkaline Phosphatase: 74 U/L (ref 39–117)
Anion gap: 11 (ref 5–15)
BUN: 16 mg/dL (ref 6–23)
CALCIUM: 10 mg/dL (ref 8.4–10.5)
CO2: 28 mEq/L (ref 19–32)
Chloride: 101 mEq/L (ref 96–112)
Creatinine, Ser: 0.94 mg/dL (ref 0.50–1.10)
GFR calc Af Amer: 81 mL/min — ABNORMAL LOW (ref >90)
GFR calc non Af Amer: 70 mL/min — ABNORMAL LOW (ref >90)
Glucose, Bld: 96 mg/dL (ref 70–99)
Potassium: 3.8 mEq/L (ref 3.7–5.3)
Sodium: 140 mEq/L (ref 137–147)
TOTAL PROTEIN: 8 g/dL (ref 6.0–8.3)
Total Bilirubin: <0.2 mg/dL — ABNORMAL LOW (ref 0.3–1.2)

## 2013-07-22 LAB — URINALYSIS, ROUTINE W REFLEX MICROSCOPIC
Bilirubin Urine: NEGATIVE
GLUCOSE, UA: NEGATIVE mg/dL
Ketones, ur: NEGATIVE mg/dL
NITRITE: NEGATIVE
PH: 6 (ref 5.0–8.0)
Protein, ur: NEGATIVE mg/dL
Specific Gravity, Urine: 1.025 (ref 1.005–1.030)
Urobilinogen, UA: 1 mg/dL (ref 0.0–1.0)

## 2013-07-22 LAB — CBC
HCT: 40.4 % (ref 36.0–46.0)
Hemoglobin: 13.5 g/dL (ref 12.0–15.0)
MCH: 27.3 pg (ref 26.0–34.0)
MCHC: 33.4 g/dL (ref 30.0–36.0)
MCV: 81.8 fL (ref 78.0–100.0)
PLATELETS: 224 10*3/uL (ref 150–400)
RBC: 4.94 MIL/uL (ref 3.87–5.11)
RDW: 14 % (ref 11.5–15.5)
WBC: 11.9 10*3/uL — ABNORMAL HIGH (ref 4.0–10.5)

## 2013-07-22 LAB — TSH: TSH: 0.435 u[IU]/mL (ref 0.350–4.500)

## 2013-07-22 LAB — URINE MICROSCOPIC-ADD ON

## 2013-07-22 LAB — RAPID URINE DRUG SCREEN, HOSP PERFORMED
Amphetamines: NOT DETECTED
BENZODIAZEPINES: NOT DETECTED
Barbiturates: NOT DETECTED
COCAINE: NOT DETECTED
Opiates: NOT DETECTED
Tetrahydrocannabinol: POSITIVE — AB

## 2013-07-22 LAB — PREGNANCY, URINE: PREG TEST UR: NEGATIVE

## 2013-07-22 MED ORDER — ACETAMINOPHEN 325 MG PO TABS
650.0000 mg | ORAL_TABLET | Freq: Four times a day (QID) | ORAL | Status: DC | PRN
  Administered 2013-07-23 – 2013-07-24 (×2): 650 mg via ORAL
  Filled 2013-07-22 (×2): qty 2

## 2013-07-22 MED ORDER — BUPROPION HCL ER (XL) 300 MG PO TB24
300.0000 mg | ORAL_TABLET | Freq: Every day | ORAL | Status: DC
  Administered 2013-07-22 – 2013-07-25 (×4): 300 mg via ORAL
  Filled 2013-07-22 (×6): qty 1

## 2013-07-22 MED ORDER — HYDROCHLOROTHIAZIDE 25 MG PO TABS
25.0000 mg | ORAL_TABLET | Freq: Every day | ORAL | Status: DC
  Administered 2013-07-23 – 2013-07-25 (×3): 25 mg via ORAL
  Filled 2013-07-22 (×7): qty 1

## 2013-07-22 MED ORDER — BUSPIRONE HCL 5 MG PO TABS
5.0000 mg | ORAL_TABLET | Freq: Two times a day (BID) | ORAL | Status: DC
  Administered 2013-07-22 – 2013-07-25 (×6): 5 mg via ORAL
  Filled 2013-07-22 (×9): qty 1

## 2013-07-22 MED ORDER — LORAZEPAM 0.5 MG PO TABS: 0.2500 mg | ORAL_TABLET | Freq: Three times a day (TID) | ORAL | Status: DC | PRN

## 2013-07-22 MED ORDER — ALUM & MAG HYDROXIDE-SIMETH 200-200-20 MG/5ML PO SUSP: 30.0000 mL | ORAL | Status: DC | PRN

## 2013-07-22 MED ORDER — MAGNESIUM HYDROXIDE 400 MG/5ML PO SUSP: 30.0000 mL | Freq: Every day | ORAL | Status: DC | PRN

## 2013-07-22 MED ORDER — NICOTINE 21 MG/24HR TD PT24
21.0000 mg | MEDICATED_PATCH | Freq: Every day | TRANSDERMAL | Status: DC
  Administered 2013-07-24: 21 mg via TRANSDERMAL
  Filled 2013-07-22 (×4): qty 1

## 2013-07-22 MED ORDER — TRAZODONE 25 MG HALF TABLET
25.0000 mg | ORAL_TABLET | Freq: Every day | ORAL | Status: DC
  Administered 2013-07-22 – 2013-07-23 (×2): 25 mg via ORAL
  Filled 2013-07-22 (×4): qty 1

## 2013-07-22 NOTE — Progress Notes (Signed)
[facility] Notes:  (Nursing/MHT/Case Management/Adjunct)  Date:  07/22/2013  Time:  9:29 PM  Type of Therapy:  Psychoeducational Skills  Participation Level:  Active  Participation Quality:  Attentive  Affect:  Depressed  Cognitive:  Appropriate  Insight:  Improving  Engagement in Group:  Engaged  Modes of Intervention:  Education  Summary of Progress/Problems: The patient described her day as having been "okay" overall. She states that today was the first day in which she has been able to work on organizing herself and trying to take care of herself. In terms of the theme for the day, her support system will consist of her husband.    Archie Balboa S 07/22/2013, 9:29 PM

## 2013-07-22 NOTE — Progress Notes (Signed)
Patient ID: Brittany Jimenez, female   DOB: Jan 13, 1966, 48 y.o.   MRN: [id number] Admit Note. Patient presents for voluntary admission to [facility] Adult unit. Patient reports she has been increasingly depressed with suicidal ideation to overdose.  Pt reports recent stressors are job loss, and financial stressors. 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 Pt reports discord with her husband as well. Pt upon admission is able to contract for safety. Pt cooperative with admission process. She denies any AH/VH upon admission. Search completed upon admission, no skin issues or contraband noted. Pt oriented to the unit and ward rules. No further voiced concerns at this time. Will continue to monitor q 15 minutes for safety.

## 2013-07-22 NOTE — Progress Notes (Signed)
D:  Pt passive SI-contracts for safety. Pt denies HI/AVH. Pt is pleasant and cooperative. Pt very depressed "i feel like I'm alone". Pt feels as if husband and daughter don't a[ppreciate her. Pt feels all the people in her life Mother, father, sister, and aunt that died left her alone.   A: Pt was offered support and encouragement. Pt was given scheduled medications. Pt was encourage to attend groups. Q 15 minute checks were done for safety.   R:Pt attends groups and interacts well with peers and staff. Pt is taking medication.Pt receptive to treatment and safety maintained on unit.

## 2013-07-22 NOTE — BH Assessment (Signed)
Assessment Note  Brittany Jimenez is an 48 y.o. African American female that reports suicidal ideation with a plan to take pills.  Patient reports that she has been feeling depressed and anxious for the past month.  Patient reports that she does not want to live like this anymore.  Patient reports that he husband has been ignoring her.  Patient reports that he would rather spending time with her friends than with him.  Patient reports that she is not able to contract for safety.    Patient reports depression associated with the death of heir sister in 2011-05-12.  Patient reports that the when she left her sister alone in [place] she died four days later.  Patient was crying throughout the session. Patient reports compliance with psychiatric medication.  Patient reports that she receives medication management and outpatient therapy from [facility].  Patient denies substance abuse.  Patient denies psychosis.    Patient reports that she feels as if she wants to hurt the man that lives across the street because he spends a lot of time with her husband.  Patient denies having a plan.  Patient denies ever acting on any of those feelings.       Axis I: Anxiety Disorder NOS and Major Depression, single episode Axis II: Deferred Axis III:  Past Medical History  Diagnosis Date  . Obesity   . Chronic pain   . Depression   . Arthritis   . Asthma   . Hypertension   . Anxiety    Axis IV: economic problems, occupational problems, other psychosocial or environmental problems, problems related to social environment, problems with access to health care services and problems with primary support group Axis V: 31-40 impairment in reality testing  Past Medical History:  Past Medical History  Diagnosis Date  . Obesity   . Chronic pain   . Depression   . Arthritis   . Asthma   . Hypertension   . Anxiety     Past Surgical History  Procedure Laterality Date  . Appendectomy    . Tubal ligation  1995     Family History:  Family History  Problem Relation Age of Onset  . Heart disease Mother   . Early death Mother   . Asthma Mother   . Diabetes Mother   . Hypertension Mother   . Cancer Father   . Alcohol abuse Father   . Early death Father   . Hypertension Father   . Asthma Sister   . Cancer Sister   . Hypertension Sister   . Depression Brother   . Drug abuse Brother   . Hypertension Brother     Social History:  reports that she has been smoking Cigarettes.  She has been smoking about 0.50 packs per day. She does not have any smokeless tobacco history on file. She reports that she does not drink alcohol or use illicit drugs.  Additional Social History:     CIWA:   COWS:    Allergies: No Known Allergies  Home Medications:  (Not in a hospital admission)  OB/GYN Status:  No LMP recorded.  General Assessment Data Location of Assessment: [facility] Is this a Tele or Face-to-Face Assessment?: Face-to-Face Is this an Initial Assessment or a Re-assessment for this encounter?: Initial Assessment Living Arrangements: Spouse/significant other (Husband) Can pt return to current living arrangement?: Yes Admission Status: Voluntary Is patient capable of signing voluntary admission?: Yes Transfer from: Home Referral Source: Self/Family/Friend  Medical Screening Exam [facility]  Walk-in ONLY) Medical Exam completed: Yes  [facility] Crisis Care Plan Living Arrangements: Spouse/significant other (Husband) Name of Psychiatrist: Beverly Sessions  Name of Therapist: [facility]   Education Status Is patient currently in school?: No Current Grade: NA Highest grade of school patient has completed: NA Name of school: NA Contact person: NA  Risk to self Suicidal Ideation: Yes-Currently Present Suicidal Intent: Yes-Currently Present Is patient at risk for suicide?: Yes Suicidal Plan?: Yes-Currently Present Specify Current Suicidal Plan: Taking an overdose of medication. Access to Means:  Yes Specify Access to Suicidal Means: Pills What has been your use of drugs/alcohol within the last 12 months?: None Reported Previous Attempts/Gestures: No How many times?: 0 Other Self Harm Risks: None Reported Triggers for Past Attempts:  (None Reported) Intentional Self Injurious Behavior: None Family Suicide History: No Recent stressful life event(s): Job Loss;Financial Problems;Loss (Comment) Persecutory voices/beliefs?: Yes Depression: Yes Depression Symptoms: Despondent;Insomnia;Tearfulness;Guilt;Fatigue;Loss of interest in usual pleasures;Feeling worthless/self pity;Feeling angry/irritable Substance abuse history and/or treatment for substance abuse?: No Suicide prevention information given to non-admitted patients: Yes  Risk to Others Homicidal Ideation: No Thoughts of Harm to Others: Yes-Currently Present Comment - Thoughts of Harm to Others: The man that lives accross the street Current Homicidal Intent: No Current Homicidal Plan: No Access to Homicidal Means: No Identified Victim: None Reported History of harm to others?: No Assessment of Violence: None Noted Violent Behavior Description: None Reported Does patient have access to weapons?: No Criminal Charges Pending?: No Does patient have a court date: No  Psychosis Hallucinations: None noted Delusions: None noted  Mental Status Report Appear/Hygiene: Disheveled;Body odor Eye Contact: Poor Motor Activity: Freedom of movement;Restlessness Speech: Logical/coherent;Pressured Level of Consciousness: Alert;Quiet/awake;Restless Mood: Depressed;Anxious;Despair;Helpless;Worthless, low self-esteem Affect: Anxious;Depressed;Sad;Sullen Anxiety Level: Moderate Thought Processes: Coherent;Relevant Judgement: Unimpaired Orientation: Person;Place;Time;Situation Obsessive Compulsive Thoughts/Behaviors: None  Cognitive Functioning Concentration: Decreased Memory: Remote Intact;Recent Intact IQ: Average Insight:  Fair Impulse Control: Fair Appetite: Poor Weight Loss: 0 Weight Gain: 0 Sleep: Decreased Total Hours of Sleep: 4 Vegetative Symptoms: Decreased grooming;Staying in bed  ADLScreening [facility] Assessment Services) Patient's cognitive ability adequate to safely complete daily activities?: Yes Patient able to express need for assistance with ADLs?: Yes Independently performs ADLs?: Yes (appropriate for developmental age)  Prior Inpatient Therapy Prior Inpatient Therapy: No Prior Therapy Dates: NA Prior Therapy Facilty/Provider(s): NA Reason for Treatment: NA  Prior Outpatient Therapy Prior Outpatient Therapy: No Prior Therapy Dates: NA Prior Therapy Facilty/Provider(s): NA Reason for Treatment: NA  ADL Screening (condition at time of admission) Patient's cognitive ability adequate to safely complete daily activities?: Yes Patient able to express need for assistance with ADLs?: Yes Independently performs ADLs?: Yes (appropriate for developmental age)                  Additional Information 1:1 In Past 12 Months?: No CIRT Risk: No Elopement Risk: No Does patient have medical clearance?: Yes     Disposition:  Disposition Initial Assessment Completed for this Encounter: Yes Disposition of Patient: Inpatient treatment program Type of inpatient treatment program: Adult (PER kUMAR, PATIENT MEETS CRITERIAS FOR INPATIENT HOSPITASLIZ)  On Site Evaluation by:   Reviewed with Physician:    Graciella Freer [place] 07/22/2013 12:25 PM

## 2013-07-23 MED ORDER — CIPROFLOXACIN HCL 250 MG PO TABS
250.0000 mg | ORAL_TABLET | Freq: Two times a day (BID) | ORAL | Status: DC
  Administered 2013-07-23 – 2013-07-25 (×4): 250 mg via ORAL
  Filled 2013-07-23 (×8): qty 1

## 2013-07-23 NOTE — Progress Notes (Signed)
Psychoeducational Group Note  Date:  08/23/2011 Time: 1015  Group Topic/Focus:  Identifying Needs:   The focus of this group is to help patients identify their personal needs that have been historically problematic and identify healthy behaviors to address their needs.  Participation Level:  active Participation Quality: good Affect: flat Cognitive:    Insight:  good  Engagement in Group: engaged  Additional Comments:

## 2013-07-23 NOTE — H&P (Signed)
Psychiatric Admission Assessment Adult  Patient Identification:  Brittany Jimenez Date of Evaluation:  07/23/2013 Chief Complaint:  MDD  History of Present Illness:: Brittany Jimenez is an 48 y.o. African American female that reports suicidal ideation with a plan to take pills. Patient reports that she has been feeling depressed and anxious for the past month. Patient reports that she does not want to live like this anymore. Patient reports that her husband have been ignoring her and her daughter moved out of her house with her 61 year old grand son.  Patient states her daughter have not allowed her speak with her grand son because she told her daughter that she need to pay attention to her 41 year old son.  Patient reports her daughter does not bath or feed her 14 year old son and she, as the grand mother did not accept that.  Patient reports her daughter accused her of trying to be the mother instead of grand mother to the two year old.  Patient was tearful throughout this interview.  Patient stated " I am married but feels lonely"   Patient reports that her husband does not pay any attention to her and she then spends her time with her friends.  Patient states that it hurts her feelings to be neglected by her husband.  She denied previous mental health hospitalization.  She receives outpatient treatment at [facility].  She denies substance abuse use or treatment but her UDS was positive for Marijuana.  Patient today denies SI/HI/AVH.  Patient reports she used to hear voices or her door opened but now with an increase in her Wellbutrin, she does not any more.  We will continue to monitor patient and take care of her needs.  Patient have been encouraged to attend all groups.  Based on her urinalysis result and c/o discomfort when urinating we will start patient on Cipro for 10 days.  Elements:  Location:  Depresssion, anhedonia, insomnia, anger, agiataion, suicidal. Quality:  on going, severe. Severity:   severe, felt suicidal and homicidal. Context:  Increased depression, Family discord, Marital discord. Associated Signs/Synptoms: Depression Symptoms:  depressed mood, anhedonia, insomnia, psychomotor retardation, fatigue, feelings of worthlessness/guilt, hopelessness, suicidal thoughts without plan, anxiety, (Hypo) Manic Symptoms:  NA Anxiety Symptoms:  Excessive Worry, Psychotic Symptoms:  NA PTSD Symptoms: NA  Total Time spent with patient: 1 hour  Psychiatric Specialty Exam: Physical Exam  ROS  Blood pressure 108/71, pulse 62, temperature 98.2 F (36.8 C), temperature source Oral, resp. rate 18, height 5' 3"  (1.6 m), weight 73.483 kg (162 lb), SpO2 100.00%.Body mass index is 28.7 kg/(m^2).  General Appearance: Casual and Fairly Groomed  Eye Contact::  Good  Speech:  Clear and Coherent and Normal Rate  Volume:  Normal  Mood:  Angry, Anxious, Depressed, Hopeless and Worthless  Affect:  Appropriate, Congruent, Depressed, Flat and Tearful  Thought Process:  Coherent and Goal Directed  Orientation:  Full (Time, Place, and Person)  Thought Content:  WDL  Suicidal Thoughts:  No  Homicidal Thoughts:  No  Memory:  Immediate;   Good Recent;   Good Remote;   Good  Judgement:  Good  Insight:  Good  Psychomotor Activity:  Normal  Concentration:  Good  Recall:  NA  Fund of Knowledge:Good  Language: Good  Akathisia:  NA  Handed:  Right  AIMS (if indicated):     Assets:  Desire for Improvement  Sleep:  Number of Hours: 5    Musculoskeletal: Strength & Muscle Tone: within  normal limits Gait & Station: normal Patient leans: N/A  Past Psychiatric History: Diagnosis: Depressive disorder  Hospitalizations: None  Outpatient Care: [facility]  Substance Abuse Care: denied  Self-Mutilation:denied  Suicidal Attempts: denied  Violent Behaviors: denied   Past Medical History:   Past Medical History  Diagnosis Date  . Obesity   . Chronic pain   . Depression   . Arthritis    . Asthma   . Hypertension   . Anxiety    None. Allergies:  No Known Allergies PTA Medications: Prescriptions prior to admission  Medication Sig Dispense Refill  . albuterol (PROVENTIL HFA;VENTOLIN HFA) 108 (90 BASE) MCG/ACT inhaler Inhale 2 puffs into the lungs every 6 (six) hours as needed. For shortness of breath  1 Inhaler  1 year  . ALPRAZolam (XANAX) 0.25 MG tablet Take 0.25 mg by mouth daily as needed for anxiety.      . hydrochlorothiazide (HYDRODIURIL) 25 MG tablet Take 25 mg by mouth daily.        Previous Psychotropic Medications:  Medication/Dose  See Above               Substance Abuse History in the last 12 months:  No.  Consequences of Substance Abuse: NA  Social History:  reports that she has been smoking Cigarettes.  She has been smoking about 0.50 packs per day. She does not have any smokeless tobacco history on file. She reports that she does not drink alcohol or use illicit drugs. Additional Social History:   Current Place of Residence:   Place of Birth:   Family Members: Marital Status:  Married Children:  Sons:  Daughters: Relationships: Education:  Dentist Problems/Performance: Religious Beliefs/Practices: History of Abuse (Emotional/Phsycial/Sexual) Ship broker History:  None. Legal History: Hobbies/Interests:  Family History:   Family History  Problem Relation Age of Onset  . Heart disease Mother   . Early death Mother   . Asthma Mother   . Diabetes Mother   . Hypertension Mother   . Cancer Father   . Alcohol abuse Father   . Early death Father   . Hypertension Father   . Asthma Sister   . Cancer Sister   . Hypertension Sister   . Depression Brother   . Drug abuse Brother   . Hypertension Brother     Results for orders placed during the hospital encounter of 07/22/13 (from the past 72 hour(s))  URINALYSIS, ROUTINE W REFLEX MICROSCOPIC     Status: Abnormal   Collection Time     07/22/13  2:04 PM      Result Value Ref Range   Color, Urine YELLOW  YELLOW   APPearance TURBID (*) CLEAR   Specific Gravity, Urine 1.025  1.005 - 1.030   pH 6.0  5.0 - 8.0   Glucose, UA NEGATIVE  NEGATIVE mg/dL   Hgb urine dipstick LARGE (*) NEGATIVE   Bilirubin Urine NEGATIVE  NEGATIVE   Ketones, ur NEGATIVE  NEGATIVE mg/dL   Protein, ur NEGATIVE  NEGATIVE mg/dL   Urobilinogen, UA 1.0  0.0 - 1.0 mg/dL   Nitrite NEGATIVE  NEGATIVE   Leukocytes, UA TRACE (*) NEGATIVE   Comment: Performed at [place], URINE     Status: None   Collection Time    07/22/13  2:04 PM      Result Value Ref Range   Preg Test, Ur NEGATIVE  NEGATIVE   Comment:  THE SENSITIVITY OF THIS     METHODOLOGY IS >20 mIU/mL.     Performed at [place] (HOSP PERFORMED)     Status: Abnormal   Collection Time    07/22/13  2:04 PM      Result Value Ref Range   Opiates NONE DETECTED  NONE DETECTED   Cocaine NONE DETECTED  NONE DETECTED   Benzodiazepines NONE DETECTED  NONE DETECTED   Amphetamines NONE DETECTED  NONE DETECTED   Tetrahydrocannabinol POSITIVE (*) NONE DETECTED   Barbiturates NONE DETECTED  NONE DETECTED   Comment:            DRUG SCREEN FOR MEDICAL PURPOSES     ONLY.  IF CONFIRMATION IS NEEDED     FOR ANY PURPOSE, NOTIFY LAB     WITHIN 5 DAYS.                LOWEST DETECTABLE LIMITS     FOR URINE DRUG SCREEN     Drug Class       Cutoff (ng/mL)     Amphetamine      1000     Barbiturate      200     Benzodiazepine   833     Tricyclics       825     Opiates          300     Cocaine          300     THC              50     Performed at [place] [facility]     Status: Abnormal   Collection Time    07/22/13  2:04 PM      Result Value Ref Range   Squamous Epithelial / LPF RARE  RARE   WBC, UA 3-6  <3 WBC/hpf   Bacteria, UA MANY (*) RARE   Urine-Other AMORPHOUS  URATES/PHOSPHATES     Comment: Performed at [facility]  CBC     Status: Abnormal   Collection Time    07/22/13  7:54 PM      Result Value Ref Range   WBC 11.9 (*) 4.0 - 10.5 K/uL   RBC 4.94  3.87 - 5.11 MIL/uL   Hemoglobin 13.5  12.0 - 15.0 g/dL   HCT 40.4  36.0 - 46.0 %   MCV 81.8  78.0 - 100.0 fL   MCH 27.3  26.0 - 34.0 pg   MCHC 33.4  30.0 - 36.0 g/dL   RDW 14.0  11.5 - 15.5 %   Platelets 224  150 - 400 K/uL   Comment: Performed at [place]     Status: Abnormal   Collection Time    07/22/13  7:54 PM      Result Value Ref Range   Sodium 140  137 - 147 mEq/L   Potassium 3.8  3.7 - 5.3 mEq/L   Chloride 101  96 - 112 mEq/L   CO2 28  19 - 32 mEq/L   Glucose, Bld 96  70 - 99 mg/dL   BUN 16  6 - 23 mg/dL   Creatinine, Ser 0.94  0.50 - 1.10 mg/dL   Calcium 10.0  8.4 - 10.5 mg/dL   Total Protein 8.0  6.0 - 8.3 g/dL   Albumin 3.8  3.5 - 5.2 g/dL  AST 17  0 - 37 U/L   ALT 12  0 - 35 U/L   Alkaline Phosphatase 74  39 - 117 U/L   Total Bilirubin <0.2 (*) 0.3 - 1.2 mg/dL   GFR calc non Af Amer 70 (*) >90 mL/min   GFR calc Af Amer 81 (*) >90 mL/min   Comment: (NOTE)     The eGFR has been calculated using the CKD EPI equation.     This calculation has not been validated in all clinical situations.     eGFR's persistently <90 mL/min signify possible Chronic Kidney     Disease.   Anion gap 11  5 - 15   Comment: Performed at [facility]  TSH     Status: None   Collection Time    07/22/13  7:54 PM      Result Value Ref Range   TSH 0.435  0.350 - 4.500 uIU/mL   Comment: Performed at [facility]   Psychological Evaluations:  Assessment:   DSM5:  Schizophrenia Disorders:  NA Obsessive-Compulsive Disorders:  NA Trauma-Stressor Disorders:  NA Substance/Addictive Disorders:  NA, Denied, UDS positive for Marijuana Depressive Disorders:  Major Depressive Disorder - Severe  (296.23)  AXIS I:  Major Depression, Recurrent severe and Marijuana abuse AXIS II:  Deferred AXIS III:   Past Medical History  Diagnosis Date  . Obesity   . Chronic pain   . Depression   . Arthritis   . Asthma   . Hypertension   . Anxiety    AXIS IV:  other psychosocial or environmental problems and problems related to social environment AXIS V:  41-50 serious symptoms  Treatment Plan/Recommendations:   Admit for crisis management/stabilization. Review and reinstate any pertinent home medications for other health issues.   Medication management to treat current mood problems. Continue taking Wellbutrin 300 XL, 24 HRS mg po daily for depression Buspar 5 mg po bid for anxiety Trazodone 25 mg po at bed time for sleep Lorazpam 0.25 mg po every 8 hours as needed for anxiety Group counseling sessions and activities. Primary care consults as needed. Continue current treatment plan .  Treatment Plan Summary: Daily contact with patient to assess and evaluate symptoms and progress in treatment Medication management Current Medications:  Current Facility-Administered Medications  Medication Dose Route Frequency Provider Last Rate Last Dose  . acetaminophen (TYLENOL) tablet 650 mg  650 mg Oral Q6H PRN Hampton Abbot, MD      . alum & mag hydroxide-simeth (MAALOX/MYLANTA) 200-200-20 MG/5ML suspension 30 mL  30 mL Oral Q4H PRN Hampton Abbot, MD      . buPROPion (WELLBUTRIN XL) 24 hr tablet 300 mg  300 mg Oral Daily Hampton Abbot, MD   300 mg at 07/23/13 0845  . busPIRone (BUSPAR) tablet 5 mg  5 mg Oral BID Hampton Abbot, MD   5 mg at 07/23/13 0845  . hydrochlorothiazide (HYDRODIURIL) tablet 25 mg  25 mg Oral Daily Hampton Abbot, MD   25 mg at 07/23/13 0845  . LORazepam (ATIVAN) tablet 0.25 mg  0.25 mg Oral Q8H PRN Hampton Abbot, MD      . magnesium hydroxide (MILK OF MAGNESIA) suspension 30 mL  30 mL Oral Daily PRN Hampton Abbot, MD      . nicotine (NICODERM CQ - dosed in mg/24 hours)  patch 21 mg  21 mg Transdermal Q0600 Hampton Abbot, MD      . traZODone (DESYREL) tablet 25 mg  25 mg Oral QHS Manus Gunning  Barrett Henle, NP   25 mg at 07/22/13 2226    Observation Level/Precautions:  15 minute checks  Laboratory:  CBC Chemistry Profile HCG UDS UA Alcohol level  Psychotherapy:  Encouraged to attend group therapy  Medications:   See medication list  Consultations:  As needed  Discharge Concerns:  Non compliance with her medications  Estimated LOS: 3-5 DAYS  Other:     I certify that inpatient services furnished can reasonably be expected to improve the patient's condition.   Charmaine Downs, C   PMHNP-BC 7/11/201511:09 AM

## 2013-07-23 NOTE — Progress Notes (Signed)
Psychoeducational Group Note  Date:  07/23/2013 Time: 1015  Group Topic/Focus:  Identifying Needs:   The focus of this group is to help patients identify their persona goals they have and to identify healthy steps used to accomplish these goals.  Participation Level:  Minimal  Participation Quality:  Appropriate  Affect:  Appropriate  Cognitive:  Appropriate  Insight:  Limited  Engagement in Group:  Limited  Additional Comments:    07/23/2013,10:40 AM Judy Goodenow, Trixie Rude

## 2013-07-23 NOTE — [facility] Suicide Risk Assessment (Signed)
Patient ID: Brittany Jimenez, female DOB: [birth year], 48 y.o. MRN: [id number]   Nursing information obtained from:  Patient Demographic factors:  Low socioeconomic status;Unemployed Current Mental Status:  Suicidal ideation indicated by patient Loss Factors:  Financial problems / change in socioeconomic status Historical Factors:  Family history of mental illness or substance abuse,denies hx of SA Risk Reduction Factors:  Sense of responsibility to family;Religious beliefs about death;Positive social support Total Time spent with patient: 20 minutes  CLINICAL FACTORS:   Depression:   Anhedonia Hopelessness Insomnia  Psychiatric Specialty Exam: Physical Exam  Review of Systems  Constitutional: Negative for fever and chills.  Gastrointestinal: Positive for abdominal pain.  Genitourinary: Negative.   Psychiatric/Behavioral: Positive for depression and suicidal ideas.    Blood pressure 108/71, pulse 62, temperature 98.2 F (36.8 C), temperature source Oral, resp. rate 18, height 5\' 3"  (1.6 m), weight 73.483 kg (162 lb), SpO2 100.00%.Body mass index is 28.7 kg/(m^2).  General Appearance: Casual  Eye Contact::  Good  Speech:  Clear and Coherent  Volume:  Normal  Mood:  Depressed  Affect:  Congruent  Thought Process:  Linear and Logical  Orientation:  Full (Time, Place, and Person)  Thought Content:  WDL  Suicidal Thoughts:  Yes.  without intent/plan  Homicidal Thoughts:  No  Memory:  Immediate;   Good Recent;   Good Remote;   Good  Judgement:  Fair  Insight:  Present  Psychomotor Activity:  Normal  Concentration:  Good  Recall:  Good  Fund of Knowledge:Good  Language: Good  Akathisia:  No  Handed:  Right  AIMS (if indicated):     Assets:  Communication Skills Desire for Improvement Housing Leisure Time Social Support  Sleep:  Number of Hours: 5   Musculoskeletal: Strength & Muscle Tone: within normal limits Gait & Station: normal Patient leans: N/A  COGNITIVE  FEATURES THAT CONTRIBUTE TO RISK:  n/a  SUICIDE RISK:   Moderate:  Frequent suicidal ideation with limited intensity, and duration, some specificity in terms of plans, no associated intent, good self-control, limited dysphoria/symptomatology, some risk factors present, and identifiable protective factors, including available and accessible social support.  PLAN OF CARE:  I certify that inpatient services furnished can reasonably be expected to improve the patient's condition.  Charlcie Cradle 07/23/2013, 3:30 PM

## 2013-07-23 NOTE — H&P (Signed)
I agree with the assessment and plan as documented above.

## 2013-07-23 NOTE — Progress Notes (Signed)
D) Pt rates  her depression at a 3 and her hopelessness at a 8. Denies SI and HI. States that she is really getting a lot out of the groups today. Feels positive that she has learned mostly that she is important and that she can say 'no' to people. States one of her biggest problems is having to feel that she has to do and take care of everyone. A) Given support, reassurance and prasie. Encouraged to continue to work on her packet and to invest in her health by being open to new coping skills. R) Denies SI and HI. Is learning new ways of looking at things
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
## 2013-07-23 NOTE — Progress Notes (Signed)
Writer has observed patient up and active on the unit, she attended group this evening and reports that she has gotten a lot of good information from them. She reports that her goal after discharge si to take better care of herself and stop worrying about her grandson so much. She currently denies si/hi/a/v hallucinations. She is compliant with her scheduled medication. Encouraged her to find a hobby or something she would enjoy doing in her spare time to help keep her mind off worrying so much. Safety maintained on unit with 15 min checks.

## 2013-07-23 NOTE — BHH Group Notes (Signed)
[facility] Group Notes:  (Clinical Social Work)  07/23/2013   1:15-2:15PM  Summary of Progress/Problems:   The main focus of today's process group was for the patient to identify ways in which they have sabotaged their own mental health wellness/recovery.  Motivational interviewing and a handout were used to explore the benefits and costs of their self-sabotaging behavior as well as the benefits and costs of changing this behavior.  The Stages of Change were explained to the group using a handout, and patients identified where they are with regard to changing self-defeating behaviors.  The patient expressed she self-sabotages with peple pleasing and withdrawing from others.  Type of Therapy:  Process Group  Participation Level:  Active  Participation Quality:  Attentive and Sharing  Affect:  Blunted and Depressed  Cognitive:  Disorganized  Insight:  Developing/Improving  Engagement in Therapy:  Engaged  Modes of Intervention:  Education, Motivational Interviewing   Selmer Dominion, LCSW 07/23/2013, 4:00pm

## 2013-07-24 MED ORDER — IBUPROFEN 600 MG PO TABS
600.0000 mg | ORAL_TABLET | Freq: Four times a day (QID) | ORAL | Status: DC | PRN
  Administered 2013-07-24 – 2013-07-25 (×3): 600 mg via ORAL
  Filled 2013-07-24 (×4): qty 1

## 2013-07-24 MED ORDER — TRAZODONE HCL 50 MG PO TABS
50.0000 mg | ORAL_TABLET | Freq: Every day | ORAL | Status: DC
  Administered 2013-07-24: 50 mg via ORAL
  Filled 2013-07-24 (×3): qty 1

## 2013-07-24 MED ORDER — BISACODYL 5 MG PO TBEC
10.0000 mg | DELAYED_RELEASE_TABLET | Freq: Every day | ORAL | Status: DC | PRN
  Administered 2013-07-24: 10 mg via ORAL
  Filled 2013-07-24: qty 2

## 2013-07-24 NOTE — Progress Notes (Addendum)
D Brittany Jimenez is seen out in the milieu...she interacts well. She laughs and jokes with other patients and she takes her meds as scheduled. SHe is given ducolax ( for constipation), advil ( for L knee pain) and trazadone increase ( c/o insomnia).    A Brittany Jimenez attends her groups, is engaged in her poc and is talking about going home next week, and getting to " be ready". SHe takes her medications as scheduled and she completes her slef inventory and on it she writes she denies  SI, she rates her derpession and hopelessness "7/ " and says her DC plan is to " think positive about myself and feel good about myslef".   R Safety is in place and poc moves forward.

## 2013-07-24 NOTE — Progress Notes (Signed)
[facility] MD Progress Note  07/24/2013 12:45 PM Brittany Jimenez  MRN:  [id number] Subjective:  [place] but I have issues Spoke with pt and reviewed chart. Pt has been participating and attending groups.   Pt has a rash at the site where blood was drawn on Friday. Pt is wondering if it is due to the tape. Rash came on Saturday. Denies itching and states it is not spreading.   States knee and ankle pain continue. States pain meds and hot packs are not helping. States Vicodin has helped in the past.  Reports she is constipated since Thursday.  Anxiety and depression are improving.   Admission has been a positive experience and she is benefiting. Feels supported and she is able to relate to people. She is future oriented and goal directed   Denies SE from meds.   Diagnosis:  Major Depression, Recurrent severe and Marijuana abuse   Total Time spent with patient: 20 minutes    ADL's:  Intact  Sleep: Poor pt is asking for dose change   Appetite:  Good  Suicidal Ideation: denies  Homicidal Ideation: denies AEB (as evidenced by):  Psychiatric Specialty Exam: Physical Exam  Review of Systems  Eyes: Negative for blurred vision.  Respiratory: Negative for cough.   Cardiovascular: Negative for chest pain.  Gastrointestinal: Positive for constipation.  Musculoskeletal: Positive for joint pain.  Neurological: Negative for dizziness and headaches.  Psychiatric/Behavioral: Negative for suicidal ideas and hallucinations. The patient has insomnia. The patient is not nervous/anxious.     Blood pressure 142/93, pulse 62, temperature 97.9 F (36.6 C), temperature source Oral, resp. rate 18, height 5' 3"  (1.6 m), weight 73.483 kg (162 lb), SpO2 100.00%.Body mass index is 28.7 kg/(m^2).  General Appearance: Neat  Eye Contact::  Good  Speech:  Clear and Coherent and Normal Rate  Volume:  Normal  Mood:  Euthymic  Affect:  Full Range  Thought Process:  Linear and Logical  Orientation:  Full  (Time, Place, and Person)  Thought Content:  WDL  Suicidal Thoughts:  No  Homicidal Thoughts:  No  Memory:  Immediate;   Fair Recent;   Fair Remote;   Fair  Judgement:  Good  Insight:  Good  Psychomotor Activity:  Normal  Concentration:  Good  Recall:  Good  Fund of Knowledge:Good  Language: Good  Akathisia:  No  Handed:  Right  AIMS (if indicated):     Assets:  Communication Skills Desire for Improvement  Sleep:  Number of Hours: 5.5   Musculoskeletal: Strength & Muscle Tone: within normal limits Gait & Station: normal Patient leans: N/A  Current Medications: Current Facility-Administered Medications  Medication Dose Route Frequency Provider Last Rate Last Dose  . acetaminophen (TYLENOL) tablet 650 mg  650 mg Oral Q6H PRN Hampton Abbot, MD   650 mg at 07/24/13 0325  . alum & mag hydroxide-simeth (MAALOX/MYLANTA) 200-200-20 MG/5ML suspension 30 mL  30 mL Oral Q4H PRN Hampton Abbot, MD      . buPROPion (WELLBUTRIN XL) 24 hr tablet 300 mg  300 mg Oral Daily Hampton Abbot, MD   300 mg at 07/24/13 0849  . busPIRone (BUSPAR) tablet 5 mg  5 mg Oral BID Hampton Abbot, MD   5 mg at 07/24/13 0849  . ciprofloxacin (CIPRO) tablet 250 mg  250 mg Oral BID Delfin Gant, NP   250 mg at 07/24/13 0849  . hydrochlorothiazide (HYDRODIURIL) tablet 25 mg  25 mg Oral Daily Hampton Abbot, MD   25 mg  at 07/24/13 0849  . LORazepam (ATIVAN) tablet 0.25 mg  0.25 mg Oral Q8H PRN Hampton Abbot, MD      . magnesium hydroxide (MILK OF MAGNESIA) suspension 30 mL  30 mL Oral Daily PRN Hampton Abbot, MD      . nicotine (NICODERM CQ - dosed in mg/24 hours) patch 21 mg  21 mg Transdermal Q0600 Hampton Abbot, MD      . traZODone (DESYREL) tablet 25 mg  25 mg Oral QHS Lurena Nida, NP   25 mg at 07/23/13 2050    Lab Results:  Results for orders placed during the hospital encounter of 07/22/13 (from the past 48 hour(s))  URINALYSIS, ROUTINE W REFLEX MICROSCOPIC     Status: Abnormal   Collection Time     07/22/13  2:04 PM      Result Value Ref Range   Color, Urine YELLOW  YELLOW   APPearance TURBID (*) CLEAR   Specific Gravity, Urine 1.025  1.005 - 1.030   pH 6.0  5.0 - 8.0   Glucose, UA NEGATIVE  NEGATIVE mg/dL   Hgb urine dipstick LARGE (*) NEGATIVE   Bilirubin Urine NEGATIVE  NEGATIVE   Ketones, ur NEGATIVE  NEGATIVE mg/dL   Protein, ur NEGATIVE  NEGATIVE mg/dL   Urobilinogen, UA 1.0  0.0 - 1.0 mg/dL   Nitrite NEGATIVE  NEGATIVE   Leukocytes, UA TRACE (*) NEGATIVE   Comment: Performed at [place], URINE     Status: None   Collection Time    07/22/13  2:04 PM      Result Value Ref Range   Preg Test, Ur NEGATIVE  NEGATIVE   Comment:            THE SENSITIVITY OF THIS     METHODOLOGY IS >20 mIU/mL.     Performed at [facility] (HOSP PERFORMED)     Status: Abnormal   Collection Time    07/22/13  2:04 PM      Result Value Ref Range   Opiates NONE DETECTED  NONE DETECTED   Cocaine NONE DETECTED  NONE DETECTED   Benzodiazepines NONE DETECTED  NONE DETECTED   Amphetamines NONE DETECTED  NONE DETECTED   Tetrahydrocannabinol POSITIVE (*) NONE DETECTED   Barbiturates NONE DETECTED  NONE DETECTED   Comment:            DRUG SCREEN FOR MEDICAL PURPOSES     ONLY.  IF CONFIRMATION IS NEEDED     FOR ANY PURPOSE, NOTIFY LAB     WITHIN 5 DAYS.                LOWEST DETECTABLE LIMITS     FOR URINE DRUG SCREEN     Drug Class       Cutoff (ng/mL)     Amphetamine      1000     Barbiturate      200     Benzodiazepine   003     Tricyclics       704     Opiates          300     Cocaine          300     THC              50     Performed at [place]     Status: Abnormal  Collection Time    07/22/13  2:04 PM      Result Value Ref Range   Squamous Epithelial / LPF RARE  RARE   WBC, UA 3-6  <3 WBC/hpf   Bacteria, UA MANY (*) RARE   Urine-Other AMORPHOUS  URATES/PHOSPHATES     Comment: Performed at [facility]  CBC     Status: Abnormal   Collection Time    07/22/13  7:54 PM      Result Value Ref Range   WBC 11.9 (*) 4.0 - 10.5 K/uL   RBC 4.94  3.87 - 5.11 MIL/uL   Hemoglobin 13.5  12.0 - 15.0 g/dL   HCT 40.4  36.0 - 46.0 %   MCV 81.8  78.0 - 100.0 fL   MCH 27.3  26.0 - 34.0 pg   MCHC 33.4  30.0 - 36.0 g/dL   RDW 14.0  11.5 - 15.5 %   Platelets 224  150 - 400 K/uL   Comment: Performed at [facility]     Status: Abnormal   Collection Time    07/22/13  7:54 PM      Result Value Ref Range   Sodium 140  137 - 147 mEq/L   Potassium 3.8  3.7 - 5.3 mEq/L   Chloride 101  96 - 112 mEq/L   CO2 28  19 - 32 mEq/L   Glucose, Bld 96  70 - 99 mg/dL   BUN 16  6 - 23 mg/dL   Creatinine, Ser 0.94  0.50 - 1.10 mg/dL   Calcium 10.0  8.4 - 10.5 mg/dL   Total Protein 8.0  6.0 - 8.3 g/dL   Albumin 3.8  3.5 - 5.2 g/dL   AST 17  0 - 37 U/L   ALT 12  0 - 35 U/L   Alkaline Phosphatase 74  39 - 117 U/L   Total Bilirubin <0.2 (*) 0.3 - 1.2 mg/dL   GFR calc non Af Amer 70 (*) >90 mL/min   GFR calc Af Amer 81 (*) >90 mL/min   Comment: (NOTE)     The eGFR has been calculated using the CKD EPI equation.     This calculation has not been validated in all clinical situations.     eGFR's persistently <90 mL/min signify possible Chronic Kidney     Disease.   Anion gap 11  5 - 15   Comment: Performed at [facility]  TSH     Status: None   Collection Time    07/22/13  7:54 PM      Result Value Ref Range   TSH 0.435  0.350 - 4.500 uIU/mL   Comment: Performed at [facility]    Physical Findings: AIMS: Facial and Oral Movements Muscles of Facial Expression: None, normal Lips and Perioral Area: None, normal Jaw: None, normal Tongue: None, normal,Extremity Movements Upper (arms, wrists, hands, fingers): None, normal Lower (legs, knees, ankles, toes): None,  normal, Trunk Movements Neck, shoulders, hips: None, normal, Overall Severity Severity of abnormal movements (highest score from questions above): None, normal Incapacitation due to abnormal movements: None, normal Patient's awareness of abnormal movements (rate only patient's report): No Awareness, Dental Status Current problems with teeth and/or dentures?: No Does patient usually wear dentures?: No  CIWA:    COWS:     Treatment Plan Summary: Daily contact with patient to assess and evaluate symptoms and progress in treatment Medication management  Plan:Continue taking Wellbutrin  300 XL, 24 HRS mg po daily for depression  Buspar 5 mg po bid for anxiety  Increase Trazodone to 50 mg po at bed time for sleep  Lorazpam 0.25 mg po every 8 hours as needed for anxiety Cipro 258m BID x 7days for UTI Ibuprofen for pain Feels that she is ready for discharge on Monday.    Medical Decision Making Problem Points:  Established problem, stable/improving (1) and Review of psycho-social stressors (1) Data Points:  Review of medication regiment & side effects (2) Review of new medications or change in dosage (2)  I certify that inpatient services furnished can reasonably be expected to improve the patient's condition.   ACharlcie Cradle7/12/2013, 12:45 PM

## 2013-07-24 NOTE — Progress Notes (Signed)
[facility] Group Notes:  (Nursing/MHT/Case Management/Adjunct)  Date:  07/24/2013  Time:  12:16 AM  Type of Therapy:  Psychoeducational Skills  Participation Level:  Active  Participation Quality:  Appropriate  Affect:  Appropriate  Cognitive:  Appropriate  Insight:  Good  Engagement in Group:  Improving  Modes of Intervention:  Education  Summary of Progress/Problems: The patient had a much brighter affect in group and was more spontaneous. The patient verbalized that she was pleased with the fact that she went to the gym this afternoon and that she was able to play checkers with one of her peers. She also mentioned that she is actively trying to keep herself busy and approach her day by doing things versus thinking about what needs to be done. As a theme for the day, her coping skill is to keep her mind focused.    Donaciano Range S 07/24/2013, 12:16 AM

## 2013-07-24 NOTE — Progress Notes (Signed)
Adult Psychoeducational Group Note  Date:  07/24/2013 Time:  10:05 PM  Group Topic/Focus:  Wrap-Up Group:   The focus of this group is to help patients review their daily goal of treatment and discuss progress on daily workbooks.  Participation Level:  Active  Participation Quality:  Appropriate and Attentive  Affect:  Appropriate  Cognitive:  Alert and Appropriate  Insight: Appropriate  Engagement in Group:  Engaged  Modes of Intervention:  Discussion, Education and Support  Additional Comments:  Pt was active during this group and rated her day at a 22. Pt stated her goal was to be able to leave tomorrow and follow all the material given to her. Pt plans to take one day at a time, not to become angry so easily, and not to second guess herself.   Rocky Crafts 07/24/2013, 10:05 PM

## 2013-07-24 NOTE — Progress Notes (Signed)
Psychoeducational Group Note  Date:  07/24/2013 Time:  1015  Group Topic/Focus:  Making Healthy Choices:   The focus of this group is to help patients identify negative/unhealthy choices they were using prior to admission and identify positive/healthier coping strategies to replace them upon discharge.  Participation Level:  Active  Participation Quality:  Appropriate  Affect:  Appropriate  Cognitive:  Oriented  Insight:  Improving  Engagement in Group:  Engaged  Additional Comments:    Paulino Rily 07/24/2013

## 2013-07-24 NOTE — Progress Notes (Signed)
Psychoeducational Group Note  Date: 07/24/2013  Time: 0930  Group Topic/Focus:  Gratefulness: The focus of this group is to help patients identify what two things they are most grateful for in their lives. What helps ground them and to center them on their work to their recovery.  Participation Level: Active  Participation Quality: Appropriate  Affect: Appropriate  Cognitive: Oriented  Insight: Improving  Engagement in Group: Engaged  Additional Comments:  Paulino Rily

## 2013-07-24 NOTE — BHH Group Notes (Signed)
[place] Group Notes:  (Clinical Social Work)  07/24/2013   1:15-2:15PM  Summary of Progress/Problems:  The main focus of today's process group was to   identify the patient's current support system and decide on other supports that can be put in place.  The picture on workbook was used to discuss why additional supports are needed.  An emphasis was placed on using counselor, doctor, therapy groups, 12-step groups, and problem-specific support groups to expand supports.   There was also an extensive discussion about what constitutes a healthy support versus an unhealthy support.  The patient expressed full comprehension of the concepts presented.  Current healthy supports were not mentioned except that she goes to [facility] to group therapy, stating that there are 13 people in the group and therefore she does not get individual attention.  CSW reminded her to ask for individual therapy time.  She stated it was weeks out, and CSW reminded her that was because she was insisting on only doing it on Thursdays.  She agreed to try again.  She also was interested in groups and classes going on at the [facility].  Type of Therapy:  Process Group  Participation Level:  Active  Participation Quality:  Attentive and Sharing  Affect:  Blunted and Anxious  Cognitive:  Appropriate and Oriented  Insight:  Engaged  Engagement in Therapy:  Engaged  Modes of Intervention:  Education,  Support and [employer], LCSW 07/24/2013, 4:00pm

## 2013-07-25 ENCOUNTER — Ambulatory Visit: Payer: Self-pay | Admitting: Internal Medicine

## 2013-07-25 DIAGNOSIS — F332 Major depressive disorder, recurrent severe without psychotic features: Principal | ICD-10-CM

## 2013-07-25 MED ORDER — CIPROFLOXACIN HCL 250 MG PO TABS: 250.0000 mg | ORAL_TABLET | Freq: Two times a day (BID) | ORAL | Status: DC

## 2013-07-25 MED ORDER — BUPROPION HCL ER (XL) 300 MG PO TB24: 300.0000 mg | ORAL_TABLET | Freq: Every day | ORAL | Status: DC

## 2013-07-25 MED ORDER — TRAZODONE HCL 50 MG PO TABS: 50.0000 mg | ORAL_TABLET | Freq: Every day | ORAL | Status: DC

## 2013-07-25 MED ORDER — BUSPIRONE HCL 5 MG PO TABS: 5.0000 mg | ORAL_TABLET | Freq: Two times a day (BID) | ORAL | Status: DC

## 2013-07-25 MED ORDER — HYDROCHLOROTHIAZIDE 25 MG PO TABS: 25.0000 mg | ORAL_TABLET | Freq: Every day | ORAL | Status: DC

## 2013-07-25 NOTE — Progress Notes (Signed)
[facility] Adult Case Management Discharge Plan :  Will you be returning to the same living situation after discharge: Yes,  Patient is returning to her home. At discharge, do you have transportation home?:Yes,  Husband to transport patient home. Do you have the ability to pay for your medications:No.  Patient needs assistance with indigent medications   Release of information consent forms completed and in the chart;  Patient's signature needed at discharge.  Patient to Follow up at: Follow-up Information   Follow up with [facility] on Monday, August 01, 2013 at 10 AM)    Contact information:   [address], [place]   [address]  [phone]        Follow up with [facility] On 07/26/2013. (Please go to [facility] walk in clinic on Tuesday, July 26, 2013 or any weekday between [place] for medication managment )    Contact information:   [address] [place], [place]   [address]  [phone]      Patient denies SI/HI: Patient no longer endorsing SI/HI or other thoughts of self harm.   Safety Planning and Suicide Prevention discussed: .Reviewed with all patients during discharge planning group   Leilene Diprima, Eulas Post 07/25/2013, 11:35 AM

## 2013-07-25 NOTE — [facility] Suicide Risk Assessment (Signed)
Demographic Factors:  48 year old married female, has adult children and grandchild  Total Time spent with patient: 30 minutes  Psychiatric Specialty Exam: Physical Exam  ROS  Blood pressure 119/94, pulse 83, temperature 98.4 F (36.9 C), temperature source Oral, resp. rate 18, height 5\' 3"  (1.6 m), weight 73.483 kg (162 lb), SpO2 100.00%.Body mass index is 28.7 kg/(m^2).  General Appearance: Well Groomed  Engineer, water::  Good  Speech:  Normal Rate  Volume:  Normal  Mood:  States it is improved and currently denies depression  Affect:  Appropriate and reactive   Thought Process:  Goal Directed and Linear  Orientation:  NA- fully alert and attentive   Thought Content:  Denies hallucinations and no delusions  Suicidal Thoughts:  No- denies any thoughts of hurting herself or anyone else   Homicidal Thoughts:  No  Memory:  NA  Judgement:  Fair  Insight:  Fair  Psychomotor Activity:  Normal  Concentration:  Good  Recall:  Good  Fund of Knowledge:Good  Language: Good  Akathisia:  Negative  Handed:  Right  AIMS (if indicated):     Assets:  Communication Skills Desire for Improvement Resilience Social Support  Sleep:  Number of Hours: 6.25    Musculoskeletal: Strength & Muscle Tone: within normal limits Gait & Station: normal Patient leans: N/A   Mental Status Per Nursing Assessment::   On Admission:  Suicidal ideation indicated by patient  Current Mental Status by Physician: Upon discharge, patient states she is feeling better and presents with improved mood and affect. She is not suicidal or homicidal or psychotic . She is future oriented.   Loss Factors: Strained relationship with daughter, leading to not being able to see her grandson. Some marital distancing.  Historical Factors: History of Depression  Risk Reduction Factors:   Sense of responsibility to family, Living with another person, especially a relative and Positive social support  Continued  Clinical Symptoms:  Reports improved mood and currently minimizes depression or neuro-vegetative symptoms of depression  Cognitive Features That Contribute To Risk:  No gross cognitive difficulties noted upon discharge  Suicide Risk:  Mild:  Suicidal ideation of limited frequency, intensity, duration, and specificity.  There are no identifiable plans, no associated intent, mild dysphoria and related symptoms, good self-control (both objective and subjective assessment), few other risk factors, and identifiable protective factors, including available and accessible social support.  Discharge Diagnoses:   AXIS I:  Major Depression, Recurrent severe AXIS II:  Deferred AXIS III:   Past Medical History  Diagnosis Date  . Obesity   . Chronic pain   . Depression   . Arthritis   . Asthma   . Hypertension   . Anxiety    AXIS IV:  problems related to social environment AXIS V:  51-60 moderate symptoms ( 60 upon discharge)   Plan Of Care/Follow-up recommendations:  Activity:  As tolerated  Diet:  low sodium, heart healthy Tests:  NA Other:  See below  Is patient on multiple antipsychotic therapies at discharge:  No   Has Patient had three or more failed trials of antipsychotic monotherapy by history:  No  Recommended Plan for Multiple Antipsychotic Therapies: NA  Patient  Requesting discharge home. States she is feeling much better and looking forward to reuniting with husband.  At this time no SI or HI or psychosis, and behavior in good control. No current grounds for any involuntary commitment. Patient explains that she spoke with husband and he was supportive  and agreed to make effort to be closer emotionally and to be more supportive. This contributed to her feeling better. We reviewed medication side effects ( Wellbutrin and Buspar)- Denies side effects. She will follow up at [facility].    COBOS, FERNANDO 07/25/2013, 11:54 AM

## 2013-07-25 NOTE — Progress Notes (Signed)
Writer has observed patient up and active on the unit, she attended group this evening. She is compliant with her medications and is hopeful to discharge on tomorrow. She c/o arthritis pain in her knees and received ibuprofen and ice packs. Encouraged patient to follow through with her goals once discharged and she reports she plans to. She denies si/hi/a/v hallucinations. Safety maintained on unit with 15 min checks.

## 2013-07-25 NOTE — [facility] Suicide Risk Assessment (Signed)
[place] INPATIENT:  Family/Significant Other Suicide Prevention Education  Suicide Prevention Education:  Education Completed; Karsten Ro, [phone];  has been identified by the patient as the family member/significant other with whom the patient will be residing, and identified as the person(s) who will aid the patient in the event of a mental health crisis (suicidal ideations/suicide attempt).  With written consent from the patient, the family member/significant other has been provided the following suicide prevention education, prior to the and/or following the discharge of the patient.  The suicide prevention education provided includes the following:  Suicide risk factors  Suicide prevention and interventions  National Suicide Hotline telephone number  [facility] assessment telephone number  [facility] Emergency Assistance [place] and/or Residential Mobile Crisis Unit telephone number  Request made of family/significant other to:  Remove weapons (e.g., guns, rifles, knives), all items previously/currently identified as safety concern.  Husband advised patient does not have access to weapons.    Remove drugs/medications (over-the-counter, prescriptions, illicit drugs), all items previously/currently identified as a safety concern.  The family member/significant other verbalizes understanding of the suicide prevention education information provided.  The family member/significant other agrees to remove the items of safety concern listed above.  Concha Pyo 07/25/2013, 11:27 AM

## 2013-07-25 NOTE — [facility] Group Notes (Signed)
[facility] LCSW Aftercare Discharge Planning Group Note   07/25/2013 11:36 AM    Participation Quality:  [place]ropraite  Mood/Affect:  Appropriate  Depression Rating:  3  Anxiety Rating:  3  Thoughts of Suicide:  No  Will you contract for safety?   NA  Current AVH:  No  Plan for Discharge/Comments:  Patient attended discharge planning group and actively participated in group.  She will return to her home and follow [facility] with [facility].  CSW provided all participants with daily workbook.   Transportation Means: Patient has transportation.   Supports:  Patient has a support system.   Brittany Jimenez, [place]

## 2013-07-25 NOTE — Progress Notes (Signed)
Patient ID: Brittany Jimenez, female   DOB: 12/10/1965, 48 y.o.   MRN: [id number]  Pt. Denies SI/HI and A/V hallucinations. Patient reports her depression at 8/10 and hopelessness at  7/10 for the day. Belongings returned to patient at time of discharge. Patient denies any new pain or discomfort. Discharge instructions and medications were reviewed with patient. Patient verbalized understanding of both medications and discharge instructions. Q15 minute safety checks maintained until discharge. No distress noted upon discharge.

## 2013-07-28 NOTE — Progress Notes (Signed)
Patient Discharge Instructions:  After Visit Summary (AVS):   Faxed to:  07/28/13 Psychiatric Admission Assessment Note:   Faxed to:  07/28/13 Suicide Risk Assessment - Discharge Assessment:   Faxed to:  07/28/13 Faxed/Sent to the Next Level Care provider:  07/28/13 Faxed to [place] @ [phone] Faxed to [facility] @ [place], 07/28/2013, 1:16 PM

## 2013-08-09 ENCOUNTER — Encounter: Payer: Self-pay | Admitting: Internal Medicine

## 2013-08-09 ENCOUNTER — Ambulatory Visit: Payer: No Typology Code available for payment source | Attending: Internal Medicine | Admitting: Internal Medicine

## 2013-08-09 VITALS — BP 128/78 | HR 82 | Temp 98.4°F | Resp 16 | Wt 165.0 lb

## 2013-08-09 DIAGNOSIS — F329 Major depressive disorder, single episode, unspecified: Secondary | ICD-10-CM | POA: Insufficient documentation

## 2013-08-09 DIAGNOSIS — F17200 Nicotine dependence, unspecified, uncomplicated: Secondary | ICD-10-CM | POA: Insufficient documentation

## 2013-08-09 DIAGNOSIS — H538 Other visual disturbances: Secondary | ICD-10-CM

## 2013-08-09 DIAGNOSIS — F419 Anxiety disorder, unspecified: Secondary | ICD-10-CM | POA: Insufficient documentation

## 2013-08-09 DIAGNOSIS — F411 Generalized anxiety disorder: Secondary | ICD-10-CM

## 2013-08-09 DIAGNOSIS — J45909 Unspecified asthma, uncomplicated: Secondary | ICD-10-CM | POA: Insufficient documentation

## 2013-08-09 DIAGNOSIS — I10 Essential (primary) hypertension: Secondary | ICD-10-CM

## 2013-08-09 DIAGNOSIS — F3289 Other specified depressive episodes: Secondary | ICD-10-CM | POA: Insufficient documentation

## 2013-08-09 DIAGNOSIS — M25562 Pain in left knee: Secondary | ICD-10-CM

## 2013-08-09 DIAGNOSIS — G4700 Insomnia, unspecified: Secondary | ICD-10-CM

## 2013-08-09 DIAGNOSIS — M2550 Pain in unspecified joint: Secondary | ICD-10-CM | POA: Insufficient documentation

## 2013-08-09 DIAGNOSIS — M25561 Pain in right knee: Secondary | ICD-10-CM | POA: Insufficient documentation

## 2013-08-09 DIAGNOSIS — M25569 Pain in unspecified knee: Secondary | ICD-10-CM

## 2013-08-09 MED ORDER — HYDROCHLOROTHIAZIDE 25 MG PO TABS: 25.0000 mg | ORAL_TABLET | Freq: Every day | ORAL | Status: DC

## 2013-08-09 MED ORDER — TRAZODONE HCL 100 MG PO TABS: 100.0000 mg | ORAL_TABLET | Freq: Every day | ORAL | Status: DC

## 2013-08-09 MED ORDER — IBUPROFEN 600 MG PO TABS: 600.0000 mg | ORAL_TABLET | Freq: Three times a day (TID) | ORAL | Status: DC | PRN

## 2013-08-09 NOTE — Progress Notes (Signed)
MRN: [id number] Name: Brittany Jimenez  Sex: female Age: 48 y.o. DOB: 15-May-1965  Allergies: Review of patient's allergies indicates no known allergies.  Chief Complaint  Patient presents with  . Follow-up    HPI: Patient is 48 y.o. female who has history of hypertension anxiety depression, insomnia patient had psychiatric hospitalization 2 weeks ago with suicidal ideation, EMR reviewed patient was started on Wellbutrin BuSpar trazodone, today patient reports still lot  of anxiety symptoms , yesterday she took BuSpar 3 times and she has been prescribed trazodone 50 mg each bedtime as per patient it does not help much and is requesting prescription for lorazepam and Ambien ( since with recent hospitalization she had suicidal ideation to overdose on medications), I had a long discussion with the patient that I will not prescribe these medications, she should be following up  with the psychiatrist patient understands and is agreeable upon making trazodone 100 mg each bedtime. patient also history of hypertension and is requesting refill on hydrochlorothiazide , she also reported to have blurry vision and is requesting to see an ophthalmologist. Patient also has arthritic pain and is requesting refill on ibuprofen.  Past Medical History  Diagnosis Date  . Obesity   . Chronic pain   . Depression   . Arthritis   . Asthma   . Hypertension   . Anxiety     Past Surgical History  Procedure Laterality Date  . Appendectomy    . Tubal ligation  1995      Medication List       This list is accurate as of: 08/09/13 12:05 PM.  Always use your most recent med list.               albuterol 108 (90 BASE) MCG/ACT inhaler  Commonly known as:  PROVENTIL HFA;VENTOLIN HFA  Inhale 2 puffs into the lungs every 6 (six) hours as needed. For shortness of breath     buPROPion 300 MG 24 hr tablet  Commonly known as:  WELLBUTRIN XL  Take 1 tablet (300 mg total) by mouth daily.     busPIRone  5 MG tablet  Commonly known as:  BUSPAR  Take 1 tablet (5 mg total) by mouth 2 (two) times daily.     ciprofloxacin 250 MG tablet  Commonly known as:  CIPRO  Take 1 tablet (250 mg total) by mouth 2 (two) times daily.     hydrochlorothiazide 25 MG tablet  Commonly known as:  HYDRODIURIL  Take 1 tablet (25 mg total) by mouth daily.     ibuprofen 600 MG tablet  Commonly known as:  ADVIL,MOTRIN  Take 1 tablet (600 mg total) by mouth every 8 (eight) hours as needed.     traZODone 100 MG tablet  Commonly known as:  DESYREL  Take 1 tablet (100 mg total) by mouth at bedtime.        Meds ordered this encounter  Medications  . traZODone (DESYREL) 100 MG tablet    Sig: Take 1 tablet (100 mg total) by mouth at bedtime.    Dispense:  30 tablet    Refill:  0  . hydrochlorothiazide (HYDRODIURIL) 25 MG tablet    Sig: Take 1 tablet (25 mg total) by mouth daily.    Dispense:  30 tablet    Refill:  3  . ibuprofen (ADVIL,MOTRIN) 600 MG tablet    Sig: Take 1 tablet (600 mg total) by mouth every 8 (eight) hours as needed.    Dispense:  30 tablet    Refill:  1     There is no immunization history on file for this patient.  Family History  Problem Relation Age of Onset  . Heart disease Mother   . Early death Mother   . Asthma Mother   . Diabetes Mother   . Hypertension Mother   . Cancer Father   . Alcohol abuse Father   . Early death Father   . Hypertension Father   . Asthma Sister   . Cancer Sister   . Hypertension Sister   . Depression Brother   . Drug abuse Brother   . Hypertension Brother     History  Substance Use Topics  . Smoking status: Current Every Day Smoker -- 0.50 packs/day    Types: Cigarettes  . Smokeless tobacco: Not on file  . Alcohol Use: No    Review of Systems   As noted in HPI  Filed Vitals:   08/09/13 1126  BP: 128/78  Pulse: 82  Temp: 98.4 F (36.9 C)  Resp: 16    Physical Exam  Physical Exam  Constitutional: No distress.  Eyes:  EOM are normal. Pupils are equal, round, and reactive to light.  Cardiovascular: Normal rate and regular rhythm.   Pulmonary/Chest: Breath sounds normal. No respiratory distress. She has no wheezes. She has no rales.  Musculoskeletal: She exhibits no edema.    CBC    Component Value Date/Time   WBC 11.9* 07/22/2013 1954   RBC 4.94 07/22/2013 1954   HGB 13.5 07/22/2013 1954   HGB 14.4 06/29/2012 1355   HCT 40.4 07/22/2013 1954   PLT 224 07/22/2013 1954   MCV 81.8 07/22/2013 1954   LYMPHSABS 2.4 05/22/2011 1730   MONOABS 0.6 05/22/2011 1730   EOSABS 0.5 05/22/2011 1730   BASOSABS 0.0 05/22/2011 1730    CMP     Component Value Date/Time   NA 140 07/22/2013 1954   K 3.8 07/22/2013 1954   CL 101 07/22/2013 1954   CO2 28 07/22/2013 1954   GLUCOSE 96 07/22/2013 1954   BUN 16 07/22/2013 1954   CREATININE 0.94 07/22/2013 1954   CREATININE 0.83 12/03/2012 1459   CALCIUM 10.0 07/22/2013 1954   PROT 8.0 07/22/2013 1954   ALBUMIN 3.8 07/22/2013 1954   AST 17 07/22/2013 1954   ALT 12 07/22/2013 1954   ALKPHOS 74 07/22/2013 1954   BILITOT <0.2* 07/22/2013 1954   GFRNONAA 70* 07/22/2013 1954   GFRNONAA 83 12/03/2012 1459   GFRAA 81* 07/22/2013 1954   GFRAA >89 12/03/2012 1459    No results found for this basename: chol, tri, ldl    No components found with this basename: hga1c    Lab Results  Component Value Date/Time   AST 17 07/22/2013  7:54 PM    Assessment and Plan  Hypertension, essential, benign - Plan: Blood pressure is well controlled advised for DASH diet continue with hydrochlorothiazide (HYDRODIURIL) 25 MG tablet  Anxiety state, unspecified Currently on BuSpar and Wellbutrin patient is going to follow with her psychiatrist  Arthralgia of both knees - Plan: ibuprofen (ADVIL,MOTRIN) 600 MG tablet  Insomnia - Plan: I have increased the dose of trazodone 200 mg each bedtime traZODone (DESYREL) 100 MG tablet  Blurry vision - Plan: Ambulatory referral to Ophthalmology   Return in about 3  months (around 11/09/2013) for hypertension.  Lorayne Marek, MD

## 2013-08-09 NOTE — Patient Instructions (Signed)
DASH Eating Plan °DASH stands for "Dietary Approaches to Stop Hypertension." The DASH eating plan is a healthy eating plan that has been shown to reduce high blood pressure (hypertension). Additional health benefits may include reducing the risk of type 2 diabetes mellitus, heart disease, and stroke. The DASH eating plan may also help with weight loss. °WHAT DO I NEED TO KNOW ABOUT THE DASH EATING PLAN? °For the DASH eating plan, you will follow these general guidelines: °· Choose foods with a percent daily value for sodium of less than 5% (as listed on the food label). °· Use salt-free seasonings or herbs instead of table salt or sea salt. °· Check with your health care provider or pharmacist before using salt substitutes. °· Eat lower-sodium products, often labeled as "lower sodium" or "no salt added." °· Eat fresh foods. °· Eat more vegetables, fruits, and low-fat dairy products. °· Choose whole grains. Look for the word "whole" as the first word in the ingredient list. °· Choose fish and skinless chicken or turkey more often than red meat. Limit fish, poultry, and meat to 6 oz (170 g) each day. °· Limit sweets, desserts, sugars, and sugary drinks. °· Choose heart-healthy fats. °· Limit cheese to 1 oz (28 g) per day. °· Eat more home-cooked food and less restaurant, buffet, and fast food. °· Limit fried foods. °· Cook foods using methods other than frying. °· Limit canned vegetables. If you do use them, rinse them well to decrease the sodium. °· When eating at a restaurant, ask that your food be prepared with less salt, or no salt if possible. °WHAT FOODS CAN I EAT? °Seek help from a dietitian for individual calorie needs. °Grains °Whole grain or whole wheat bread. Brown rice. Whole grain or whole wheat pasta. Quinoa, bulgur, and whole grain cereals. Low-sodium cereals. Corn or whole wheat flour tortillas. Whole grain cornbread. Whole grain crackers. Low-sodium crackers. °Vegetables °Fresh or frozen vegetables  (raw, steamed, roasted, or grilled). Low-sodium or reduced-sodium tomato and vegetable juices. Low-sodium or reduced-sodium tomato sauce and paste. Low-sodium or reduced-sodium canned vegetables.  °Fruits °All fresh, canned (in natural juice), or frozen fruits. °Meat and Other Protein Products °Ground beef (85% or leaner), grass-fed beef, or beef trimmed of fat. Skinless chicken or turkey. Ground chicken or turkey. Pork trimmed of fat. All fish and seafood. Eggs. Dried beans, peas, or lentils. Unsalted nuts and seeds. Unsalted canned beans. °Dairy °Low-fat dairy products, such as skim or 1% milk, 2% or reduced-fat cheeses, low-fat ricotta or cottage cheese, or plain low-fat yogurt. Low-sodium or reduced-sodium cheeses. °Fats and Oils °Tub margarines without trans fats. Light or reduced-fat mayonnaise and salad dressings (reduced sodium). Avocado. Safflower, olive, or canola oils. Natural peanut or almond butter. °Other °Unsalted popcorn and pretzels. °The items listed above may not be a complete list of recommended foods or beverages. Contact your dietitian for more options. °WHAT FOODS ARE NOT RECOMMENDED? °Grains °White bread. White pasta. White rice. Refined cornbread. Bagels and croissants. Crackers that contain trans fat. °Vegetables °Creamed or fried vegetables. Vegetables in a cheese sauce. Regular canned vegetables. Regular canned tomato sauce and paste. Regular tomato and vegetable juices. °Fruits °Dried fruits. Canned fruit in light or heavy syrup. Fruit juice. °Meat and Other Protein Products °Fatty cuts of meat. Ribs, chicken wings, bacon, sausage, bologna, salami, chitterlings, fatback, hot dogs, bratwurst, and packaged luncheon meats. Salted nuts and seeds. Canned beans with salt. °Dairy °Whole or 2% milk, cream, half-and-half, and cream cheese. Whole-fat or sweetened yogurt. Full-fat   cheeses or blue cheese. Nondairy creamers and whipped toppings. Processed cheese, cheese spreads, or cheese  curds. °Condiments °Onion and garlic salt, seasoned salt, table salt, and sea salt. Canned and packaged gravies. Worcestershire sauce. Tartar sauce. Barbecue sauce. Teriyaki sauce. Soy sauce, including reduced sodium. Steak sauce. Fish sauce. Oyster sauce. Cocktail sauce. Horseradish. Ketchup and mustard. Meat flavorings and tenderizers. Bouillon cubes. Hot sauce. Tabasco sauce. Marinades. Taco seasonings. Relishes. °Fats and Oils °Butter, stick margarine, lard, shortening, ghee, and bacon fat. Coconut, palm kernel, or palm oils. Regular salad dressings. °Other °Pickles and olives. Salted popcorn and pretzels. °The items listed above may not be a complete list of foods and beverages to avoid. Contact your dietitian for more information. °WHERE CAN I FIND MORE INFORMATION? °National Heart, Lung, and Blood Institute: www.nhlbi.nih.gov/health/health-topics/topics/dash/ °Document Released: 12/19/2010 Document Revised: 05/16/2013 Document Reviewed: 11/03/2012 °Exit[employer]® Patient Information ©2015 [employer]. This information is not intended to replace advice given to you by your health care provider. Make sure you discuss any questions you have with your health care provider. ° °

## 2013-08-09 NOTE — Progress Notes (Signed)
Patient here for follow up and medication refill Patient is requesting prescriptions for lorazepam for her anxiety and  Ambien to help her sleep Needs referral to opthamology

## 2013-08-18 NOTE — Discharge Summary (Signed)
Physician Discharge Summary Note  Patient:  Brittany Jimenez is an 48 y.o., female MRN:  [id number] DOB:  11/08/1965 Patient phone:  [phone] (home)  Patient address:   [place] [address],  Total Time spent with patient: 30 minutes  Date of Admission:  07/22/2013 Date of Discharge: 07/25/2013  Reason for Admission:  MDD with SI with plan to OD  Discharge Diagnoses: Active Problems:   MDD (major depressive disorder)   Psychiatric Specialty Exam: Physical Exam  ROS  Blood pressure 119/94, pulse 83, temperature 98.4 F (36.9 C), temperature source Oral, resp. rate 18, height 5\' 3"  (1.6 m), weight 73.483 kg (162 lb), SpO2 100.00%.Body mass index is 28.7 kg/(m^2).   General Appearance: Well Groomed   Engineer, water:: Good   Speech: Normal Rate   Volume: Normal   Mood: States it is improved and currently denies depression   Affect: Appropriate and reactive   Thought Process: Goal Directed and Linear   Orientation: NA- fully alert and attentive   Thought Content: Denies hallucinations and no delusions   Suicidal Thoughts: No- denies any thoughts of hurting herself or anyone else   Homicidal Thoughts: No   Memory: NA   Judgement: Fair   Insight: Fair   Psychomotor Activity: Normal   Concentration: Good   Recall: Good   Fund of Knowledge:Good   Language: Good   Akathisia: Negative   Handed: Right   AIMS (if indicated):   Assets: Communication Skills  Desire for Improvement  Resilience  Social Support   Sleep: Number of Hours: 6.25    Musculoskeletal:  Strength & Muscle Tone: within normal limits  Gait & Station: normal  Patient leans: N/A   DSM5:  Depressive Disorders:  Major Depressive Disorder - Severe (296.23)  Axis Diagnosis:   AXIS I:  Major Depression, Recurrent severe AXIS II:  Deferred AXIS III:   Past Medical History  Diagnosis Date  . Obesity   . Chronic pain   . Depression   . Arthritis   . Asthma   . Hypertension   .  Anxiety    AXIS IV:  other psychosocial or environmental problems and problems related to social environment AXIS V:  61-70 mild symptoms  Level of Care:  OP  Hospital Course:  Brittany Jimenez is an 48 y.o. African American female that reports suicidal ideation with a plan to take pills. Patient reports that she has been feeling depressed and anxious for the past month. Patient reports that she does not want to live like this anymore. Patient reports that her husband have been ignoring her and her daughter moved out of her house with her 18 year old grand son. Patient states her daughter have not allowed her speak with her grand son because she told her daughter that she need to pay attention to her 49 year old son. Patient reports her daughter does not bath or feed her 29 year old son and she, as the grand mother did not accept that. Patient reports her daughter accused her of trying to be the mother instead of grand mother to the two year old. Patient was tearful throughout this interview. Patient stated " I am married but feels lonely" Patient reports that her husband does not pay any attention to her and she then spends her time with her friends. Patient states that it hurts her feelings to be neglected by her husband. She denied previous mental health hospitalization. She receives outpatient treatment at [facility]. She denies substance abuse  use or treatment but her UDS was positive for Marijuana. Patient today denies SI/HI/AVH. Patient reports she used to hear voices or her door opened but now with an increase in her Wellbutrin, she does not any more. We will continue to monitor patient and take care of her needs. Patient have been encouraged to attend all groups. Based on her urinalysis result and c/o discomfort when urinating we will start patient on Cipro for 10 days.  During Hospitalization: Medications managed, psychoeducation, group and individual therapy. Pt currently denies SI, HI, and  Psychosis. At discharge, pt rates anxiety and depression as minimal. Pt states that he does have a good supportive home environment and will followup with outpatient treatment. Affirms agreement with medication regimen and discharge plan. Denies other physical and psychological concerns at time of discharge.   Consults:  None  Significant Diagnostic Studies:  None  Discharge Vitals:   Blood pressure 119/94, pulse 83, temperature 98.4 F (36.9 C), temperature source Oral, resp. rate 18, height 5\' 3"  (1.6 m), weight 73.483 kg (162 lb), SpO2 100.00%. Body mass index is 28.7 kg/(m^2). Lab Results:   No results found for this or any previous visit (from the past 72 hour(s)).  Physical Findings: AIMS: Facial and Oral Movements Muscles of Facial Expression: None, normal Lips and Perioral Area: None, normal Jaw: None, normal Tongue: None, normal,Extremity Movements Upper (arms, wrists, hands, fingers): None, normal Lower (legs, knees, ankles, toes): None, normal, Trunk Movements Neck, shoulders, hips: None, normal, Overall Severity Severity of abnormal movements (highest score from questions above): None, normal Incapacitation due to abnormal movements: None, normal Patient's awareness of abnormal movements (rate only patient's report): No Awareness, Dental Status Current problems with teeth and/or dentures?: No Does patient usually wear dentures?: No  CIWA:    COWS:     Psychiatric Specialty Exam: See Psychiatric Specialty Exam and Suicide Risk Assessment completed by Attending Physician prior to discharge.  Discharge destination:  Home  Is patient on multiple antipsychotic therapies at discharge:  No   Has Patient had three or more failed trials of antipsychotic monotherapy by history:  No  Recommended Plan for Multiple Antipsychotic Therapies: NA     Medication List    STOP taking these medications       ALPRAZolam 0.25 MG tablet  Commonly known as:  XANAX      hydrochlorothiazide 25 MG tablet  Commonly known as:  HYDRODIURIL      TAKE these medications     Indication   albuterol 108 (90 BASE) MCG/ACT inhaler  Commonly known as:  PROVENTIL HFA;VENTOLIN HFA  Inhale 2 puffs into the lungs every 6 (six) hours as needed. For shortness of breath      buPROPion 300 MG 24 hr tablet  Commonly known as:  WELLBUTRIN XL  Take 1 tablet (300 mg total) by mouth daily.   Indication:  mood stabilization     busPIRone 5 MG tablet  Commonly known as:  BUSPAR  Take 1 tablet (5 mg total) by mouth 2 (two) times daily.   Indication:  mood stabilization     ciprofloxacin 250 MG tablet  Commonly known as:  CIPRO  Take 1 tablet (250 mg total) by mouth 2 (two) times daily.   Indication:  UTI           Follow-up Information   Follow up with [facility] . Olivia Mackie Anothony on Monday, August 01, 2013 at 10 AM)    Contact information:  [address], [place]   [address]  [phone]        Follow up with [facility] On 07/26/2013. (Please go to [facility] in clinic on Tuesday, July 26, 2013 or any weekday between [place] for medication managment )    Contact information:   [address] [place], [place]   [address]  [phone]      Follow-up recommendations:  Activity:  As tolerated Diet:  Heart healthy with low sodium.  Comments:  Take all medications as prescribed. Keep all follow-up appointments as scheduled.  Do not consume alcohol or use illegal drugs while on prescription medications. Report any adverse effects from your medications to your primary care provider promptly.  In the event of recurrent symptoms or worsening symptoms, call 911, a crisis hotline, or go to the nearest emergency department for evaluation.   Total Discharge Time:  Greater than 30 minutes.  Signed: Benjamine Mola, FNP-BC 07/25/2013, 1:05 PM   Patient seen, Suicide Assessment Completed.  Disposition Plan Reviewed

## 2013-09-06 ENCOUNTER — Emergency Department (HOSPITAL_COMMUNITY)
Admission: EM | Admit: 2013-09-06 | Discharge: 2013-09-06 | Disposition: A | Discharge status: Home / Self Care | Payer: No Typology Code available for payment source | Attending: Emergency Medicine | Admitting: Emergency Medicine

## 2013-09-06 ENCOUNTER — Encounter (HOSPITAL_COMMUNITY): Payer: Self-pay | Admitting: Emergency Medicine

## 2013-09-06 ENCOUNTER — Ambulatory Visit: Payer: No Typology Code available for payment source | Attending: Internal Medicine

## 2013-09-06 DIAGNOSIS — Z8739 Personal history of other diseases of the musculoskeletal system and connective tissue: Secondary | ICD-10-CM | POA: Insufficient documentation

## 2013-09-06 DIAGNOSIS — M25569 Pain in unspecified knee: Secondary | ICD-10-CM | POA: Insufficient documentation

## 2013-09-06 DIAGNOSIS — F3289 Other specified depressive episodes: Secondary | ICD-10-CM | POA: Insufficient documentation

## 2013-09-06 DIAGNOSIS — M25561 Pain in right knee: Secondary | ICD-10-CM

## 2013-09-06 DIAGNOSIS — E669 Obesity, unspecified: Secondary | ICD-10-CM | POA: Insufficient documentation

## 2013-09-06 DIAGNOSIS — F17200 Nicotine dependence, unspecified, uncomplicated: Secondary | ICD-10-CM | POA: Insufficient documentation

## 2013-09-06 DIAGNOSIS — I10 Essential (primary) hypertension: Secondary | ICD-10-CM | POA: Insufficient documentation

## 2013-09-06 DIAGNOSIS — F329 Major depressive disorder, single episode, unspecified: Secondary | ICD-10-CM | POA: Insufficient documentation

## 2013-09-06 DIAGNOSIS — G8929 Other chronic pain: Secondary | ICD-10-CM | POA: Insufficient documentation

## 2013-09-06 DIAGNOSIS — M25562 Pain in left knee: Secondary | ICD-10-CM

## 2013-09-06 DIAGNOSIS — M25579 Pain in unspecified ankle and joints of unspecified foot: Secondary | ICD-10-CM | POA: Insufficient documentation

## 2013-09-06 DIAGNOSIS — J45909 Unspecified asthma, uncomplicated: Secondary | ICD-10-CM | POA: Insufficient documentation

## 2013-09-06 DIAGNOSIS — Z79899 Other long term (current) drug therapy: Secondary | ICD-10-CM | POA: Insufficient documentation

## 2013-09-06 DIAGNOSIS — Z792 Long term (current) use of antibiotics: Secondary | ICD-10-CM | POA: Insufficient documentation

## 2013-09-06 DIAGNOSIS — F411 Generalized anxiety disorder: Secondary | ICD-10-CM | POA: Insufficient documentation

## 2013-09-06 NOTE — ED Notes (Signed)
Knee pain and leg pain got bad this am no new injury pt is able to ambulate her huband checked in also

## 2013-09-06 NOTE — Discharge Instructions (Signed)
Take tylenol, 325-650 mg every 6-8 hours. Follow up with your primary care doctor.  Knee Pain The knee is the complex joint between your thigh and your lower leg. It is made up of bones, tendons, ligaments, and cartilage. The bones that make up the knee are:  The femur in the thigh.  The tibia and fibula in the lower leg.  The patella or kneecap riding in the groove on the lower femur. CAUSES  Knee pain is a common complaint with many causes. A few of these causes are:  Injury, such as:  A ruptured ligament or tendon injury.  Torn cartilage.  Medical conditions, such as:  Gout  Arthritis  Infections  Overuse, over training, or overdoing a physical activity. Knee pain can be minor or severe. Knee pain can accompany debilitating injury. Minor knee problems often respond well to self-care measures or get well on their own. More serious injuries may need medical intervention or even surgery. SYMPTOMS The knee is complex. Symptoms of knee problems can vary widely. Some of the problems are:  Pain with movement and weight bearing.  Swelling and tenderness.  Buckling of the knee.  Inability to straighten or extend your knee.  Your knee locks and you cannot straighten it.  Warmth and redness with pain and fever.  Deformity or dislocation of the kneecap. DIAGNOSIS  Determining what is wrong may be very straight forward such as when there is an injury. It can also be challenging because of the complexity of the knee. Tests to make a diagnosis may include:  Your caregiver taking a history and doing a physical exam.  Routine X-rays can be used to rule out other problems. X-rays will not reveal a cartilage tear. Some injuries of the knee can be diagnosed by:  Arthroscopy a surgical technique by which a small video camera is inserted through tiny incisions on the sides of the knee. This procedure is used to examine and repair internal knee joint problems. Tiny instruments can be  used during arthroscopy to repair the torn knee cartilage (meniscus).  Arthrography is a radiology technique. A contrast liquid is directly injected into the knee joint. Internal structures of the knee joint then become visible on X-ray film.  An MRI scan is a non X-ray radiology procedure in which magnetic fields and a computer produce two- or three-dimensional images of the inside of the knee. Cartilage tears are often visible using an MRI scanner. MRI scans have largely replaced arthrography in diagnosing cartilage tears of the knee.  Blood work.  Examination of the fluid that helps to lubricate the knee joint (synovial fluid). This is done by taking a sample out using a needle and a syringe. TREATMENT The treatment of knee problems depends on the cause. Some of these treatments are:  Depending on the injury, proper casting, splinting, surgery, or physical therapy care will be needed.  Give yourself adequate recovery time. Do not overuse your joints. If you begin to get sore during workout routines, back off. Slow down or do fewer repetitions.  For repetitive activities such as cycling or running, maintain your strength and nutrition.  Alternate muscle groups. For example, if you are a weight lifter, work the upper body on one day and the lower body the next.  Either tight or weak muscles do not give the proper support for your knee. Tight or weak muscles do not absorb the stress placed on the knee joint. Keep the muscles surrounding the knee strong.  Take care  of mechanical problems.  If you have flat feet, orthotics or special shoes may help. See your caregiver if you need help.  Arch supports, sometimes with wedges on the inner or outer aspect of the heel, can help. These can shift pressure away from the side of the knee most bothered by osteoarthritis.  A brace called an "unloader" brace also may be used to help ease the pressure on the most arthritic side of the knee.  If your  caregiver has prescribed crutches, braces, wraps or ice, use as directed. The acronym for this is PRICE. This means protection, rest, ice, compression, and elevation.  Nonsteroidal anti-inflammatory drugs (NSAIDs), can help relieve pain. But if taken immediately after an injury, they may actually increase swelling. Take NSAIDs with food in your stomach. Stop them if you develop stomach problems. Do not take these if you have a history of ulcers, stomach pain, or bleeding from the bowel. Do not take without your caregiver's approval if you have problems with fluid retention, heart failure, or kidney problems.  For ongoing knee problems, physical therapy may be helpful.  Glucosamine and chondroitin are over-the-counter dietary supplements. Both may help relieve the pain of osteoarthritis in the knee. These medicines are different from the usual anti-inflammatory drugs. Glucosamine may decrease the rate of cartilage destruction.  Injections of a corticosteroid drug into your knee joint may help reduce the symptoms of an arthritis flare-up. They may provide pain relief that lasts a few months. You may have to wait a few months between injections. The injections do have a small increased risk of infection, water retention, and elevated blood sugar levels.  Hyaluronic acid injected into damaged joints may ease pain and provide lubrication. These injections may work by reducing inflammation. A series of shots may give relief for as long as 6 months.  Topical painkillers. Applying certain ointments to your skin may help relieve the pain and stiffness of osteoarthritis. Ask your pharmacist for suggestions. Many over the-counter products are approved for temporary relief of arthritis pain.  In some countries, doctors often prescribe topical NSAIDs for relief of chronic conditions such as arthritis and tendinitis. A review of treatment with NSAID creams found that they worked as well as oral medications but  without the serious side effects. PREVENTION  Maintain a healthy weight. Extra pounds put more strain on your joints.  Get strong, stay limber. Weak muscles are a common cause of knee injuries. Stretching is important. Include flexibility exercises in your workouts.  Be smart about exercise. If you have osteoarthritis, chronic knee pain or recurring injuries, you may need to change the way you exercise. This does not mean you have to stop being active. If your knees ache after jogging or playing basketball, consider switching to swimming, water aerobics, or other low-impact activities, at least for a few days a week. Sometimes limiting high-impact activities will provide relief.  Make sure your shoes fit well. Choose footwear that is right for your sport.  Protect your knees. Use the proper gear for knee-sensitive activities. Use kneepads when playing volleyball or laying carpet. Buckle your seat belt every time you drive. Most shattered kneecaps occur in car accidents.  Rest when you are tired. SEEK MEDICAL CARE IF:  You have knee pain that is continual and does not seem to be getting better.  SEEK IMMEDIATE MEDICAL CARE IF:  Your knee joint feels hot to the touch and you have a high fever. MAKE SURE YOU:   Understand these instructions.  Will watch your condition.  Will get help right away if you are not doing well or get worse. Document Released: 10/27/2006 Document Revised: 03/24/2011 Document Reviewed: 10/27/2006 [facility] Patient Information 2015 [place], [place]. This information is not intended to replace advice given to you by your health care provider. Make sure you discuss any questions you have with your health care provider.

## 2013-09-06 NOTE — ED Provider Notes (Signed)
CSN: [id number]     Arrival date & time 09/06/13  1138 History   First MD Initiated Contact with Patient 09/06/13 1303     Chief Complaint  Patient presents with  . Knee Pain     (Consider location/radiation/quality/duration/timing/severity/associated sxs/prior Treatment) HPI Comments: Patient is a 48 year old female with a past medical history of chronic pain, depression, arthritis, asthma, hypertension and anxiety who presents to the emergency department complaining of bilateral knee and ankle pain x3 years, worsening this morning. Patient reports she broke her right ankle and hurt her left knee in 2012 and has been having issues since. She reports since she compensates with her opposite knee and ankle she believes she has caused further problems. No new injury or trauma. States when she stepped out of bed this morning her knees felt "all jacked up" and she needed to take a few steps before she could walk normally. No aggravating or alleviating factors. States she wears a brace on her right ankle.  Denies fever or chills. No back pain.  Patient is a 48 y.o. female presenting with knee pain. The history is provided by the patient.  Knee Pain   Past Medical History  Diagnosis Date  . Obesity   . Chronic pain   . Depression   . Arthritis   . Asthma   . Hypertension   . Anxiety    Past Surgical History  Procedure Laterality Date  . Appendectomy    . Tubal ligation  1995   Family History  Problem Relation Age of Onset  . Heart disease Mother   . Early death Mother   . Asthma Mother   . Diabetes Mother   . Hypertension Mother   . Cancer Father   . Alcohol abuse Father   . Early death Father   . Hypertension Father   . Asthma Sister   . Cancer Sister   . Hypertension Sister   . Depression Brother   . Drug abuse Brother   . Hypertension Brother    History  Substance Use Topics  . Smoking status: Current Every Day Smoker -- 0.50 packs/day    Types: Cigarettes  .  Smokeless tobacco: Not on file  . Alcohol Use: No   OB History   Grav Para Term Preterm Abortions TAB SAB Ect Mult Living                 Review of Systems  Musculoskeletal:       + bilateral knee and ankle pain.  All other systems reviewed and are negative.     Allergies  Review of patient's allergies indicates no known allergies.  Home Medications   Prior to Admission medications   Medication Sig Start Date End Date Taking? Authorizing Provider  albuterol (PROVENTIL HFA;VENTOLIN HFA) 108 (90 BASE) MCG/ACT inhaler Inhale 2 puffs into the lungs every 6 (six) hours as needed. For shortness of breath 12/08/12   Tresa Garter, MD  buPROPion (WELLBUTRIN XL) 300 MG 24 hr tablet Take 1 tablet (300 mg total) by mouth daily. 07/25/13   Benjamine Mola, FNP  busPIRone (BUSPAR) 5 MG tablet Take 1 tablet (5 mg total) by mouth 2 (two) times daily. 07/25/13   Benjamine Mola, FNP  ciprofloxacin (CIPRO) 250 MG tablet Take 1 tablet (250 mg total) by mouth 2 (two) times daily. 07/25/13   Benjamine Mola, FNP  hydrochlorothiazide (HYDRODIURIL) 25 MG tablet Take 1 tablet (25 mg total) by mouth daily. 08/09/13   Deepak  Advani, MD  ibuprofen (ADVIL,MOTRIN) 600 MG tablet Take 1 tablet (600 mg total) by mouth every 8 (eight) hours as needed. 08/09/13   Lorayne Marek, MD  traZODone (DESYREL) 100 MG tablet Take 1 tablet (100 mg total) by mouth at bedtime. 08/09/13   Lorayne Marek, MD   BP 147/74  Pulse 57  Temp(Src) 98 F (36.7 C)  Resp 16  Wt 165 lb (74.844 kg)  SpO2 100% Physical Exam  Nursing note and vitals reviewed. Constitutional: She is oriented to person, place, and time. She appears well-developed and well-nourished. No distress.  HENT:  Head: Normocephalic and atraumatic.  Mouth/Throat: Oropharynx is clear and moist.  Eyes: Conjunctivae are normal.  Neck: Normal range of motion. Neck supple.  Cardiovascular: Normal rate, regular rhythm and normal heart sounds.   Pulmonary/Chest: Effort  normal and breath sounds normal.  Musculoskeletal: Normal range of motion. She exhibits no edema.  FROM bilateral ankles and knees with crepitus noted in bilateral knees and left ankle. No edema. Tenderness to medial aspect of left knee without swelling or deformity. Ligamentous structures intact. No joint laxity. No skin color change.  Neurological: She is alert and oriented to person, place, and time.  Skin: Skin is warm and dry. She is not diaphoretic.  Psychiatric: She has a normal mood and affect. Her behavior is normal.    ED Course  Procedures (including critical care time) Labs Review Labs Reviewed - No data to display  Imaging Review No results found.   EKG Interpretation None      MDM   Final diagnoses:  Arthralgia of both knees   Patient with chronic knee and ankle pain, she is well-appearing and in no apparent distress. Afebrile, vital signs stable. Full range of motion bilateral knees and ankles, crepitus noted in knees. Symptoms most likely due to arthritis. I advised her to take Tylenol, apply ice and followup with her PCP. I do not feel imaging studies are necessary at this time and she is afebrile, no swelling, no injury or trauma. Stable for discharge. Return precautions given. Patient states understanding of treatment care plan and is agreeable.  Illene Labrador, PA-C 09/06/13 1336

## 2013-09-07 NOTE — ED Provider Notes (Signed)
Medical screening examination/treatment/procedure(s) were performed by non-physician practitioner and as supervising physician I was immediately available for consultation/collaboration.   EKG Interpretation None        Ephraim Hamburger, MD 09/07/13 [phone]

## 2013-09-13 ENCOUNTER — Ambulatory Visit (INDEPENDENT_AMBULATORY_CARE_PROVIDER_SITE_OTHER): Payer: Self-pay | Admitting: Sports Medicine

## 2013-09-13 ENCOUNTER — Encounter: Payer: Self-pay | Admitting: Sports Medicine

## 2013-09-13 VITALS — BP 130/60 | Ht 63.0 in | Wt 165.0 lb

## 2013-09-13 DIAGNOSIS — G8929 Other chronic pain: Secondary | ICD-10-CM | POA: Insufficient documentation

## 2013-09-13 DIAGNOSIS — M2242 Chondromalacia patellae, left knee: Secondary | ICD-10-CM

## 2013-09-13 DIAGNOSIS — M25562 Pain in left knee: Secondary | ICD-10-CM

## 2013-09-13 DIAGNOSIS — M2240 Chondromalacia patellae, unspecified knee: Secondary | ICD-10-CM

## 2013-09-13 MED ORDER — METHYLPREDNISOLONE ACETATE 80 MG/ML IJ SUSP
80.0000 mg | Freq: Once | INTRAMUSCULAR | Status: AC
Start: 2013-09-13 — End: 2013-09-13
  Administered 2013-09-13: 80 mg via INTRA_ARTICULAR

## 2013-09-13 MED ORDER — DICLOFENAC SODIUM 75 MG PO TBEC: 75.0000 mg | DELAYED_RELEASE_TABLET | Freq: Three times a day (TID) | ORAL | Status: DC

## 2013-09-13 NOTE — Assessment & Plan Note (Signed)
-  Patient tolerated the injection well with improvement of symptoms. She is given post injection precautions. -We provided her with an Ace wrap for compression. She will consider purchasing an over-the-counter knee sleeve as well. - We discussed resting, elevating, and icing the knee as needed. -She should work on quadriceps strengthening and hip abductor strengthening, and we provided exercises for her today. -In reviewing the prior x-rays, we did not feel that repeating these x-rays today would reveal any significant change compared to previous, and the patient elected not to repeat these. -If her knee pain persists, we may consider obtaining updated standing bilateral knee x-rays. -I provided her with a refill of her Voltaren as well. -She'll followup if needed.

## 2013-09-13 NOTE — Progress Notes (Signed)
Subjective:    Patient ID: Brittany Jimenez, female    DOB: May 15, 1965, 48 y.o.   MRN: [id number]  HPI Brittany Jimenez is a 48 year old female who presents for followup of chronic left knee pain. She was recently seen in the emergency room for increasing left knee pain and swelling following an incident where she says that her knee may have given way. She notes chronic popping that is painful in the left knee. She denies any sensation of instability. Location of knee pain is primarily anterior inferior patella with some medial joint line tenderness. She has tried Voltaren twice daily and physical therapy approximately one year ago with mild relief of symptoms. She has not tried any compression therapy or home exercises. The corticosteroid helped for about one week she says. She denies any associated low back pain or lower extremity weakness. She denies any fevers, chills, redness of her knee swells, or history of gout. X-rays were performed 08/02/12 of the bilateral standing knees which revealed early DJD, but overall preserved joint space. She says that the pain will frequently wake her at night. She is overall somewhat active with walking, but does not formally exercise.  Past Medical History  Diagnosis Date  . Obesity   . Chronic pain   . Depression   . Arthritis   . Asthma   . Hypertension   . Anxiety    History   Social History  . Marital Status: Married    Spouse Name: N/A    Number of Children: N/A  . Years of Education: N/A   Occupational History  . Not on file.   Social History Main Topics  . Smoking status: Current Every Day Smoker -- 0.50 packs/day    Types: Cigarettes  . Smokeless tobacco: Not on file  . Alcohol Use: No  . Drug Use: No  . Sexual Activity: Yes    Birth Control/ Protection: Surgical   Other Topics Concern  . Not on file   Social History Narrative   Lives in [place] with husband, nephew, and one daughter.  Applying for disability currently.   Used to work as a Educational psychologist at [employer].    Review of Systems  Review of systems noted as per history of present illness, otherwise 7 point review of systems was performed is otherwise negative.    Objective:   Physical Exam BP 130/60  Ht 5\' 3"  (1.6 m)  Wt 165 lb (74.844 kg)  BMI 29.24 kg/m2 GEN: The patient is well-developed well-nourished female and in no acute distress.  She is awake alert and oriented x3. SKIN: warm and well-perfused, no rash  EXTR: No lower extremity edema or calf tenderness Neuro: Strength 5/5 globally. Sensation intact throughout. DTRs 2/4 bilaterally. No focal deficits. Vasc: +2 bilateral distal pulses. No edema.  MSK:  Examination of the left knee reveals no obvious swelling or deformity. There is tenderness to palpation over the anterior peripatellar with positive patellar grind test. Palpable popping is felt with active knee flexion and extension over the patella. The left quadriceps is not atrophied, though weak compared to the right. No ligamentous laxity with valgus or varus testing. Negative Lachman's. No overlying erythema or induration.  Procedure:  Risks, benefits and complications were reviewed with the patient. After obtaining informed verbal consent the left knee was sterilely prepped with alcohol.  Ethyl chloride was used to anesthetize the skin and intra-articular injection using a 25-gauge by 1-1/2 inch needle was accomplished without difficulty.  We injected 80mg  methylprednisolone  and 4 cc 1% lidocaine without epinephrine.  Patient tolerated the procedure well and was observed for 5-10 min. Post injection instructions, including signs and symptoms of complications were discussed.  08/02/12 Standing X-rays: Early DJD. Well-preserved joint space however. No fractures or obvious deformity.  Limited musculoskeletal ultrasound: Views of the left knee were obtained in the long and short axis. The quadriceps and patellar tendons appear to be normal caliber  and density. There insertion sites appear normal. There is no effusion seen. The medial meniscus and lateral meniscus appear normal.    Assessment & Plan:  1. left knee patellofemoral chondromalacia  -Patient tolerated the injection well with improvement of symptoms. She is given post injection precautions. -We provided her with an Ace wrap for compression. She will consider purchasing an over-the-counter knee sleeve as well. - We discussed resting, elevating, and icing the knee as needed. -She should work on quadriceps strengthening and hip abductor strengthening, and we provided exercises for her today. -In reviewing the prior x-rays, we did not feel that repeating these x-rays today would reveal any significant change compared to previous, and the patient elected not to repeat these. -If her knee pain persists, we may consider obtaining updated standing bilateral knee x-rays. -I provided her with a refill of her Voltaren as well. -She'll followup if needed.

## 2013-09-15 ENCOUNTER — Ambulatory Visit: Payer: Self-pay | Admitting: Internal Medicine

## 2013-10-05 ENCOUNTER — Ambulatory Visit: Payer: Self-pay | Admitting: Obstetrics and Gynecology

## 2013-11-01 ENCOUNTER — Encounter: Payer: Self-pay | Admitting: Internal Medicine

## 2013-11-01 ENCOUNTER — Ambulatory Visit: Payer: Self-pay | Attending: Internal Medicine | Admitting: Internal Medicine

## 2013-11-01 VITALS — BP 138/85 | HR 62 | Temp 98.2°F | Resp 16 | Wt 167.8 lb

## 2013-11-01 DIAGNOSIS — G8929 Other chronic pain: Secondary | ICD-10-CM | POA: Insufficient documentation

## 2013-11-01 DIAGNOSIS — F17210 Nicotine dependence, cigarettes, uncomplicated: Secondary | ICD-10-CM | POA: Insufficient documentation

## 2013-11-01 DIAGNOSIS — F419 Anxiety disorder, unspecified: Secondary | ICD-10-CM | POA: Insufficient documentation

## 2013-11-01 DIAGNOSIS — Z8739 Personal history of other diseases of the musculoskeletal system and connective tissue: Secondary | ICD-10-CM | POA: Insufficient documentation

## 2013-11-01 DIAGNOSIS — M25562 Pain in left knee: Secondary | ICD-10-CM | POA: Insufficient documentation

## 2013-11-01 DIAGNOSIS — M1990 Unspecified osteoarthritis, unspecified site: Secondary | ICD-10-CM | POA: Insufficient documentation

## 2013-11-01 DIAGNOSIS — Z2821 Immunization not carried out because of patient refusal: Secondary | ICD-10-CM | POA: Insufficient documentation

## 2013-11-01 DIAGNOSIS — F329 Major depressive disorder, single episode, unspecified: Secondary | ICD-10-CM | POA: Insufficient documentation

## 2013-11-01 DIAGNOSIS — Z8639 Personal history of other endocrine, nutritional and metabolic disease: Secondary | ICD-10-CM | POA: Insufficient documentation

## 2013-11-01 DIAGNOSIS — J45909 Unspecified asthma, uncomplicated: Secondary | ICD-10-CM | POA: Insufficient documentation

## 2013-11-01 DIAGNOSIS — I10 Essential (primary) hypertension: Secondary | ICD-10-CM | POA: Insufficient documentation

## 2013-11-01 MED ORDER — HYDROCHLOROTHIAZIDE 25 MG PO TABS: 25.0000 mg | ORAL_TABLET | Freq: Every day | ORAL | Status: DC

## 2013-11-01 NOTE — Patient Instructions (Signed)
DASH Eating Plan °DASH stands for "Dietary Approaches to Stop Hypertension." The DASH eating plan is a healthy eating plan that has been shown to reduce high blood pressure (hypertension). Additional health benefits may include reducing the risk of type 2 diabetes mellitus, heart disease, and stroke. The DASH eating plan may also help with weight loss. °WHAT DO I NEED TO KNOW ABOUT THE DASH EATING PLAN? °For the DASH eating plan, you will follow these general guidelines: °· Choose foods with a percent daily value for sodium of less than 5% (as listed on the food label). °· Use salt-free seasonings or herbs instead of table salt or sea salt. °· Check with your health care provider or pharmacist before using salt substitutes. °· Eat lower-sodium products, often labeled as "lower sodium" or "no salt added." °· Eat fresh foods. °· Eat more vegetables, fruits, and low-fat dairy products. °· Choose whole grains. Look for the word "whole" as the first word in the ingredient list. °· Choose fish and skinless chicken or turkey more often than red meat. Limit fish, poultry, and meat to 6 oz (170 g) each day. °· Limit sweets, desserts, sugars, and sugary drinks. °· Choose heart-healthy fats. °· Limit cheese to 1 oz (28 g) per day. °· Eat more home-cooked food and less restaurant, buffet, and fast food. °· Limit fried foods. °· Cook foods using methods other than frying. °· Limit canned vegetables. If you do use them, rinse them well to decrease the sodium. °· When eating at a restaurant, ask that your food be prepared with less salt, or no salt if possible. °WHAT FOODS CAN I EAT? °Seek help from a dietitian for individual calorie needs. °Grains °Whole grain or whole wheat bread. Brown rice. Whole grain or whole wheat pasta. Quinoa, bulgur, and whole grain cereals. Low-sodium cereals. Corn or whole wheat flour tortillas. Whole grain cornbread. Whole grain crackers. Low-sodium crackers. °Vegetables °Fresh or frozen vegetables  (raw, steamed, roasted, or grilled). Low-sodium or reduced-sodium tomato and vegetable juices. Low-sodium or reduced-sodium tomato sauce and paste. Low-sodium or reduced-sodium canned vegetables.  °Fruits °All fresh, canned (in natural juice), or frozen fruits. °Meat and Other Protein Products °Ground beef (85% or leaner), grass-fed beef, or beef trimmed of fat. Skinless chicken or turkey. Ground chicken or turkey. Pork trimmed of fat. All fish and seafood. Eggs. Dried beans, peas, or lentils. Unsalted nuts and seeds. Unsalted canned beans. °Dairy °Low-fat dairy products, such as skim or 1% milk, 2% or reduced-fat cheeses, low-fat ricotta or cottage cheese, or plain low-fat yogurt. Low-sodium or reduced-sodium cheeses. °Fats and Oils °Tub margarines without trans fats. Light or reduced-fat mayonnaise and salad dressings (reduced sodium). Avocado. Safflower, olive, or canola oils. Natural peanut or almond butter. °Other °Unsalted popcorn and pretzels. °The items listed above may not be a complete list of recommended foods or beverages. Contact your dietitian for more options. °WHAT FOODS ARE NOT RECOMMENDED? °Grains °White bread. White pasta. White rice. Refined cornbread. Bagels and croissants. Crackers that contain trans fat. °Vegetables °Creamed or fried vegetables. Vegetables in a cheese sauce. Regular canned vegetables. Regular canned tomato sauce and paste. Regular tomato and vegetable juices. °Fruits °Dried fruits. Canned fruit in light or heavy syrup. Fruit juice. °Meat and Other Protein Products °Fatty cuts of meat. Ribs, chicken wings, bacon, sausage, bologna, salami, chitterlings, fatback, hot dogs, bratwurst, and packaged luncheon meats. Salted nuts and seeds. Canned beans with salt. °Dairy °Whole or 2% milk, cream, half-and-half, and cream cheese. Whole-fat or sweetened yogurt. Full-fat   cheeses or blue cheese. Nondairy creamers and whipped toppings. Processed cheese, cheese spreads, or cheese  curds. °Condiments °Onion and garlic salt, seasoned salt, table salt, and sea salt. Canned and packaged gravies. Worcestershire sauce. Tartar sauce. Barbecue sauce. Teriyaki sauce. Soy sauce, including reduced sodium. Steak sauce. Fish sauce. Oyster sauce. Cocktail sauce. Horseradish. Ketchup and mustard. Meat flavorings and tenderizers. Bouillon cubes. Hot sauce. Tabasco sauce. Marinades. Taco seasonings. Relishes. °Fats and Oils °Butter, stick margarine, lard, shortening, ghee, and bacon fat. Coconut, palm kernel, or palm oils. Regular salad dressings. °Other °Pickles and olives. Salted popcorn and pretzels. °The items listed above may not be a complete list of foods and beverages to avoid. Contact your dietitian for more information. °WHERE CAN I FIND MORE INFORMATION? °National Heart, Lung, and Blood Institute: www.nhlbi.nih.gov/health/health-topics/topics/dash/ °Document Released: 12/19/2010 Document Revised: 05/16/2013 Document Reviewed: 11/03/2012 °Exit[employer]® Patient Information ©2015 [employer]. This information is not intended to replace advice given to you by your health care provider. Make sure you discuss any questions you have with your health care provider. ° °

## 2013-11-01 NOTE — Progress Notes (Signed)
MRN: [id number] Name: Brittany Jimenez  Sex: female Age: 48 y.o. DOB: 1965/05/21  Allergies: Review of patient's allergies indicates no known allergies.  Chief Complaint  Patient presents with  . Knee Pain    HPI: Patient is 48 y.o. female who  has history of hypertension, chronic left knee pain, she was seen by sports medicine physicians last month and was prescribed Voltaren which as per patient was unaware of the  prescription and has not started taking this medication has a still persistent pain denies any recent fall or trauma, patient also reported to have history of gout. Currently denies any fever chills chest and shortness of breath, she has been compliant in taking her blood pressure medication, she is requesting refill on her meds.  Past Medical History  Diagnosis Date  . Obesity   . Chronic pain   . Depression   . Arthritis   . Asthma   . Hypertension   . Anxiety     Past Surgical History  Procedure Laterality Date  . Appendectomy    . Tubal ligation  1995      Medication List       This list is accurate as of: 11/01/13  3:48 PM.  Always use your most recent med list.               albuterol 108 (90 BASE) MCG/ACT inhaler  Commonly known as:  PROVENTIL HFA;VENTOLIN HFA  Inhale 2 puffs into the lungs every 6 (six) hours as needed. For shortness of breath     buPROPion 300 MG 24 hr tablet  Commonly known as:  WELLBUTRIN XL  Take 1 tablet (300 mg total) by mouth daily.     busPIRone 5 MG tablet  Commonly known as:  BUSPAR  Take 1 tablet (5 mg total) by mouth 2 (two) times daily.     ciprofloxacin 250 MG tablet  Commonly known as:  CIPRO  Take 1 tablet (250 mg total) by mouth 2 (two) times daily.     diclofenac 75 MG EC tablet  Commonly known as:  VOLTAREN  Take 1 tablet (75 mg total) by mouth 3 (three) times daily.     hydrochlorothiazide 25 MG tablet  Commonly known as:  HYDRODIURIL  Take 1 tablet (25 mg total) by mouth daily.     ibuprofen 600 MG tablet  Commonly known as:  ADVIL,MOTRIN  Take 1 tablet (600 mg total) by mouth every 8 (eight) hours as needed.     traZODone 100 MG tablet  Commonly known as:  DESYREL  Take 1 tablet (100 mg total) by mouth at bedtime.        Meds ordered this encounter  Medications  . hydrochlorothiazide (HYDRODIURIL) 25 MG tablet    Sig: Take 1 tablet (25 mg total) by mouth daily.    Dispense:  30 tablet    Refill:  3     There is no immunization history on file for this patient.  Family History  Problem Relation Age of Onset  . Heart disease Mother   . Early death Mother   . Asthma Mother   . Diabetes Mother   . Hypertension Mother   . Cancer Father   . Alcohol abuse Father   . Early death Father   . Hypertension Father   . Asthma Sister   . Cancer Sister   . Hypertension Sister   . Depression Brother   . Drug abuse Brother   . Hypertension Brother  History  Substance Use Topics  . Smoking status: Current Every Day Smoker -- 0.50 packs/day    Types: Cigarettes  . Smokeless tobacco: Not on file  . Alcohol Use: No    Review of Systems   As noted in HPI  Filed Vitals:   11/01/13 1503  BP: 138/85  Pulse: 62  Temp: 98.2 F (36.8 C)  Resp: 16    Physical Exam  Physical Exam  Constitutional: No distress.  Eyes: EOM are normal. Pupils are equal, round, and reactive to light.  Cardiovascular: Normal rate and regular rhythm.   Pulmonary/Chest: Breath sounds normal. No respiratory distress. She has no wheezes. She has no rales.  Musculoskeletal:  Left knee minimal tenderness and swelling     CBC    Component Value Date/Time   WBC 11.9* 07/22/2013 1954   RBC 4.94 07/22/2013 1954   HGB 13.5 07/22/2013 1954   HGB 14.4 06/29/2012 1355   HCT 40.4 07/22/2013 1954   PLT 224 07/22/2013 1954   MCV 81.8 07/22/2013 1954   LYMPHSABS 2.4 05/22/2011 1730   MONOABS 0.6 05/22/2011 1730   EOSABS 0.5 05/22/2011 1730   BASOSABS 0.0 05/22/2011 1730    CMP       Component Value Date/Time   NA 140 07/22/2013 1954   K 3.8 07/22/2013 1954   CL 101 07/22/2013 1954   CO2 28 07/22/2013 1954   GLUCOSE 96 07/22/2013 1954   BUN 16 07/22/2013 1954   CREATININE 0.94 07/22/2013 1954   CREATININE 0.83 12/03/2012 1459   CALCIUM 10.0 07/22/2013 1954   PROT 8.0 07/22/2013 1954   ALBUMIN 3.8 07/22/2013 1954   AST 17 07/22/2013 1954   ALT 12 07/22/2013 1954   ALKPHOS 74 07/22/2013 1954   BILITOT <0.2* 07/22/2013 1954   GFRNONAA 70* 07/22/2013 1954   GFRNONAA 83 12/03/2012 1459   GFRAA 81* 07/22/2013 1954   GFRAA >89 12/03/2012 1459    No results found for this basename: chol, tri, ldl    No components found with this basename: hga1c    Lab Results  Component Value Date/Time   AST 17 07/22/2013  7:54 PM    Assessment and Plan  Hypertension, essential, benign - Plan: Will check blood chemistry COMPLETE METABOLIC PANEL WITH GFR, hydrochlorothiazide (HYDRODIURIL) 25 MG tablet  Left knee pain Patient will pick up the prescription for Voltaren  in which was prescribed by her sports medicine Dr. History of gout - Plan: Uric Acid   Health Maintenance Patient declines flu shot.  Return in about 3 months (around 02/01/2014) for hypertension.  Lorayne Marek, MD

## 2013-11-01 NOTE — Progress Notes (Signed)
Patient complains of gout to her left ankle Left knee swelling "knot" to lower left leg

## 2013-11-02 LAB — COMPLETE METABOLIC PANEL WITH GFR
ALT: 10 U/L (ref 0–35)
AST: 15 U/L (ref 0–37)
Albumin: 4.2 g/dL (ref 3.5–5.2)
Alkaline Phosphatase: 57 U/L (ref 39–117)
BUN: 11 mg/dL (ref 6–23)
CO2: 24 mEq/L (ref 19–32)
Calcium: 9 mg/dL (ref 8.4–10.5)
Chloride: 107 mEq/L (ref 96–112)
Creat: 0.74 mg/dL (ref 0.50–1.10)
GFR, Est African American: >89 mL/min
GFR, Est Non African American: >89 mL/min
Glucose, Bld: 73 mg/dL (ref 70–99)
Potassium: 3.7 mEq/L (ref 3.5–5.3)
Sodium: 138 mEq/L (ref 135–145)
Total Bilirubin: 0.4 mg/dL (ref 0.2–1.2)
Total Protein: 6.6 g/dL (ref 6.0–8.3)

## 2013-11-02 LAB — URIC ACID: Uric Acid, Serum: 3.6 mg/dL (ref 2.4–7.0)

## 2013-11-07 ENCOUNTER — Telehealth: Payer: Self-pay | Admitting: Emergency Medicine

## 2013-11-07 NOTE — Telephone Encounter (Signed)
Pt given normal blood work 

## 2013-11-07 NOTE — Telephone Encounter (Signed)
Message copied by Ricci Barker on Mon Nov 07, 2013  4:09 PM ------      Message from: Lorayne Marek      Created: Wed Nov 02, 2013  1:14 PM       Call and let the Patient know that blood work is normal.       ------

## 2013-11-08 ENCOUNTER — Telehealth: Payer: Self-pay | Admitting: *Deleted

## 2013-11-08 NOTE — Telephone Encounter (Signed)
Message copied by Johny Shears on Tue Nov 08, 2013  4:06 PM ------      Message from: Lorayne Marek      Created: Wed Nov 02, 2013  1:14 PM       Call and let the Patient know that blood work is normal.       ------

## 2013-11-08 NOTE — Telephone Encounter (Signed)
Spoke with patient and informed her of normal labs

## 2013-11-09 ENCOUNTER — Telehealth: Payer: Self-pay | Admitting: General Practice

## 2013-11-09 ENCOUNTER — Ambulatory Visit: Payer: Self-pay | Admitting: Obstetrics & Gynecology

## 2013-11-09 NOTE — Telephone Encounter (Signed)
Patient no showed for appt today. Called patient and she states she tried to call but we weren't open yet and that she was trying to get the appt rescheduled. Told patient I will let the front office staff know and they will contact her in about a week with a new appt. Patient verbalized understanding and had no other questions

## 2013-11-10 ENCOUNTER — Encounter: Payer: Self-pay | Admitting: Obstetrics & Gynecology

## 2013-12-29 ENCOUNTER — Telehealth: Payer: Self-pay | Admitting: General Practice

## 2013-12-29 ENCOUNTER — Encounter: Payer: Self-pay | Admitting: General Practice

## 2013-12-29 ENCOUNTER — Ambulatory Visit: Payer: Self-pay | Admitting: Obstetrics & Gynecology

## 2013-12-29 NOTE — Telephone Encounter (Signed)
Patient no showed for appt today. Called patient, no answer- left message that we are trying to contact you because it appears you missed an appt in our office today, please call our front office staff to reschedule this appt. Will send letter

## 2014-01-20 ENCOUNTER — Emergency Department (HOSPITAL_COMMUNITY)
Admission: EM | Admit: 2014-01-20 | Discharge: 2014-01-21 | Disposition: A | Discharge status: Home / Self Care | Payer: Self-pay | Attending: Emergency Medicine | Admitting: Emergency Medicine

## 2014-01-20 ENCOUNTER — Encounter (HOSPITAL_COMMUNITY): Payer: Self-pay | Admitting: Emergency Medicine

## 2014-01-20 ENCOUNTER — Emergency Department (HOSPITAL_COMMUNITY): Payer: Self-pay

## 2014-01-20 DIAGNOSIS — F329 Major depressive disorder, single episode, unspecified: Secondary | ICD-10-CM | POA: Insufficient documentation

## 2014-01-20 DIAGNOSIS — F419 Anxiety disorder, unspecified: Secondary | ICD-10-CM | POA: Insufficient documentation

## 2014-01-20 DIAGNOSIS — Z79899 Other long term (current) drug therapy: Secondary | ICD-10-CM | POA: Insufficient documentation

## 2014-01-20 DIAGNOSIS — E669 Obesity, unspecified: Secondary | ICD-10-CM | POA: Insufficient documentation

## 2014-01-20 DIAGNOSIS — G8929 Other chronic pain: Secondary | ICD-10-CM | POA: Insufficient documentation

## 2014-01-20 DIAGNOSIS — Z3202 Encounter for pregnancy test, result negative: Secondary | ICD-10-CM | POA: Insufficient documentation

## 2014-01-20 DIAGNOSIS — M545 Low back pain, unspecified: Secondary | ICD-10-CM

## 2014-01-20 DIAGNOSIS — R350 Frequency of micturition: Secondary | ICD-10-CM | POA: Insufficient documentation

## 2014-01-20 DIAGNOSIS — Z720 Tobacco use: Secondary | ICD-10-CM | POA: Insufficient documentation

## 2014-01-20 DIAGNOSIS — M25521 Pain in right elbow: Secondary | ICD-10-CM | POA: Insufficient documentation

## 2014-01-20 DIAGNOSIS — J45909 Unspecified asthma, uncomplicated: Secondary | ICD-10-CM | POA: Insufficient documentation

## 2014-01-20 DIAGNOSIS — I10 Essential (primary) hypertension: Secondary | ICD-10-CM | POA: Insufficient documentation

## 2014-01-20 DIAGNOSIS — M1990 Unspecified osteoarthritis, unspecified site: Secondary | ICD-10-CM | POA: Insufficient documentation

## 2014-01-20 LAB — URINALYSIS, ROUTINE W REFLEX MICROSCOPIC
Bilirubin Urine: NEGATIVE
Glucose, UA: NEGATIVE mg/dL
HGB URINE DIPSTICK: NEGATIVE
KETONES UR: NEGATIVE mg/dL
Nitrite: NEGATIVE
PH: 7.5 (ref 5.0–8.0)
Protein, ur: NEGATIVE mg/dL
SPECIFIC GRAVITY, URINE: 1.022 (ref 1.005–1.030)
UROBILINOGEN UA: 1 mg/dL (ref 0.0–1.0)

## 2014-01-20 LAB — URINE MICROSCOPIC-ADD ON

## 2014-01-20 LAB — POC URINE PREG, ED: Preg Test, Ur: NEGATIVE

## 2014-01-20 MED ORDER — KETOROLAC TROMETHAMINE 30 MG/ML IJ SOLN
30.0000 mg | Freq: Once | INTRAMUSCULAR | Status: AC
  Administered 2014-01-20: 30 mg via INTRAMUSCULAR
  Filled 2014-01-20: qty 1

## 2014-01-20 MED ORDER — NAPROXEN 500 MG PO TABS: 500.0000 mg | ORAL_TABLET | Freq: Two times a day (BID) | ORAL | Status: DC

## 2014-01-20 MED ORDER — METHOCARBAMOL 500 MG PO TABS: 500.0000 mg | ORAL_TABLET | Freq: Two times a day (BID) | ORAL | Status: DC

## 2014-01-20 MED ORDER — POLYETHYLENE GLYCOL 3350 17 G PO PACK
17.0000 g | PACK | Freq: Every day | ORAL | Status: DC
Start: 2014-01-21 — End: 2014-01-20

## 2014-01-20 MED ORDER — FLEET ENEMA 7-19 GM/118ML RE ENEM: 1.0000 | ENEMA | Freq: Once | RECTAL | Status: DC

## 2014-01-20 MED ORDER — DIAZEPAM 5 MG PO TABS
5.0000 mg | ORAL_TABLET | Freq: Once | ORAL | Status: AC
  Administered 2014-01-20: 5 mg via ORAL
  Filled 2014-01-20: qty 1

## 2014-01-20 NOTE — Discharge Instructions (Signed)
Back Pain, Adult °Low back pain is very common. About 1 in 5 people have back pain. The cause of low back pain is rarely dangerous. The pain often gets better over time. About half of people with a sudden onset of back pain feel better in just 2 weeks. About 8 in 10 people feel better by 6 weeks.  °CAUSES °Some common causes of back pain include: °· Strain of the muscles or ligaments supporting the spine. °· Wear and tear (degeneration) of the spinal discs. °· Arthritis. °· Direct injury to the back. °DIAGNOSIS °Most of the time, the direct cause of low back pain is not known. However, back pain can be treated effectively even when the exact cause of the pain is unknown. Answering your caregiver's questions about your overall health and symptoms is one of the most accurate ways to make sure the cause of your pain is not dangerous. If your caregiver needs more information, he or she may order lab work or imaging tests (X-rays or MRIs). However, even if imaging tests show changes in your back, this usually does not require surgery. °HOME CARE INSTRUCTIONS °For many people, back pain returns. Since low back pain is rarely dangerous, it is often a condition that people can learn to manage on their own.  °· Remain active. It is stressful on the back to sit or stand in one place. Do not sit, drive, or stand in one place for more than 30 minutes at a time. Take short walks on level surfaces as soon as pain allows. Try to increase the length of time you walk each day. °· Do not stay in bed. Resting more than 1 or 2 days can delay your recovery. °· Do not avoid exercise or work. Your body is made to move. It is not dangerous to be active, even though your back may hurt. Your back will likely heal faster if you return to being active before your pain is gone. °· Pay attention to your body when you  bend and lift. Many people have less discomfort when lifting if they bend their knees, keep the load close to their bodies, and  avoid twisting. Often, the most comfortable positions are those that put less stress on your recovering back. °· Find a comfortable position to sleep. Use a firm mattress and lie on your side with your knees slightly bent. If you lie on your back, put a pillow under your knees. °· Only take over-the-counter or prescription medicines as directed by your caregiver. Over-the-counter medicines to reduce pain and inflammation are often the most helpful. Your caregiver may prescribe muscle relaxant drugs. These medicines help dull your pain so you can more quickly return to your normal activities and healthy exercise. °· Put ice on the injured area. °¨ Put ice in a plastic bag. °¨ Place a towel between your skin and the bag. °¨ Leave the ice on for 15-20 minutes, 03-04 times a day for the first 2 to 3 days. After that, ice and heat may be alternated to reduce pain and spasms. °· Ask your caregiver about trying back exercises and gentle massage. This may be of some benefit. °· Avoid feeling anxious or stressed. Stress increases muscle tension and can worsen back pain. It is important to recognize when you are anxious or stressed and learn ways to manage it. Exercise is a great option. °SEEK MEDICAL CARE IF: °· You have pain that is not relieved with rest or medicine. °· You have pain that does not improve in 1 week. °· You have new symptoms. °· You are generally not feeling well. °SEEK   IMMEDIATE MEDICAL CARE IF:   You have pain that radiates from your back into your legs.  You develop new bowel or bladder control problems.  You have unusual weakness or numbness in your arms or legs.  You develop nausea or vomiting.  You develop abdominal pain.  You feel faint. Document Released: 12/30/2004 Document Revised: 07/01/2011 Document Reviewed: 05/03/2013 [facility] Patient Information 2015 [place], [place]. This information is not intended to replace advice given to you by your health care provider. Make sure you  discuss any questions you have with your health care provider.  Urinary Frequency The number of times a normal person urinates depends upon how much liquid they take in and how much liquid they are losing. If the temperature is hot and there is high humidity, then the person will sweat more and usually breathe a little more frequently. These factors decrease the amount of frequency of urination that would be considered normal. The amount you drink is easily determined, but the amount of fluid lost is sometimes more difficult to calculate.  Fluid is lost in two ways:  Sensible fluid loss is usually measured by the amount of urine that you get rid of. Losses of fluid can also occur with diarrhea.  Insensible fluid loss is more difficult to measure. It is caused by evaporation. Insensible loss of fluid occurs through breathing and sweating. It usually ranges from a little less than a quart to a little more than a quart of fluid a day. In normal temperatures and activity levels, the average person may urinate 4 to 7 times in a 24-hour period. Needing to urinate more often than that could indicate a problem. If one urinates 4 to 7 times in 24 hours and has large volumes each time, that could indicate a different problem from one who urinates 4 to 7 times a day and has small volumes. The time of urinating is also important. Most urinating should be done during the waking hours. Getting up at night to urinate frequently can indicate some problems. CAUSES  The bladder is the organ in your lower abdomen that holds urine. Like a balloon, it swells some as it fills up. Your nerves sense this and tell you it is time to head for the bathroom. There are a number of reasons that you might feel the need to urinate more often than usual. They include:  Urinary tract infection. This is usually associated with other signs such as burning when you urinate.  In men, problems with the prostate (a walnut-size gland that is  located near the tube that carries urine out of your body). There are two reasons why the prostate can cause an increased frequency of urination:  An enlarged prostate that does not let the bladder empty well. If the bladder only half empties when you urinate, then it only has half the capacity to fill before you have to urinate again.  The nerves in the bladder become more hypersensitive with an increased size of the prostate even if the bladder empties completely.  Pregnancy.  Obesity. Excess weight is more likely to cause a problem for women than for men.  Bladder stones or other bladder problems.  Caffeine.  Alcohol.  Medications. For example, drugs that help the body get rid of extra fluid (diuretics) increase urine production. Some other medicines must be taken with lots of fluids.  Muscle or nerve weakness. This might be the result of a spinal cord injury, a stroke, multiple sclerosis, or Parkinson disease.  Long-standing diabetes can decrease the sensation of the bladder. This loss of sensation makes it harder to sense the bladder needs to be emptied. Over a period of years, the bladder is stretched out by constant overfilling. This weakens the bladder muscles so that the bladder does not empty well and has less capacity to fill with new urine.  Interstitial cystitis (also called painful bladder syndrome). This condition develops because the tissues that line the inside of the bladder are inflamed (inflammation is the body's way of reacting to injury or infection). It causes pain and frequent urination. It occurs in women more often than in men. DIAGNOSIS   To decide what might be causing your urinary frequency, your health care provider will probably:  Ask about symptoms you have noticed.  Ask about your overall health. This will include questions about any medications you are taking.  Do a physical examination.  Order some tests. These might include:  A blood test to  check for diabetes or other health issues that could be contributing to the problem.  Urine testing. This could measure the flow of urine and the pressure on the bladder.  A test of your neurological system (the brain, spinal cord, and nerves). This is the system that senses the need to urinate.  A bladder test to check whether it is emptying completely when you urinate.  Cystoscopy. This test uses a thin tube with a tiny camera on it. It offers a look inside your urethra and bladder to see if there are problems.  Imaging tests. You might be given a contrast dye and then asked to urinate. X-rays are taken to see how your bladder is working. TREATMENT  It is important for you to be evaluated to determine if the amount or frequency that you have is unusual or abnormal. If it is found to be abnormal, the cause should be determined and this can usually be found out easily. Depending upon the cause, treatment could include medication, stimulation of the nerves, or surgery. There are not too many things that you can do as an individual to change your urinary frequency. It is important that you balance the amount of fluid intake needed to compensate for your activity and the temperature. Medical problems will be diagnosed and taken care of by your physician. There is no particular bladder training such as Kegel exercises that you can do to help urinary frequency. This is an exercise that is usually recommended for people who have leaking of urine when they laugh, cough, or sneeze. HOME CARE INSTRUCTIONS   Take any medications your health care provider prescribed or suggested. Follow the directions carefully.  Practice any lifestyle changes that are recommended. These might include:  Drinking less fluid or drinking at different times of the day. If you need to urinate often during the night, for example, you may need to stop drinking fluids early in the evening.  Cutting down on caffeine or alcohol.  They both can make you need to urinate more often than normal. Caffeine is found in coffee, tea, and sodas.  Losing weight, if that is recommended.  Keep a journal or a log. You might be asked to record how much you drink and when and where you feel the need to urinate. This will also help evaluate how well the treatment provided by your physician is working. SEEK MEDICAL CARE IF:   Your need to urinate often gets worse.  You feel increased pain or irritation when you urinate.  You notice blood in your urine.  You have questions about any medications that your health care provider recommended.  You notice blood, pus, or swelling at the site of any test or treatment procedure.  You develop a fever of more than 100.47F (38.1C). SEEK IMMEDIATE MEDICAL CARE IF:  You develop a fever of more than 102.48F (38.9C). Document Released: 10/26/2008 Document Revised: 05/16/2013 Document Reviewed: 10/26/2008 [facility] Patient Information 2015 [place], [place]. This information is not intended to replace advice given to you by your health care provider. Make sure you discuss any questions you have with your health care provider.

## 2014-01-20 NOTE — ED Notes (Signed)
Pt c/o right arm pain, back pain and dysuria

## 2014-01-20 NOTE — ED Notes (Signed)
Wait time discussed

## 2014-01-20 NOTE — ED Notes (Signed)
Patient transported to X-ray 

## 2014-01-20 NOTE — ED Provider Notes (Signed)
Patient seen and evaluated. Care discussed with Dr. Rolene Arbour.  Patient primarily denotes upper arm pain. Seems to be tender or tricep. Normal range of motion of the joint of the shoulder elbow and wrist. O2 amount is on urinalysis. Patient reassured. Plan will be sent back treatment and plan for muscle relaxant. Stay hydrated.  Tanna Furry, MD 01/20/14 [phone]

## 2014-01-20 NOTE — ED Provider Notes (Signed)
CSN: [id number]     Arrival date & time 01/20/14  1821 History   First MD Initiated Contact with Patient 01/20/14 2243     Chief Complaint  Patient presents with  . Arm Pain  . Back Pain  . Dysuria     (Consider location/radiation/quality/duration/timing/severity/associated sxs/prior Treatment) Patient is a 49 y.o. female presenting with extremity pain, back pain, and dysuria. The history is provided by the patient.  Extremity Pain This is a new (right elbow) problem. The current episode started yesterday. The problem occurs constantly. The problem has been gradually worsening. Associated symptoms include arthralgias. Pertinent negatives include no abdominal pain, chest pain, chills, coughing, diaphoresis, fatigue, fever, headaches, joint swelling, myalgias, nausea, neck pain, numbness, rash, sore throat, vomiting or weakness. The symptoms are aggravated by bending. She has tried nothing for the symptoms. The treatment provided no relief.  Back Pain Location:  Lumbar spine Quality:  Aching Radiates to:  Does not radiate Pain severity:  Moderate Pain is:  Worse during the day Onset quality:  Gradual Duration:  2 days Timing:  Constant Progression:  Worsening Chronicity:  Chronic Relieved by:  None tried Worsened by:  Movement Ineffective treatments:  None tried Associated symptoms: dysuria   Associated symptoms: no abdominal pain, no bladder incontinence, no bowel incontinence, no chest pain, no fever, no headaches, no leg pain, no numbness, no paresthesias, no pelvic pain, no perianal numbness and no weakness   Dysuria Pain quality:  Burning Pain severity:  Moderate Onset quality:  Gradual Duration:  2 days Timing:  Constant Progression:  Worsening Chronicity:  Recurrent Recent urinary tract infections: no   Relieved by:  Nothing Worsened by:  Nothing tried Ineffective treatments:  None tried Associated symptoms: no abdominal pain, no fever, no nausea, no vaginal discharge  and no vomiting   Risk factors: no hx of pyelonephritis     Past Medical History  Diagnosis Date  . Obesity   . Chronic pain   . Depression   . Arthritis   . Asthma   . Hypertension   . Anxiety    Past Surgical History  Procedure Laterality Date  . Appendectomy    . Tubal ligation  1995   Family History  Problem Relation Age of Onset  . Heart disease Mother   . Early death Mother   . Asthma Mother   . Diabetes Mother   . Hypertension Mother   . Cancer Father   . Alcohol abuse Father   . Early death Father   . Hypertension Father   . Asthma Sister   . Cancer Sister   . Hypertension Sister   . Depression Brother   . Drug abuse Brother   . Hypertension Brother    History  Substance Use Topics  . Smoking status: Current Every Day Smoker -- 0.50 packs/day    Types: Cigarettes  . Smokeless tobacco: Not on file  . Alcohol Use: No   OB History    No data available     Review of Systems  Constitutional: Negative for fever, chills, diaphoresis, activity change, appetite change and fatigue.  HENT: Negative for facial swelling, rhinorrhea, sore throat, trouble swallowing and voice change.   Eyes: Negative for photophobia, pain and visual disturbance.  Respiratory: Negative for cough, shortness of breath, wheezing and stridor.   Cardiovascular: Negative for chest pain, palpitations and leg swelling.  Gastrointestinal: Negative for nausea, vomiting, abdominal pain, constipation, anal bleeding and bowel incontinence.  Endocrine: Negative.   Genitourinary: Positive for  dysuria. Negative for bladder incontinence, vaginal bleeding, vaginal discharge, vaginal pain and pelvic pain.  Musculoskeletal: Positive for back pain and arthralgias. Negative for myalgias, joint swelling, gait problem, neck pain and neck stiffness.  Skin: Negative.  Negative for rash.  Allergic/Immunologic: Negative.   Neurological: Negative for dizziness, tremors, syncope, weakness, numbness, headaches  and paresthesias.  Psychiatric/Behavioral: Negative for suicidal ideas, sleep disturbance and self-injury.  All other systems reviewed and are negative.     Allergies  Review of patient's allergies indicates no known allergies.  Home Medications   Prior to Admission medications   Medication Sig Start Date End Date Taking? Authorizing Provider  albuterol (PROVENTIL HFA;VENTOLIN HFA) 108 (90 BASE) MCG/ACT inhaler Inhale 2 puffs into the lungs every 6 (six) hours as needed. For shortness of breath 12/08/12  Yes Olugbemiga E Doreene Burke, MD  buPROPion (WELLBUTRIN XL) 300 MG 24 hr tablet Take 1 tablet (300 mg total) by mouth daily. 07/25/13  Yes Benjamine Mola, FNP  hydrochlorothiazide (HYDRODIURIL) 25 MG tablet Take 1 tablet (25 mg total) by mouth daily. 11/01/13  Yes Lorayne Marek, MD  ibuprofen (ADVIL,MOTRIN) 600 MG tablet Take 1 tablet (600 mg total) by mouth every 8 (eight) hours as needed. Patient taking differently: Take 600 mg by mouth every 8 (eight) hours as needed for moderate pain.  08/09/13  Yes Lorayne Marek, MD  Oxcarbazepine (TRILEPTAL) 300 MG tablet Take 300 mg by mouth 2 (two) times daily.   Yes Historical Provider, MD  traZODone (DESYREL) 100 MG tablet Take 1 tablet (100 mg total) by mouth at bedtime. 08/09/13  Yes Lorayne Marek, MD  busPIRone (BUSPAR) 5 MG tablet Take 1 tablet (5 mg total) by mouth 2 (two) times daily. Patient not taking: Reported on 01/20/2014 07/25/13   Benjamine Mola, FNP  ciprofloxacin (CIPRO) 250 MG tablet Take 1 tablet (250 mg total) by mouth 2 (two) times daily. Patient not taking: Reported on 01/20/2014 07/25/13   Benjamine Mola, FNP  diclofenac (VOLTAREN) 75 MG EC tablet Take 1 tablet (75 mg total) by mouth 3 (three) times daily. Patient not taking: Reported on 01/20/2014 09/13/13   John C Pick-Jacobs, DO  methocarbamol (ROBAXIN) 500 MG tablet Take 1 tablet (500 mg total) by mouth 2 (two) times daily. 01/20/14   Tanna Furry, MD  naproxen (NAPROSYN) 500 MG tablet Take 1  tablet (500 mg total) by mouth 2 (two) times daily. 01/20/14   Tanna Furry, MD   BP 128/85 mmHg  Pulse 73  Temp(Src) 98.6 F (37 C) (Oral)  Resp 15  Ht 5\' 3"  (1.6 m)  Wt 163 lb (73.936 kg)  BMI 28.88 kg/m2  SpO2 100% Physical Exam  Constitutional: She is oriented to person, place, and time. She appears well-developed and well-nourished. No distress.  HENT:  Head: Normocephalic and atraumatic.  Right Ear: External ear normal.  Left Ear: External ear normal.  Mouth/Throat: Oropharynx is clear and moist. No oropharyngeal exudate.  Eyes: Conjunctivae and EOM are normal. Pupils are equal, round, and reactive to light. No scleral icterus.  Neck: Normal range of motion. Neck supple. No JVD present. No tracheal deviation present. No thyromegaly present.  Cardiovascular: Normal rate, regular rhythm and intact distal pulses.  Exam reveals no gallop and no friction rub.   No murmur heard. 2+ radial and DP pulses equal bilaterally  Pulmonary/Chest: Effort normal and breath sounds normal. No respiratory distress. She has no wheezes. She has no rales.  Abdominal: Soft. Bowel sounds are normal. She exhibits no  distension. There is no tenderness.  Musculoskeletal: Normal range of motion. She exhibits tenderness (right paraspinal lumbar tenderness, no midline back pain. Right point tenderness to elbow.). She exhibits no edema.  Neurological: She is alert and oriented to person, place, and time. No cranial nerve deficit. She exhibits normal muscle tone. Coordination normal.  5/5 strength in all 4 extremities. No saddle anesthesia. Normal Gait. Patient NVI in bilateral arms  Skin: Skin is warm and dry. She is not diaphoretic. No pallor.  Psychiatric: She has a normal mood and affect. She expresses no homicidal and no suicidal ideation. She expresses no suicidal plans and no homicidal plans.  Nursing note and vitals reviewed.   ED Course  Procedures (including critical care time) Labs Review Labs  Reviewed  URINALYSIS, ROUTINE W REFLEX MICROSCOPIC - Abnormal; Notable for the following:    Leukocytes, UA TRACE (*)    All other components within normal limits  URINE MICROSCOPIC-ADD ON - Abnormal; Notable for the following:    Squamous Epithelial / LPF FEW (*)    Bacteria, UA FEW (*)    All other components within normal limits  POC URINE PREG, ED    Imaging Review Dg Elbow Complete Right  01/21/2014   CLINICAL DATA:  Worsening right elbow pain  EXAM: RIGHT ELBOW - COMPLETE 3+ VIEW  COMPARISON:  None.  FINDINGS: No evidence of fracture of the ulna or humerus. The radial head is normal. No joint effusion.  IMPRESSION: No fracture or dislocation.   Electronically Signed   By: Suzy Bouchard M.D.   On: 01/21/2014 00:11     EKG Interpretation None      MDM   Final diagnoses:  Elbow joint pain, right  Urinary frequency  Right-sided low back pain without sciatica    The patient is a 49 year old female with chronic pain who presents for 3 days of right elbow pain with no history of trauma, 3 days paraspinal lumbar back pain with no red flag symptoms, and 2 days of burning upon urination. Patient is afebrile hemodynamically stable with exam reassuring as above. Pregnancy test negative and urine indicated of any acute infection. Remainder of physical exam as above. Lymphoma patient's right elbow does not show any fracture, malalignment, or effusion. Patient is given nonnarcotic pain control and upon reevaluation reports improvement of her symptoms. Do not suspect acute pathology patient's pain is likely most skeletal. The patient would benefit from outpatient follow-up with her primary care doctor. Patient states understanding and agreement with this plan.  I estimate there is LOW risk for ABDOMINAL AORTIC ANEURYSM, CAUDA EQUINA SYNDROME, or EPIDURAL MASS LESION, thus I consider the discharge disposition reasonable. We have discussed the diagnosis and risks, and we agree with discharging  home to follow-up with their primary doctor. We also discussed returning to the Emergency Department immediately if new or worsening symptoms occur. We have discussed the symptoms which are most concerning (e.g., saddle anesthesia, urinary or bowel incontinence or retention, changing or worsening pain) that necessitate immediate return.  Patient seen with attending, Dr. Jeneen Rinks, who oversaw clinical decision making.    Margaretann Loveless, MD 01/21/14 Laureen Abrahams  Tanna Furry, MD 01/31/14 [phone]

## 2014-10-11 DIAGNOSIS — M5416 Radiculopathy, lumbar region: Secondary | ICD-10-CM | POA: Diagnosis not present

## 2014-10-11 DIAGNOSIS — M9903 Segmental and somatic dysfunction of lumbar region: Secondary | ICD-10-CM | POA: Diagnosis not present

## 2014-11-05 ENCOUNTER — Encounter (HOSPITAL_COMMUNITY): Payer: Self-pay | Admitting: Emergency Medicine

## 2014-11-05 ENCOUNTER — Emergency Department (HOSPITAL_COMMUNITY)
Admission: EM | Admit: 2014-11-05 | Discharge: 2014-11-05 | Disposition: A | Discharge status: Home / Self Care | Payer: Self-pay | Attending: Emergency Medicine | Admitting: Emergency Medicine

## 2014-11-05 DIAGNOSIS — F419 Anxiety disorder, unspecified: Secondary | ICD-10-CM | POA: Insufficient documentation

## 2014-11-05 DIAGNOSIS — I10 Essential (primary) hypertension: Secondary | ICD-10-CM | POA: Insufficient documentation

## 2014-11-05 DIAGNOSIS — Y998 Other external cause status: Secondary | ICD-10-CM | POA: Insufficient documentation

## 2014-11-05 DIAGNOSIS — Z720 Tobacco use: Secondary | ICD-10-CM | POA: Insufficient documentation

## 2014-11-05 DIAGNOSIS — F329 Major depressive disorder, single episode, unspecified: Secondary | ICD-10-CM | POA: Insufficient documentation

## 2014-11-05 DIAGNOSIS — Y9289 Other specified places as the place of occurrence of the external cause: Secondary | ICD-10-CM | POA: Insufficient documentation

## 2014-11-05 DIAGNOSIS — E669 Obesity, unspecified: Secondary | ICD-10-CM | POA: Insufficient documentation

## 2014-11-05 DIAGNOSIS — J45909 Unspecified asthma, uncomplicated: Secondary | ICD-10-CM | POA: Insufficient documentation

## 2014-11-05 DIAGNOSIS — Z79899 Other long term (current) drug therapy: Secondary | ICD-10-CM | POA: Insufficient documentation

## 2014-11-05 DIAGNOSIS — M25562 Pain in left knee: Secondary | ICD-10-CM

## 2014-11-05 DIAGNOSIS — Z791 Long term (current) use of non-steroidal anti-inflammatories (NSAID): Secondary | ICD-10-CM | POA: Insufficient documentation

## 2014-11-05 DIAGNOSIS — Y9389 Activity, other specified: Secondary | ICD-10-CM | POA: Insufficient documentation

## 2014-11-05 DIAGNOSIS — X58XXXA Exposure to other specified factors, initial encounter: Secondary | ICD-10-CM | POA: Insufficient documentation

## 2014-11-05 DIAGNOSIS — G8929 Other chronic pain: Secondary | ICD-10-CM | POA: Insufficient documentation

## 2014-11-05 DIAGNOSIS — S8992XA Unspecified injury of left lower leg, initial encounter: Secondary | ICD-10-CM | POA: Insufficient documentation

## 2014-11-05 DIAGNOSIS — M1990 Unspecified osteoarthritis, unspecified site: Secondary | ICD-10-CM | POA: Insufficient documentation

## 2014-11-05 NOTE — ED Notes (Signed)
Declined W/C at D/C and was escorted to lobby by RN. 

## 2014-11-05 NOTE — Discharge Instructions (Signed)

## 2014-11-05 NOTE — ED Notes (Signed)
Pt. Stated, My left knee has ben popping popping for about a week now, and it is in pain

## 2014-11-05 NOTE — ED Provider Notes (Signed)
CSN: [id number]     Arrival date & time 11/05/14  1316 History   First MD Initiated Contact with Patient 11/05/14 1327     Chief Complaint  Patient presents with  . Knee Pain     (Consider location/radiation/quality/duration/timing/severity/associated sxs/prior Treatment) HPI   49 year old obese female with history of chronic pain, arthritis, anxiety presenting ED with her husband who is also another patient with complaints of left knee pain. Patient report for the past week she has had recurrent pain to the left knee which she described as a sharp and throbbing sensation with occasional popping sensation when she walks. Pain is moderate in intensity. She has been taking Motrin without relief. Pain improves with resting. No associated fever, back pain, hip or ankle pain, numbness or weakness. She denies any specific injury or recent trauma. No other complaint.  Past Medical History  Diagnosis Date  . Obesity   . Chronic pain   . Depression   . Arthritis   . Asthma   . Hypertension   . Anxiety    Past Surgical History  Procedure Laterality Date  . Appendectomy    . Tubal ligation  1995   Family History  Problem Relation Age of Onset  . Heart disease Mother   . Early death Mother   . Asthma Mother   . Diabetes Mother   . Hypertension Mother   . Cancer Father   . Alcohol abuse Father   . Early death Father   . Hypertension Father   . Asthma Sister   . Cancer Sister   . Hypertension Sister   . Depression Brother   . Drug abuse Brother   . Hypertension Brother    Social History  Substance Use Topics  . Smoking status: Current Every Day Smoker -- 0.50 packs/day    Types: Cigarettes  . Smokeless tobacco: None  . Alcohol Use: No   OB History    No data available     Review of Systems  Constitutional: Negative for fever.  Musculoskeletal: Positive for arthralgias.  Skin: Negative for rash and wound.  Neurological: Negative for numbness.      Allergies   Review of patient's allergies indicates no known allergies.  Home Medications   Prior to Admission medications   Medication Sig Start Date End Date Taking? Authorizing Provider  albuterol (PROVENTIL HFA;VENTOLIN HFA) 108 (90 BASE) MCG/ACT inhaler Inhale 2 puffs into the lungs every 6 (six) hours as needed. For shortness of breath 12/08/12   Tresa Garter, MD  buPROPion (WELLBUTRIN XL) 300 MG 24 hr tablet Take 1 tablet (300 mg total) by mouth daily. 07/25/13   Benjamine Mola, FNP  busPIRone (BUSPAR) 5 MG tablet Take 1 tablet (5 mg total) by mouth 2 (two) times daily. Patient not taking: Reported on 01/20/2014 07/25/13   Benjamine Mola, FNP  ciprofloxacin (CIPRO) 250 MG tablet Take 1 tablet (250 mg total) by mouth 2 (two) times daily. Patient not taking: Reported on 01/20/2014 07/25/13   Benjamine Mola, FNP  diclofenac (VOLTAREN) 75 MG EC tablet Take 1 tablet (75 mg total) by mouth 3 (three) times daily. Patient not taking: Reported on 01/20/2014 09/13/13   John C Pick-Jacobs, DO  hydrochlorothiazide (HYDRODIURIL) 25 MG tablet Take 1 tablet (25 mg total) by mouth daily. 11/01/13   Lorayne Marek, MD  ibuprofen (ADVIL,MOTRIN) 600 MG tablet Take 1 tablet (600 mg total) by mouth every 8 (eight) hours as needed. Patient taking differently: Take 600 mg by  mouth every 8 (eight) hours as needed for moderate pain.  08/09/13   Lorayne Marek, MD  methocarbamol (ROBAXIN) 500 MG tablet Take 1 tablet (500 mg total) by mouth 2 (two) times daily. 01/20/14   Tanna Furry, MD  naproxen (NAPROSYN) 500 MG tablet Take 1 tablet (500 mg total) by mouth 2 (two) times daily. 01/20/14   Tanna Furry, MD  Oxcarbazepine (TRILEPTAL) 300 MG tablet Take 300 mg by mouth 2 (two) times daily.    Historical Provider, MD  traZODone (DESYREL) 100 MG tablet Take 1 tablet (100 mg total) by mouth at bedtime. 08/09/13   Lorayne Marek, MD   BP 140/71 mmHg  Pulse 72  Temp(Src) 98.7 F (37.1 C)  Resp 1  Ht 5\' 3"  (1.6 m)  Wt 165 lb (74.844 kg)   BMI 29.24 kg/m2  SpO2 99%  LMP 01/29/2013 Physical Exam  Constitutional: She appears well-developed and well-nourished. No distress.  HENT:  Head: Atraumatic.  Eyes: Conjunctivae are normal.  Neck: Neck supple.  Musculoskeletal: She exhibits tenderness (Left knee: Tenderness to inferior patella region on palpation without swelling or overlying skin changes. Negative anterior and posterior scar test. Normal varus and valgus. No joint laxity. The patella is located.).  Left hip and left ankle without tenderness to palpation. Dorsalis pedis pulses palpable, brisk cap refill to toes.  Neurological: She is alert.  Skin: No rash noted.  Psychiatric: She has a normal mood and affect.  Nursing note and vitals reviewed.   ED Course  Procedures (including critical care time)  Patient complains of left knee pain. This is an atraumatic pain. No signs to suggest septic joint or infection. No obvious joint laxity and patient ambulate without difficulty. Recommend continue with NSAIDs, and rice therapy. Knee sleeve provided for support. Outpatient follow-up with orthopedic specialist as needed.    MDM   Final diagnoses:  Left knee pain    BP 131/75 mmHg  Pulse 65  Temp(Src) 98.7 F (37.1 C) (Oral)  Resp 16  Ht 5\' 3"  (1.6 m)  Wt 165 lb (74.844 kg)  BMI 29.24 kg/m2  SpO2 100%  LMP 01/29/2013     Domenic Moras, PA-C 11/05/14 1400  Virgel Manifold, MD 11/07/14 [phone]

## 2014-12-06 ENCOUNTER — Encounter: Payer: Self-pay | Admitting: Family Medicine

## 2014-12-06 ENCOUNTER — Ambulatory Visit: Payer: Self-pay | Attending: Family Medicine | Admitting: Family Medicine

## 2014-12-06 VITALS — BP 150/83 | HR 66 | Temp 98.5°F | Resp 16 | Ht 63.0 in | Wt 170.0 lb

## 2014-12-06 DIAGNOSIS — R200 Anesthesia of skin: Secondary | ICD-10-CM | POA: Insufficient documentation

## 2014-12-06 DIAGNOSIS — M25562 Pain in left knee: Secondary | ICD-10-CM | POA: Insufficient documentation

## 2014-12-06 DIAGNOSIS — G8929 Other chronic pain: Secondary | ICD-10-CM | POA: Insufficient documentation

## 2014-12-06 DIAGNOSIS — I10 Essential (primary) hypertension: Secondary | ICD-10-CM | POA: Insufficient documentation

## 2014-12-06 DIAGNOSIS — F17210 Nicotine dependence, cigarettes, uncomplicated: Secondary | ICD-10-CM | POA: Insufficient documentation

## 2014-12-06 DIAGNOSIS — Z79899 Other long term (current) drug therapy: Secondary | ICD-10-CM | POA: Insufficient documentation

## 2014-12-06 DIAGNOSIS — R208 Other disturbances of skin sensation: Secondary | ICD-10-CM

## 2014-12-06 LAB — COMPLETE METABOLIC PANEL WITH GFR
ALT: 12 U/L (ref 6–29)
AST: 16 U/L (ref 10–35)
Albumin: 4.1 g/dL (ref 3.6–5.1)
Alkaline Phosphatase: 82 U/L (ref 33–130)
BILIRUBIN TOTAL: 0.3 mg/dL (ref 0.2–1.2)
BUN: 11 mg/dL (ref 7–25)
CO2: 28 mmol/L (ref 20–31)
CREATININE: 0.72 mg/dL (ref 0.50–1.05)
Calcium: 9.5 mg/dL (ref 8.6–10.4)
Chloride: 106 mmol/L (ref 98–110)
GFR, Est Non African American: >89 mL/min (ref >=60)
GLUCOSE: 82 mg/dL (ref 65–99)
Potassium: 4.6 mmol/L (ref 3.5–5.3)
Sodium: 139 mmol/L (ref 135–146)
TOTAL PROTEIN: 7.3 g/dL (ref 6.1–8.1)

## 2014-12-06 LAB — CBC
HCT: 41.4 % (ref 36.0–46.0)
Hemoglobin: 13.7 g/dL (ref 12.0–15.0)
MCH: 27.8 pg (ref 26.0–34.0)
MCHC: 33.1 g/dL (ref 30.0–36.0)
MCV: 84.1 fL (ref 78.0–100.0)
MPV: 9.7 fL (ref 8.6–12.4)
PLATELETS: 198 10*3/uL (ref 150–400)
RBC: 4.92 MIL/uL (ref 3.87–5.11)
RDW: 14.3 % (ref 11.5–15.5)
WBC: 7.3 10*3/uL (ref 4.0–10.5)

## 2014-12-06 LAB — TSH: TSH: 0.523 u[IU]/mL (ref 0.350–4.500)

## 2014-12-06 LAB — VITAMIN B12: Vitamin B-12: 441 pg/mL (ref 211–911)

## 2014-12-06 LAB — GLUCOSE, POCT (MANUAL RESULT ENTRY): POC GLUCOSE: 88 mg/dL (ref 70–99)

## 2014-12-06 LAB — POCT GLYCOSYLATED HEMOGLOBIN (HGB A1C): Hemoglobin A1C: 5.3

## 2014-12-06 MED ORDER — GABAPENTIN 300 MG PO CAPS: 300.0000 mg | ORAL_CAPSULE | Freq: Every day | ORAL | Status: DC

## 2014-12-06 MED ORDER — HYDROCHLOROTHIAZIDE 25 MG PO TABS: 25.0000 mg | ORAL_TABLET | Freq: Every day | ORAL | Status: DC

## 2014-12-06 NOTE — Progress Notes (Signed)
Subjective:  Patient ID: Brittany Jimenez, female    DOB: [birth year]  Age: 49 y.o. MRN: [id number]  CC: Knee Pain and Numbness   HPI [place] Highsmith presents for    1. L knee pain: chronic. Since fall onto L knee in 2012. Patient's R foot got caught in a hole and she landed on her L knee.  Has been seen in ED for knee pain that has worsened over the last month. She endorses locking, popping, swelling. She walks with a cane.   2. HTN: out of HCTZ for about 4 months. No HA, CP, SOB, blurry vision or swelling in legs. Gets anxiety attacks.   3. R foot numbness: x month. At ball of foot. No known injury. No weakness in foot. However, movements are slow. No back pain. L foot is not affected.   Social History  Substance Use Topics  . Smoking status: Current Every Day Smoker -- 0.50 packs/day    Types: Cigarettes  . Smokeless tobacco: Not on file  . Alcohol Use: No    Outpatient Prescriptions Prior to Visit  Medication Sig Dispense Refill  . albuterol (PROVENTIL HFA;VENTOLIN HFA) 108 (90 BASE) MCG/ACT inhaler Inhale 2 puffs into the lungs every 6 (six) hours as needed. For shortness of breath 1 Inhaler 1 year  . buPROPion (WELLBUTRIN XL) 300 MG 24 hr tablet Take 1 tablet (300 mg total) by mouth daily. 30 tablet 0  . busPIRone (BUSPAR) 5 MG tablet Take 1 tablet (5 mg total) by mouth 2 (two) times daily. (Patient not taking: Reported on 01/20/2014) 60 tablet 0  . ciprofloxacin (CIPRO) 250 MG tablet Take 1 tablet (250 mg total) by mouth 2 (two) times daily. (Patient not taking: Reported on 01/20/2014) 6 tablet 0  . diclofenac (VOLTAREN) 75 MG EC tablet Take 1 tablet (75 mg total) by mouth 3 (three) times daily. (Patient not taking: Reported on 01/20/2014) 60 tablet 3  . hydrochlorothiazide (HYDRODIURIL) 25 MG tablet Take 1 tablet (25 mg total) by mouth daily. 30 tablet 3  . ibuprofen (ADVIL,MOTRIN) 600 MG tablet Take 1 tablet (600 mg total) by mouth every 8 (eight) hours as needed.  (Patient taking differently: Take 600 mg by mouth every 8 (eight) hours as needed for moderate pain. ) 30 tablet 1  . methocarbamol (ROBAXIN) 500 MG tablet Take 1 tablet (500 mg total) by mouth 2 (two) times daily. 20 tablet 0  . naproxen (NAPROSYN) 500 MG tablet Take 1 tablet (500 mg total) by mouth 2 (two) times daily. 30 tablet 0  . Oxcarbazepine (TRILEPTAL) 300 MG tablet Take 300 mg by mouth 2 (two) times daily.    . traZODone (DESYREL) 100 MG tablet Take 1 tablet (100 mg total) by mouth at bedtime. 30 tablet 0   No facility-administered medications prior to visit.    ROS Review of Systems  Constitutional: Negative for fever and chills.  Eyes: Negative for visual disturbance.  Respiratory: Negative for shortness of breath.   Cardiovascular: Negative for chest pain.  Gastrointestinal: Negative for abdominal pain and blood in stool.  Musculoskeletal: Positive for arthralgias. Negative for back pain.  Skin: Negative for rash.  Allergic/Immunologic: Negative for immunocompromised state.  Neurological: Positive for numbness.  Hematological: Negative for adenopathy. Does not bruise/bleed easily.  Psychiatric/Behavioral: Negative for suicidal ideas and dysphoric mood.    Objective:  BP 150/83 mmHg  Pulse 66  Temp(Src) 98.5 F (36.9 C) (Oral)  Resp 16  Ht 5\' 3"  (1.6 m)  Wt 170 lb (  77.111 kg)  BMI 30.12 kg/m2  SpO2 100%  LMP 01/29/2013  BP/Weight 12/06/2014 [id number]  Systolic BP [id number]  Diastolic BP 83 75 85  Wt. (Lbs) 170 165 163  BMI 30.12 29.24 28.88  Some encounter information is confidential and restricted. Go to Review Flowsheets activity to see all data.    Physical Exam  Constitutional: She is oriented to person, place, and time. She appears well-developed and well-nourished. No distress.  HENT:  Head: Normocephalic and atraumatic.  Cardiovascular: Normal rate, regular rhythm, normal heart sounds and intact distal pulses.   Pulses:      Dorsalis  pedis pulses are 2+ on the right side, and 2+ on the left side.       Posterior tibial pulses are 2+ on the right side, and 2+ on the left side.  Pulmonary/Chest: Effort normal and breath sounds normal.  Musculoskeletal: She exhibits no edema.       Left knee: She exhibits decreased range of motion and swelling. She exhibits no effusion, no ecchymosis, no laceration, no erythema, normal alignment, no LCL laxity, normal patellar mobility, no bony tenderness, normal meniscus and no MCL laxity. Tenderness found. Medial joint line and lateral joint line tenderness noted. No MCL, no LCL and no patellar tendon tenderness noted.  Neurological: She is alert and oriented to person, place, and time.  Reflex Scores:      Achilles reflexes are 0 on the right side. R foot with decreased dorsi and plantar flexion strength Lack of sensation to light touch from ankle to toes  Tenderness at toes and MTP joints w/o swelling, redness or deformity   Skin: Skin is warm and dry. No rash noted.  Psychiatric: She has a normal mood and affect.     Assessment & Plan:   Problem List Items Addressed This Visit    Chronic pain of left knee (Chronic)    A: chronic knee pain following injury 4 years ago, worsening. Patient declines steroid injection. Patient concerned about soft tissue injury P: [facility] [address] referral to [place]       Relevant Medications   gabapentin (NEURONTIN) 300 MG capsule   Other Relevant Orders   Ambulatory referral to Sports Medicine   Hypertension, essential, benign - Primary (Chronic)    A: HTN, untreated P: Restart HCTZ       Relevant Medications   hydrochlorothiazide (HYDRODIURIL) 25 MG tablet   Other Relevant Orders   COMPLETE METABOLIC PANEL WITH GFR   Numbness of foot (Chronic)    A: acute onset of numbness in R foot. No injury. Normal A1c/CBG. No evidence of lumbar radiculopathy  P: Lab evaluation per orders       Relevant Medications   gabapentin (NEURONTIN) 300 MG capsule   Other  Relevant Orders   TSH   HgB A1c (Completed)   Glucose (CBG) (Completed)   Vitamin B12   CBC      No orders of the defined types were placed in this encounter.    Follow-up: No Follow-up on file.   Boykin Nearing MD

## 2014-12-06 NOTE — Assessment & Plan Note (Signed)
A: acute onset of numbness in R foot. No injury. Normal A1c/CBG. No evidence of lumbar radiculopathy  P: Lab evaluation per orders

## 2014-12-06 NOTE — Assessment & Plan Note (Signed)
A: HTN, untreated P: Restart HCTZ

## 2014-12-06 NOTE — Progress Notes (Signed)
C/C Lt  Knee pain, numbness on rt foot. Pain scale #9  Tobacco user 4 cigarette per day  No suicide thought use  Stated no BP medication x 4 month

## 2014-12-06 NOTE — Assessment & Plan Note (Signed)
A: chronic knee pain following injury 4 years ago, worsening. Patient declines steroid injection. Patient concerned about soft tissue injury P: [facility] referral to [place]

## 2014-12-06 NOTE — Patient Instructions (Addendum)
Brittany Jimenez was seen today for knee pain and numbness.  Diagnoses and all orders for this visit:  Hypertension, essential, benign -     hydrochlorothiazide (HYDRODIURIL) 25 MG tablet; Take 1 tablet (25 mg total) by mouth daily. -     COMPLETE METABOLIC PANEL WITH GFR  Numbness of foot -     TSH -     HgB A1c -     Glucose (CBG) -     Vitamin B12 -     CBC -     gabapentin (NEURONTIN) 300 MG capsule; Take 1 capsule (300 mg total) by mouth at bedtime.  Chronic pain of left knee -     Ambulatory referral to Sports Medicine   You will be called with lab results  F/u in 6 weeks for knee pain, foot numbness and HTN  Dr. Adrian Blackwater

## 2014-12-11 ENCOUNTER — Other Ambulatory Visit: Payer: Self-pay | Admitting: Family Medicine

## 2014-12-11 DIAGNOSIS — R200 Anesthesia of skin: Secondary | ICD-10-CM

## 2014-12-11 NOTE — Assessment & Plan Note (Signed)
Labs obtained to evaluate R foot numbness are all normal Also patient is not diabetic. Patient does have hx of R ankle injury with abnormal R ankle MRI on 12/14/2012:  Neurology referral placed for possible nerve conduction studies

## 2014-12-13 ENCOUNTER — Telehealth: Payer: Self-pay | Admitting: *Deleted

## 2014-12-13 DIAGNOSIS — F419 Anxiety disorder, unspecified: Secondary | ICD-10-CM

## 2014-12-13 DIAGNOSIS — F332 Major depressive disorder, recurrent severe without psychotic features: Secondary | ICD-10-CM

## 2014-12-13 NOTE — Telephone Encounter (Signed)
-----   Message from Boykin Nearing, MD sent at 12/11/2014  9:48 AM EST ----- Labs obtained to evaluate R foot numbness are all normal Also patient is not diabetic. Patient does have hx of R ankle injury with abnormal R ankle MRI on 12/14/2012:  Neurology referral placed for possible nerve conduction studies

## 2014-12-13 NOTE — Telephone Encounter (Signed)
Left message with female to return call 

## 2014-12-14 NOTE — Telephone Encounter (Signed)
Patient returned phone call, please f/u °

## 2014-12-15 NOTE — Telephone Encounter (Signed)
Patient returned phone call, please f/u °

## 2014-12-20 ENCOUNTER — Other Ambulatory Visit: Payer: Self-pay | Admitting: Sports Medicine

## 2014-12-20 ENCOUNTER — Encounter: Payer: Self-pay | Admitting: Sports Medicine

## 2014-12-20 ENCOUNTER — Ambulatory Visit
Admission: RE | Admit: 2014-12-20 | Discharge: 2014-12-20 | Disposition: A | Discharge status: Home / Self Care | Payer: No Typology Code available for payment source | Source: Ambulatory Visit | Attending: Sports Medicine | Admitting: Sports Medicine

## 2014-12-20 ENCOUNTER — Ambulatory Visit (INDEPENDENT_AMBULATORY_CARE_PROVIDER_SITE_OTHER): Payer: Self-pay | Admitting: Sports Medicine

## 2014-12-20 VITALS — BP 146/98 | Ht 63.0 in | Wt 170.0 lb

## 2014-12-20 DIAGNOSIS — G8929 Other chronic pain: Secondary | ICD-10-CM

## 2014-12-20 DIAGNOSIS — M25562 Pain in left knee: Principal | ICD-10-CM

## 2014-12-20 NOTE — Progress Notes (Addendum)
   Subjective:    Patient ID: Brittany Jimenez, female    DOB: 02-04-1965, 49 y.o.   MRN: [id number]  HPI chief complaint: Left knee pain  Very pleasant 49 year old female comes in today complaining of left knee pain that has been present since an injury in 2012. She fell on a flexed knee back in 2012 and since then she has had intermittent pain, popping, and swelling in the knee. X-rays done a year ago were fairly unremarkable. She has been treated with cortisone injections, anti-inflammatory medicines, and compression sleeves. Despite this her symptoms persist. The knee will occasionally want to give way. She denies any prior surgeries to the knee. Minimal pain at rest.    Review of Systems     Objective:   Physical Exam Well-developed, well-nourished. No acute distress  Left knee: Full range of motion. No effusion. She is tender to palpation along the medial joint line with pain but no popping with McMurray's. No tenderness along the lateral joint line. Knee is grossly stable to ligamentous exam. Neurovascularly intact distally.       Assessment & Plan:  Persistent left knee pain status post fall-rule out medial meniscal tear  I will start with getting updated x-rays of the left knee. I will also get an MRI of the left knee specifically to rule out a posttraumatic medial meniscal tear. Patient will follow-up with me in the office 2-3 days after the MRI to go over those results and delineate a more definitive treatment plan.  Addendum: X-rays unremarkable. Proceed with MRI.

## 2014-12-21 ENCOUNTER — Other Ambulatory Visit: Payer: Self-pay | Admitting: *Deleted

## 2014-12-21 MED ORDER — DIAZEPAM 5 MG PO TABS: ORAL_TABLET | ORAL | Status: DC

## 2014-12-22 NOTE — Telephone Encounter (Signed)
Pt. Called requesting a med refill on busPIRone (BUSPAR) 5 MG tablet. Please f/u with pt.

## 2014-12-23 ENCOUNTER — Ambulatory Visit
Admission: RE | Admit: 2014-12-23 | Discharge: 2014-12-23 | Disposition: A | Discharge status: Home / Self Care | Payer: No Typology Code available for payment source | Source: Ambulatory Visit | Attending: Sports Medicine | Admitting: Sports Medicine

## 2014-12-23 DIAGNOSIS — G8929 Other chronic pain: Secondary | ICD-10-CM

## 2014-12-23 DIAGNOSIS — M25562 Pain in left knee: Principal | ICD-10-CM

## 2014-12-23 DIAGNOSIS — S83212A Bucket-handle tear of medial meniscus, current injury, left knee, initial encounter: Secondary | ICD-10-CM | POA: Diagnosis not present

## 2014-12-27 MED ORDER — BUSPIRONE HCL 5 MG PO TABS: 5.0000 mg | ORAL_TABLET | Freq: Two times a day (BID) | ORAL | Status: DC

## 2014-12-27 NOTE — Telephone Encounter (Signed)
buspar refilled 

## 2014-12-28 ENCOUNTER — Ambulatory Visit (INDEPENDENT_AMBULATORY_CARE_PROVIDER_SITE_OTHER): Payer: Self-pay | Admitting: Sports Medicine

## 2014-12-28 ENCOUNTER — Encounter: Payer: Self-pay | Admitting: Sports Medicine

## 2014-12-28 VITALS — BP 146/90 | Ht 63.0 in | Wt 170.0 lb

## 2014-12-28 DIAGNOSIS — M23207 Derangement of unspecified meniscus due to old tear or injury, left knee: Secondary | ICD-10-CM

## 2014-12-28 DIAGNOSIS — M23204 Derangement of unspecified medial meniscus due to old tear or injury, left knee: Secondary | ICD-10-CM

## 2014-12-28 DIAGNOSIS — M23201 Derangement of unspecified lateral meniscus due to old tear or injury, left knee: Secondary | ICD-10-CM

## 2014-12-28 NOTE — Patient Instructions (Signed)
[facility] Dr. Erlinda Hong Friday December 16th at 8:45a [place], [place], [place] [address] Phone: [phone]

## 2014-12-28 NOTE — Progress Notes (Signed)
Patient ID: Brittany Jimenez, female   DOB: [birth year], 49 y.o.   MRN: [id number]  Patient comes in today to discuss MRI findings of her left knee. She has a small grade 3 peripheral tear in the posterior horn of the medial meniscus near the mid body which extends obliquely to the inferior surface. She also has mild to moderate degenerative chondral thinning. Cruciate ligaments are intact. She has had symptoms since 2012. She has failed conservative treatment. I will refer her to orthopedic surgery to discuss merits of possible arthroscopy. Follow-up with me as needed.  Total time spent with the patient was 10 minutes with 100% of the time spent in face to face consultation discussing her diagnosis and treatment plan.

## 2015-01-23 ENCOUNTER — Ambulatory Visit: Payer: Self-pay | Attending: Family Medicine | Admitting: Family Medicine

## 2015-01-23 ENCOUNTER — Encounter: Payer: Self-pay | Admitting: Family Medicine

## 2015-01-23 ENCOUNTER — Other Ambulatory Visit: Payer: Self-pay | Admitting: Family Medicine

## 2015-01-23 VITALS — BP 129/80 | HR 60 | Temp 98.2°F | Resp 16 | Ht 63.0 in | Wt 178.0 lb

## 2015-01-23 DIAGNOSIS — Z1159 Encounter for screening for other viral diseases: Secondary | ICD-10-CM | POA: Insufficient documentation

## 2015-01-23 DIAGNOSIS — Z114 Encounter for screening for human immunodeficiency virus [HIV]: Secondary | ICD-10-CM | POA: Insufficient documentation

## 2015-01-23 DIAGNOSIS — F17200 Nicotine dependence, unspecified, uncomplicated: Secondary | ICD-10-CM | POA: Insufficient documentation

## 2015-01-23 DIAGNOSIS — Z23 Encounter for immunization: Secondary | ICD-10-CM

## 2015-01-23 DIAGNOSIS — Z79899 Other long term (current) drug therapy: Secondary | ICD-10-CM | POA: Insufficient documentation

## 2015-01-23 DIAGNOSIS — Z0000 Encounter for general adult medical examination without abnormal findings: Secondary | ICD-10-CM | POA: Insufficient documentation

## 2015-01-23 DIAGNOSIS — Z720 Tobacco use: Secondary | ICD-10-CM

## 2015-01-23 DIAGNOSIS — I10 Essential (primary) hypertension: Secondary | ICD-10-CM | POA: Insufficient documentation

## 2015-01-23 DIAGNOSIS — F419 Anxiety disorder, unspecified: Secondary | ICD-10-CM | POA: Insufficient documentation

## 2015-01-23 DIAGNOSIS — F332 Major depressive disorder, recurrent severe without psychotic features: Secondary | ICD-10-CM | POA: Insufficient documentation

## 2015-01-23 MED ORDER — HYDROCHLOROTHIAZIDE 25 MG PO TABS: 25.0000 mg | ORAL_TABLET | Freq: Every day | ORAL | Status: DC

## 2015-01-23 MED ORDER — BUSPIRONE HCL 5 MG PO TABS: 5.0000 mg | ORAL_TABLET | Freq: Two times a day (BID) | ORAL | Status: DC

## 2015-01-23 MED FILL — ?BUSPIRONE HCL 5 MG TABLET: 5 MG | 30 days supply | Qty: 60 | Fill #0

## 2015-01-23 MED FILL — HYDROCHLOROTHIAZIDE 25 MG T: 25 | 30 days supply | Qty: 30 | Fill #0

## 2015-01-23 NOTE — Assessment & Plan Note (Signed)
A: HTN, well controlled P: Continue HCTZ

## 2015-01-23 NOTE — Assessment & Plan Note (Signed)
A: depression on buspar, well controlled P: Continue buspar

## 2015-01-23 NOTE — Progress Notes (Signed)
F/U HTN, depression  Pt stated having a Lt knee surgery on Friday  Pain scale #9 Tobacco user- cigarette per day  No suicidal thought in the past two week

## 2015-01-23 NOTE — Progress Notes (Signed)
Patient ID: Brittany Jimenez, female   DOB: 07/29/1965, 50 y.o.   MRN: [id number]    Subjective:  Patient ID: Brittany Jimenez, female    DOB: Jun 06, 1965  Age: 50 y.o. MRN: [id number]  CC: Hypertension and Depression   HPI Brittany Jimenez presents for    1. CHRONIC HYPERTENSION  Disease Monitoring  Blood pressure range: not checking   Chest pain: no   Dyspnea: no   Claudication: no   Medication compliance: yes  Medication Side Effects  Lightheadedness: no   Urinary frequency: no   Edema: no    2. Depression: taking buspar. Mood is doing well. She is looking forward to L knee replacement surgery. No SI.   3. Smoking: has cut down to 2 cigs per day. Plans to quit after her surgery. Feels positive about her knee replacement.    Social History  Substance Use Topics  . Smoking status: Current Every Day Smoker -- 0.50 packs/day    Types: Cigarettes  . Smokeless tobacco: Not on file  . Alcohol Use: No    Outpatient Prescriptions Prior to Visit  Medication Sig Dispense Refill  . albuterol (PROVENTIL HFA;VENTOLIN HFA) 108 (90 BASE) MCG/ACT inhaler Inhale 2 puffs into the lungs every 6 (six) hours as needed. For shortness of breath 1 Inhaler 1 year  . busPIRone (BUSPAR) 5 MG tablet Take 1 tablet (5 mg total) by mouth 2 (two) times daily. 60 tablet 2  . gabapentin (NEURONTIN) 300 MG capsule Take 1 capsule (300 mg total) by mouth at bedtime. 30 capsule 2  . hydrochlorothiazide (HYDRODIURIL) 25 MG tablet Take 1 tablet (25 mg total) by mouth daily. 30 tablet 3  . diazepam (VALIUM) 5 MG tablet Take one hour before your MRI appt (Patient not taking: Reported on 01/23/2015) 2 tablet 0   No facility-administered medications prior to visit.    ROS Review of Systems  Constitutional: Negative for fever and chills.  Eyes: Negative for visual disturbance.  Respiratory: Negative for shortness of breath.   Cardiovascular: Negative for chest pain.  Gastrointestinal: Negative for  abdominal pain and blood in stool.  Musculoskeletal: Positive for arthralgias. Negative for back pain.  Skin: Negative for rash.  Allergic/Immunologic: Negative for immunocompromised state.  Hematological: Negative for adenopathy. Does not bruise/bleed easily.  Psychiatric/Behavioral: Negative for suicidal ideas and dysphoric mood.    Objective:  BP 129/80 mmHg  Pulse 60  Temp(Src) 98.2 F (36.8 C) (Oral)  Resp 16  Ht 5\' 3"  (1.6 m)  Wt 178 lb (80.74 kg)  BMI 31.54 kg/m2  SpO2 100%  LMP 01/29/2013  BP/Weight 01/23/2015 12/28/2014 [id number]  Systolic BP [id number] [id number] 123456  Diastolic BP 80 90 98  Wt. (Lbs) 178 170 170  BMI 31.54 30.12 30.12  Some encounter information is confidential and restricted. Go to Review Flowsheets activity to see all data.   Physical Exam  Constitutional: She is oriented to person, place, and time. She appears well-developed and well-nourished. No distress.  HENT:  Head: Normocephalic and atraumatic.  Cardiovascular: Normal rate, regular rhythm, normal heart sounds and intact distal pulses.   Pulmonary/Chest: Effort normal and breath sounds normal.  Musculoskeletal: She exhibits no edema.  Neurological: She is alert and oriented to person, place, and time.  Skin: Skin is warm and dry. No rash noted.  Psychiatric: She has a normal mood and affect.   Depression screen [facility] 2/9 01/23/2015 12/28/2014 12/20/2014  Decreased Interest 1 0 0  Down, Depressed, Hopeless 1 0 0  PHQ - 2  Score 2 0 0  Altered sleeping 1 - -  Tired, decreased energy 1 - -  Change in appetite 1 - -  Feeling bad or failure about yourself  0 - -  Trouble concentrating 0 - -  Moving slowly or fidgety/restless 0 - -  Suicidal thoughts 0 - -  PHQ-9 Score 5 - -    Assessment & Plan:   Problem List Items Addressed This Visit    Anxiety (Chronic)   Relevant Medications   busPIRone (BUSPAR) 5 MG tablet   Hypertension, essential, benign (Chronic)    A: HTN, well controlled P: Continue  HCTZ       Relevant Medications   hydrochlorothiazide (HYDRODIURIL) 25 MG tablet   MDD (major depressive disorder) (HCC) (Chronic)    A: depression on buspar, well controlled P: Continue buspar       Relevant Medications   busPIRone (BUSPAR) 5 MG tablet   Smoking (Chronic)    Other Visit Diagnoses    Screening for HIV (human immunodeficiency virus)    -  Primary    Relevant Orders    HIV antibody (with reflex)    Need for hepatitis C screening test        Relevant Orders    Hepatitis C antibody, reflex    Healthcare maintenance        Relevant Orders    MM DIGITAL SCREENING BILATERAL       No orders of the defined types were placed in this encounter.    Follow-up: No Follow-up on file.   Boykin Nearing MD

## 2015-01-23 NOTE — Patient Instructions (Signed)
Brittany Jimenez was seen today for hypertension and depression.  Diagnoses and all orders for this visit:  Screening for HIV (human immunodeficiency virus) -     HIV antibody (with reflex)  Need for hepatitis C screening test -     Hepatitis C antibody, reflex  Anxiety -     busPIRone (BUSPAR) 5 MG tablet; Take 1 tablet (5 mg total) by mouth 2 (two) times daily.  Severe episode of recurrent major depressive disorder, without psychotic features ([place]) -     busPIRone (BUSPAR) 5 MG tablet; Take 1 tablet (5 mg total) by mouth 2 (two) times daily.  Hypertension, essential, benign -     hydrochlorothiazide (HYDRODIURIL) 25 MG tablet; Take 1 tablet (25 mg total) by mouth daily.  Smoking  Healthcare maintenance -     MM DIGITAL SCREENING BILATERAL; Future   Blood pressure is great Good job cutting down to 2 cigarettes. Set a quit date and circle it on your calendar.   [place] [facility]cology appointment. Schedule with me in 4-6 weeks for pap smear and breast exam  Dr. Adrian Blackwater

## 2015-01-24 LAB — HEPATITIS C ANTIBODY: HCV Ab: NEGATIVE

## 2015-01-24 LAB — HIV ANTIBODY (ROUTINE TESTING W REFLEX): HIV 1&2 Ab, 4th Generation: NONREACTIVE

## 2015-01-25 ENCOUNTER — Encounter (HOSPITAL_BASED_OUTPATIENT_CLINIC_OR_DEPARTMENT_OTHER): Payer: Self-pay | Admitting: *Deleted

## 2015-01-25 ENCOUNTER — Other Ambulatory Visit (HOSPITAL_BASED_OUTPATIENT_CLINIC_OR_DEPARTMENT_OTHER): Payer: Self-pay | Admitting: Orthopaedic Surgery

## 2015-01-26 ENCOUNTER — Telehealth: Payer: Self-pay | Admitting: *Deleted

## 2015-01-26 NOTE — Telephone Encounter (Signed)
LVM to return call.

## 2015-01-26 NOTE — Telephone Encounter (Signed)
-----   Message from Boykin Nearing, MD sent at 01/25/2015  9:31 AM EST ----- Screening hiv negative

## 2015-01-26 NOTE — Telephone Encounter (Signed)
-----   Message from Boykin Nearing, MD sent at 01/25/2015  9:31 AM EST ----- Screening hep C negative

## 2015-01-29 NOTE — Telephone Encounter (Signed)
Date of birth verified by pt  Normal results HIV Hep c given  Pt verbalized understanding

## 2015-01-29 NOTE — Telephone Encounter (Signed)
Pt. Returned call. Please f/u with pt. °

## 2015-01-31 ENCOUNTER — Ambulatory Visit (HOSPITAL_BASED_OUTPATIENT_CLINIC_OR_DEPARTMENT_OTHER): Payer: Self-pay | Admitting: Certified Registered"

## 2015-01-31 ENCOUNTER — Encounter (HOSPITAL_BASED_OUTPATIENT_CLINIC_OR_DEPARTMENT_OTHER)
Admission: RE | Disposition: A | Discharge status: Home / Self Care | Payer: Self-pay | Source: Ambulatory Visit | Attending: Orthopaedic Surgery

## 2015-01-31 ENCOUNTER — Ambulatory Visit (HOSPITAL_BASED_OUTPATIENT_CLINIC_OR_DEPARTMENT_OTHER)
Admission: RE | Admit: 2015-01-31 | Discharge: 2015-01-31 | Disposition: A | Discharge status: Home / Self Care | Payer: Self-pay | Source: Ambulatory Visit | Attending: Orthopaedic Surgery | Admitting: Orthopaedic Surgery

## 2015-01-31 ENCOUNTER — Encounter (HOSPITAL_BASED_OUTPATIENT_CLINIC_OR_DEPARTMENT_OTHER): Payer: Self-pay | Admitting: Certified Registered"

## 2015-01-31 DIAGNOSIS — I10 Essential (primary) hypertension: Secondary | ICD-10-CM | POA: Insufficient documentation

## 2015-01-31 DIAGNOSIS — S83242A Other tear of medial meniscus, current injury, left knee, initial encounter: Secondary | ICD-10-CM | POA: Insufficient documentation

## 2015-01-31 DIAGNOSIS — F17210 Nicotine dependence, cigarettes, uncomplicated: Secondary | ICD-10-CM | POA: Insufficient documentation

## 2015-01-31 DIAGNOSIS — M65862 Other synovitis and tenosynovitis, left lower leg: Secondary | ICD-10-CM | POA: Insufficient documentation

## 2015-01-31 HISTORY — PX: KNEE ARTHROSCOPY WITH MEDIAL MENISECTOMY: SHX5651

## 2015-01-31 HISTORY — PX: SYNOVECTOMY: SHX5180

## 2015-01-31 SURGERY — ARTHROSCOPY, KNEE, WITH MEDIAL MENISCECTOMY
Anesthesia: General | Site: Knee | Laterality: Left

## 2015-01-31 MED ORDER — PROPOFOL 10 MG/ML IV BOLUS
INTRAVENOUS | Status: DC | PRN
  Administered 2015-01-31: 150 mg via INTRAVENOUS

## 2015-01-31 MED ORDER — LIDOCAINE HCL (CARDIAC) 20 MG/ML IV SOLN
INTRAVENOUS | Status: DC | PRN
  Administered 2015-01-31: 60 mg via INTRAVENOUS

## 2015-01-31 MED ORDER — CEFAZOLIN SODIUM-DEXTROSE 2-3 GM-% IV SOLR
2.0000 g | INTRAVENOUS | Status: AC
  Administered 2015-01-31: 2 g via INTRAVENOUS

## 2015-01-31 MED ORDER — DEXAMETHASONE SODIUM PHOSPHATE 10 MG/ML IJ SOLN
INTRAMUSCULAR | Status: DC | PRN
  Administered 2015-01-31: 10 mg via INTRAVENOUS

## 2015-01-31 MED ORDER — HYDROMORPHONE HCL 1 MG/ML IJ SOLN
0.2500 mg | INTRAMUSCULAR | Status: DC | PRN
  Administered 2015-01-31 (×2): 0.5 mg via INTRAVENOUS

## 2015-01-31 MED ORDER — FENTANYL CITRATE (PF) 100 MCG/2ML IJ SOLN
50.0000 ug | INTRAMUSCULAR | Status: DC | PRN
  Administered 2015-01-31: 100 ug via INTRAVENOUS

## 2015-01-31 MED ORDER — BUPIVACAINE HCL (PF) 0.5 % IJ SOLN
INTRAMUSCULAR | Status: DC | PRN
  Administered 2015-01-31: 20 mL

## 2015-01-31 MED ORDER — ONDANSETRON HCL 4 MG PO TABS: 4.0000 mg | ORAL_TABLET | Freq: Three times a day (TID) | ORAL | Status: DC | PRN

## 2015-01-31 MED ORDER — FENTANYL CITRATE (PF) 100 MCG/2ML IJ SOLN
INTRAMUSCULAR | Status: AC
  Filled 2015-01-31: qty 2

## 2015-01-31 MED ORDER — HYDROMORPHONE HCL 1 MG/ML IJ SOLN
INTRAMUSCULAR | Status: AC
  Filled 2015-01-31: qty 1

## 2015-01-31 MED ORDER — HYDROCODONE-ACETAMINOPHEN 7.5-325 MG PO TABS: 1.0000 | ORAL_TABLET | Freq: Four times a day (QID) | ORAL | Status: DC | PRN

## 2015-01-31 MED ORDER — GLYCOPYRROLATE 0.2 MG/ML IJ SOLN: 0.2000 mg | Freq: Once | INTRAMUSCULAR | Status: DC | PRN

## 2015-01-31 MED ORDER — ONDANSETRON HCL 4 MG/2ML IJ SOLN
4.0000 mg | Freq: Once | INTRAMUSCULAR | Status: AC | PRN
  Administered 2015-01-31: 4 mg via INTRAVENOUS

## 2015-01-31 MED ORDER — SCOPOLAMINE 1 MG/3DAYS TD PT72: 1.0000 | MEDICATED_PATCH | Freq: Once | TRANSDERMAL | Status: DC

## 2015-01-31 MED ORDER — MIDAZOLAM HCL 2 MG/2ML IJ SOLN
1.0000 mg | INTRAMUSCULAR | Status: DC | PRN
  Administered 2015-01-31: 2 mg via INTRAVENOUS

## 2015-01-31 MED ORDER — SODIUM CHLORIDE 0.9 % IR SOLN
Status: DC | PRN
  Administered 2015-01-31: 3000 mL

## 2015-01-31 MED ORDER — MEPERIDINE HCL 25 MG/ML IJ SOLN: 6.2500 mg | INTRAMUSCULAR | Status: DC | PRN

## 2015-01-31 MED ORDER — LACTATED RINGERS IV SOLN
INTRAVENOUS | Status: DC
  Administered 2015-01-31: 10 mL/h via INTRAVENOUS
  Administered 2015-01-31: 12:00:00 via INTRAVENOUS

## 2015-01-31 MED ORDER — MIDAZOLAM HCL 2 MG/2ML IJ SOLN
INTRAMUSCULAR | Status: AC
  Filled 2015-01-31: qty 2

## 2015-01-31 MED FILL — HYDROCODON-APAP 7.5-325: 7.5-325 | 12 days supply | Qty: 90 | Fill #0

## 2015-01-31 SURGICAL SUPPLY — 39 items
BANDAGE ACE 6X5 VEL STRL LF (GAUZE/BANDAGES/DRESSINGS) ×6 IMPLANT
BANDAGE ESMARK 6X9 LF (GAUZE/BANDAGES/DRESSINGS) IMPLANT
BLADE 4.2CUDA (BLADE) ×3 IMPLANT
BLADE CUDA GRT WHITE 3.5 (BLADE) IMPLANT
BLADE CUDA SHAVER 3.5 (BLADE) IMPLANT
BLADE CUTTER GATOR 3.5 (BLADE) IMPLANT
BNDG ESMARK 6X9 LF (GAUZE/BANDAGES/DRESSINGS)
CUFF TOURNIQUET SINGLE 34IN LL (TOURNIQUET CUFF) ×3 IMPLANT
DRAPE ARTHROSCOPY W/POUCH 90 (DRAPES) ×3 IMPLANT
DRAPE SURG 17X23 STRL (DRAPES) ×6 IMPLANT
DURAPREP 26ML APPLICATOR (WOUND CARE) ×3 IMPLANT
ELECT MENISCUS 165MM 90D (ELECTRODE) IMPLANT
ELECT REM PT RETURN 9FT ADLT (ELECTROSURGICAL)
ELECTRODE REM PT RTRN 9FT ADLT (ELECTROSURGICAL) IMPLANT
GAUZE SPONGE 4X4 12PLY STRL (GAUZE/BANDAGES/DRESSINGS) ×3 IMPLANT
GAUZE XEROFORM 1X8 LF (GAUZE/BANDAGES/DRESSINGS) ×3 IMPLANT
GLOVE BIOGEL M STRL SZ7.5 (GLOVE) ×3 IMPLANT
GLOVE BIOGEL PI IND STRL 8 (GLOVE) ×2 IMPLANT
GLOVE BIOGEL PI INDICATOR 8 (GLOVE) ×4
GLOVE SKINSENSE NS SZ7.5 (GLOVE) ×2
GLOVE SKINSENSE STRL SZ7.5 (GLOVE) ×1 IMPLANT
GLOVE SURG SYN 7.5  E (GLOVE) ×2
GLOVE SURG SYN 7.5 E (GLOVE) ×1 IMPLANT
GOWN STRL REIN XL XLG (GOWN DISPOSABLE) ×3 IMPLANT
GOWN STRL REUS W/ TWL LRG LVL3 (GOWN DISPOSABLE) ×1 IMPLANT
GOWN STRL REUS W/TWL LRG LVL3 (GOWN DISPOSABLE) ×3
KNEE WRAP E Z 3 GEL PACK (MISCELLANEOUS) ×3 IMPLANT
MANIFOLD NEPTUNE II (INSTRUMENTS) ×3 IMPLANT
PACK ARTHROSCOPY DSU (CUSTOM PROCEDURE TRAY) ×3 IMPLANT
PACK BASIN DAY SURGERY FS (CUSTOM PROCEDURE TRAY) ×3 IMPLANT
PENCIL BUTTON HOLSTER BLD 10FT (ELECTRODE) IMPLANT
RESECTOR FULL RADIUS 4.2MM (BLADE) IMPLANT
SET ARTHROSCOPY TUBING (MISCELLANEOUS) ×3
SET ARTHROSCOPY TUBING LN (MISCELLANEOUS) ×1 IMPLANT
SLEEVE SCD COMPRESS KNEE MED (MISCELLANEOUS) IMPLANT
SUT ETHILON 3 0 PS 1 (SUTURE) ×3 IMPLANT
TOWEL OR 17X24 6PK STRL BLUE (TOWEL DISPOSABLE) ×3 IMPLANT
TOWEL OR NON WOVEN STRL DISP B (DISPOSABLE) IMPLANT
WATER STERILE IRR 1000ML POUR (IV SOLUTION) ×3 IMPLANT

## 2015-01-31 NOTE — Op Note (Signed)
   Surgery Date: 01/31/2015  Surgeon(s): Naiping Ephriam Jenkins, MD  ANESTHESIA:  general  FLUIDS: Per anesthesia record.   ESTIMATED BLOOD LOSS: minimal  PREOPERATIVE DIAGNOSES:  1. Left knee medial meniscus tear 2.  knee synovitis  POSTOPERATIVE DIAGNOSES:  same  PROCEDURES PERFORMED:  1. Left knee arthroscopy with limited synovectomy 2. knee arthroscopy with arthroscopic partial medial meniscectomy  DESCRIPTION OF PROCEDURE: Ms. Brittany Jimenez is a 50 y.o.-year-old female with left knee medial meniscus tear. Plans are to proceed with partial medial meniscectomy and diagnostic arthroscopy with debridement as indicated. Full discussion held regarding risks benefits alternatives and complications related surgical intervention. Conservative care options reviewed. All questions answered.  The patient was identified in the preoperative holding area and the operative extremity was marked. The patient was brought to the operating room and transferred to operating table in a supine position. Satisfactory general anesthesia was induced by anesthesiology.    Standard anterolateral, anteromedial arthroscopy portals were obtained. The anteromedial portal was obtained with a spinal needle for localization under direct visualization with subsequent diagnostic findings.   Anteromedial and anterolateral chambers: mild synovitis. The synovitis was debrided with a 4.5 mm full radius shaver through both the anteromedial and lateral portals.   Suprapatellar pouch and gutters: mild synovitis or debris. Patella chondral surface: Grade 0 Trochlear chondral surface: Grade 0 Patellofemoral tracking: normal Medial meniscus: small peripheral tear.  Medial femoral condyle flexion bearing surface: Grade 0 Medial femoral condyle extension bearing surface: Grade 0 Medial tibial plateau: Grade 0 Anterior cruciate ligament:stable Posterior cruciate ligament:stable Lateral meniscus: normal.   Lateral femoral condyle  flexion bearing surface: Grade 0 Lateral femoral condyle extension bearing surface: Grade 0 Lateral tibial plateau: Grade 0  After completion of synovectomy, diagnostic exam, and debridements as described, all compartments were checked and no residual debris remained. Hemostasis was achieved with the cautery wand. The portals were approximated with interrupted nylon suture. All excess fluid was expressed from the joint. The portals were approximated with interrupted nylon suture. Xeroform sterile gauze dressings were applied followed by Ace bandage and ice pack.   DISPOSITION: The patient was awakened from general anesthetic, extubated, taken to the recovery room in medically stable condition, no apparent complications. The patient may be weightbearing as tolerated to the operative lower extremity.  Range of motion of right knee as tolerated.  Azucena Cecil, MD [place] 11:20 AM

## 2015-01-31 NOTE — Anesthesia Procedure Notes (Signed)
Procedure Name: LMA Insertion Date/Time: 01/31/2015 10:49 AM Performed by: Avelyn Touch D Pre-anesthesia Checklist: Patient identified, Emergency Drugs available, Suction available and Patient being monitored Patient Re-evaluated:Patient Re-evaluated prior to inductionOxygen Delivery Method: Circle System Utilized Preoxygenation: Pre-oxygenation with 100% oxygen Intubation Type: IV induction Ventilation: Mask ventilation without difficulty LMA: LMA inserted LMA Size: 4.0 Number of attempts: 1 Airway Equipment and Method: Bite block Placement Confirmation: positive ETCO2 Tube secured with: Tape Dental Injury: Teeth and Oropharynx as per pre-operative assessment

## 2015-01-31 NOTE — Anesthesia Postprocedure Evaluation (Signed)
Anesthesia Post Note  Patient: Geselle Giasson  Procedure(s) Performed: Procedure(s) (LRB): LEFT KNEE ARTHROSCOPY WITH PARTIAL MEDIAL MENISCECTOMY (Left) SYNOVECTOMY (Left)  Patient location during evaluation: PACU Anesthesia Type: General Level of consciousness: awake and alert Pain management: pain level controlled Vital Signs Assessment: post-procedure vital signs reviewed and stable Respiratory status: spontaneous breathing, nonlabored ventilation, respiratory function stable and patient connected to nasal cannula oxygen Cardiovascular status: blood pressure returned to baseline and stable Postop Assessment: no signs of nausea or vomiting Anesthetic complications: no    Last Vitals:  Filed Vitals:   01/31/15 1130 01/31/15 1145  BP: 137/84 144/75  Pulse: 72 64  Temp:    Resp: 15 15    Last Pain:  Filed Vitals:   01/31/15 1151  PainSc: 0-No pain    LLE Motor Response: Purposeful movement (01/31/15 1145) LLE Sensation: Full sensation (01/31/15 1145)          Brittany Jimenez DAVID

## 2015-01-31 NOTE — Anesthesia Preprocedure Evaluation (Addendum)
Anesthesia Evaluation  Patient identified by MRN, date of birth, ID band Patient awake    Reviewed: Allergy & Precautions, NPO status , Patient's Chart, lab work & pertinent test results  Airway Mallampati: I  TM Distance: >3 FB Neck ROM: Full    Dental   Pulmonary asthma , Current Smoker,    Pulmonary exam normal        Cardiovascular hypertension, Pt. on medications Normal cardiovascular exam     Neuro/Psych PSYCHIATRIC DISORDERS Anxiety Depression    GI/Hepatic   Endo/Other    Renal/GU      Musculoskeletal   Abdominal   Peds  Hematology   Anesthesia Other Findings   Reproductive/Obstetrics                            Anesthesia Physical Anesthesia Plan  ASA: II  Anesthesia Plan: General   Post-op Pain Management:    Induction: Intravenous  Airway Management Planned: LMA  Additional Equipment:   Intra-op Plan:   Post-operative Plan: Extubation in OR  Informed Consent: I have reviewed the patients History and Physical, chart, labs and discussed the procedure including the risks, benefits and alternatives for the proposed anesthesia with the patient or authorized representative who has indicated his/her understanding and acceptance.     Plan Discussed with: CRNA and Surgeon  Anesthesia Plan Comments:         Anesthesia Quick Evaluation

## 2015-01-31 NOTE — Transfer of Care (Signed)
Immediate Anesthesia Transfer of Care Note  Patient: Brittany Jimenez  Procedure(s) Performed: Procedure(s): LEFT KNEE ARTHROSCOPY WITH PARTIAL MEDIAL MENISCECTOMY (Left) SYNOVECTOMY (Left)  Patient Location: PACU  Anesthesia Type:General  Level of Consciousness: awake, alert , oriented and patient cooperative  Airway & Oxygen Therapy: Patient Spontanous Breathing and Patient connected to face mask oxygen  Post-op Assessment: Report given to RN and Post -op Vital signs reviewed and stable  Post vital signs: Reviewed and stable  Last Vitals:  Filed Vitals:   01/31/15 0851  BP: 133/95  Pulse: 81  Temp: 36.8 C  Resp: 18    Complications: No apparent anesthesia complications

## 2015-01-31 NOTE — Discharge Instructions (Signed)
° ° °Post-operative patient instructions  °Knee Arthroscopy  ° °• Ice:  Place intermittent ice or cooler pack over your knee, 30 minutes on and 30 minutes off.  Continue this for the first 72 hours after surgery, then save ice for use after therapy sessions or on more active days.   °• Weight:  You may bear weight on your leg as your symptoms allow. °• Crutches:  Use crutches (or walker) to assist in walking until told to discontinue by your physical therapist or physician. This will help to reduce pain. °• Strengthening:  Perform simple thigh squeezes (isometric quad contractions) and straight leg lifts as you are able (3 sets of 5 to 10 repetitions, 3 times a day).  For the leg lifts, have someone support under your ankle in the beginning until you have increased strength enough to do this on your own.  To help get started on thigh squeezes, place a pillow under your knee and push down on the pillow with back of knee (sometimes easier to do than with your leg fully straight). °• Motion:  Perform gentle knee motion as tolerated - this is gentle bending and straightening of the knee. Seated heel slides: you can start by sitting in a chair, remove your brace, and gently slide your heel back on the floor - allowing your knee to bend. Have someone help you straighten your knee (or use your other leg/foot hooked under your ankle.  °• Dressing:  Perform 1st dressing change at 2 days postoperative. A moderate amount of blood tinged drainage is to be expected.  So if you bleed through the dressing on the first or second day or if you have fevers, it is fine to change the dressing/check the wounds early and redress wound. Elevate your leg.  If it bleeds through again, or if the incisions are leaking frank blood, please call the office. May change dressing every 1-2 days thereafter to help watch wounds. Can purchase Tegderm (or 3M Nexcare) water resistant dressings at local pharmacy / [employer]. °• Shower:  Light shower is ok  after 2 days.  Please take shower, NO bath. Recover with gauze and ace wrap to help keep wounds protected.   °• Pain medication:  A narcotic pain medication has been prescribed.  Take as directed.  Typically you need narcotic pain medication more regularly during the first 3 to 5 days after surgery.  Decrease your use of the medication as the pain improves.  Narcotics can sometimes cause constipation, even after a few doses.  If you have problems with constipation, you can take an over the counter stool softener or light laxative.  If you have persistent problems, please notify your physician’s office. °• Physical therapy: Additional activity guidelines to be provided by your physician or physical therapist at follow-up visits.  °• Driving: Do not recommend driving x 2 weeks post surgical, especially if surgery performed on right side. Should not drive while taking narcotic pain medications. It typically takes at least 2 weeks to restore sufficient neuromuscular function for normal reaction times for driving safety.  °• Call [phone] for questions or problems. Evenings you will be forwarded to the hospital operator.  Ask for the orthopaedic physician on call. Please call if you experience:  °  °o Redness, foul smelling, or persistent drainage from the surgical site  °o worsening knee pain and swelling not responsive to medication  °o any calf pain and or swelling of the lower leg  °o temperatures greater than   o other questions or concerns ° ° °Thank you for allowing us to be a part of your care. ° °Post Anesthesia Home Care Instructions ° °Activity: °Get plenty of rest for the remainder of the day. A responsible adult should stay with you for 24 hours following the procedure.  °For the next 24 hours, DO NOT: °-Drive a car °-Operate machinery °-Drink alcoholic beverages °-Take any medication unless instructed by your physician °-Make any legal decisions or sign important papers. ° °Meals: °Start with liquid  foods such as gelatin or soup. Progress to regular foods as tolerated. Avoid greasy, spicy, heavy foods. If nausea and/or vomiting occur, drink only clear liquids until the nausea and/or vomiting subsides. Call your physician if vomiting continues. ° °Special Instructions/Symptoms: °Your throat may feel dry or sore from the anesthesia or the breathing tube placed in your throat during surgery. If this causes discomfort, gargle with warm salt water. The discomfort should disappear within 24 hours. ° °If you had a scopolamine patch placed behind your ear for the management of post- operative nausea and/or vomiting: ° °1. The medication in the patch is effective for 72 hours, after which it should be removed.  Wrap patch in a tissue and discard in the trash. Wash hands thoroughly with soap and water. °2. You may remove the patch earlier than 72 hours if you experience unpleasant side effects which may include dry mouth, dizziness or visual disturbances. °3. Avoid touching the patch. Wash your hands with soap and water after contact with the patch. °  ° °

## 2015-01-31 NOTE — H&P (Signed)
    PREOPERATIVE H&P  Chief Complaint: Left knee medial meniscal tear  HPI: Brittany Jimenez is a 50 y.o. female who presents for surgical treatment of Left knee medial meniscal tear.  She denies any changes in medical history.  Past Medical History  Diagnosis Date  . Obesity   . Chronic pain   . Depression   . Arthritis   . Asthma   . Hypertension   . Anxiety    Past Surgical History  Procedure Laterality Date  . Appendectomy    . Tubal ligation  1995   Social History   Social History  . Marital Status: Married    Spouse Name: N/A  . Number of Children: N/A  . Years of Education: N/A   Social History Main Topics  . Smoking status: Current Every Day Smoker -- 0.25 packs/day    Types: Cigarettes  . Smokeless tobacco: None  . Alcohol Use: No  . Drug Use: No  . Sexual Activity: Yes    Birth Control/ Protection: Surgical   Other Topics Concern  . None   Social History Narrative   Lives in [place] with husband, nephew, and one daughter.  Applying for disability currently.  Used to work as a Educational psychologist at [employer].   Family History  Problem Relation Age of Onset  . Heart disease Mother   . Early death Mother   . Asthma Mother   . Diabetes Mother   . Hypertension Mother   . Cancer Father   . Alcohol abuse Father   . Early death Father   . Hypertension Father   . Asthma Sister   . Cancer Sister   . Hypertension Sister   . Depression Brother   . Drug abuse Brother   . Hypertension Brother    No Known Allergies Prior to Admission medications   Medication Sig Start Date End Date Taking? Authorizing Provider  albuterol (PROVENTIL HFA;VENTOLIN HFA) 108 (90 BASE) MCG/ACT inhaler Inhale 2 puffs into the lungs every 6 (six) hours as needed. For shortness of breath 12/08/12  Yes Olugbemiga E Doreene Burke, MD  busPIRone (BUSPAR) 5 MG tablet Take 1 tablet (5 mg total) by mouth 2 (two) times daily. 01/23/15  Yes Josalyn Funches, MD  hydrochlorothiazide (HYDRODIURIL)  25 MG tablet Take 1 tablet (25 mg total) by mouth daily. 01/23/15  Yes Josalyn Funches, MD     Positive ROS: All other systems have been reviewed and were otherwise negative with the exception of those mentioned in the HPI and as above.  Physical Exam: General: Alert, no acute distress Cardiovascular: No pedal edema Respiratory: No cyanosis, no use of accessory musculature GI: abdomen soft Skin: No lesions in the area of chief complaint Neurologic: Sensation intact distally Psychiatric: Patient is competent for consent with normal mood and affect Lymphatic: no lymphedema  MUSCULOSKELETAL: exam stable  Assessment: Left knee medial meniscal tear  Plan: Plan for Procedure(s): LEFT KNEE ARTHROSCOPY WITH PARTIAL MEDIAL MENISCECTOMY  The risks benefits and alternatives were discussed with the patient including but not limited to the risks of nonoperative treatment, versus surgical intervention including infection, bleeding, nerve injury,  blood clots, cardiopulmonary complications, morbidity, mortality, among others, and they were willing to proceed.   Marianna Payment, MD   01/31/2015 8:02 AM

## 2015-02-01 MED FILL — ?ONDANSETRON HCL 4 MG TABLE: 4 | 7 days supply | Qty: 40 | Fill #0

## 2015-02-08 ENCOUNTER — Ambulatory Visit (INDEPENDENT_AMBULATORY_CARE_PROVIDER_SITE_OTHER): Payer: Medicare Other | Admitting: Neurology

## 2015-02-08 ENCOUNTER — Encounter: Payer: Self-pay | Admitting: Neurology

## 2015-02-08 VITALS — BP 120/74 | HR 83 | Ht 63.0 in | Wt 175.1 lb

## 2015-02-08 DIAGNOSIS — R202 Paresthesia of skin: Secondary | ICD-10-CM

## 2015-02-08 DIAGNOSIS — F17200 Nicotine dependence, unspecified, uncomplicated: Secondary | ICD-10-CM

## 2015-02-08 DIAGNOSIS — G8311 Monoplegia of lower limb affecting right dominant side: Secondary | ICD-10-CM

## 2015-02-08 DIAGNOSIS — H53462 Homonymous bilateral field defects, left side: Secondary | ICD-10-CM

## 2015-02-08 MED ORDER — DIAZEPAM 5 MG PO TABS: ORAL_TABLET | ORAL | Status: DC

## 2015-02-08 NOTE — Patient Instructions (Addendum)
1.  MRI and MRA brain without contrast, US carotid 2.  NCS/EMG of the right leg 3.  Recommend starting aspirin 81mg  daily 4.  We will call you with the results 4.  Return to clinic in 6 - 8 weeks

## 2015-02-08 NOTE — Progress Notes (Signed)
[facility] Clinic Note - Initial Visit   Date: 02/08/2015  Brittany Jimenez MRN: [id number] DOB: 01-13-1966   Dear Dr. Adrian Blackwater:  Thank you for your kind referral of Brittany Jimenez for consultation of right foot numbness. Although her history is well known to you, please allow [place] to reiterate it for the purpose of our medical record. The patient was accompanied to the clinic by self.    History of Present Illness: Brittany Jimenez is a 50 y.o. right-handed African American female with depression, hypertension, chronic left knee pain, and asthma presenting for evaluation of right foot numbness.    Starting around October 2016, she began experiencing numbness over the tips of toes on the right. Numbness and tingling is constant and does not involve the sole or dorsum of the foot. She does not have any weakness or similar symptoms on the left foot. Nothing triggers, exacerbates, or alleviates her foot discomfort.  Her PCP had started her on gabapentin 300mg , but she did not take it because not tolerating this medication in the past.  She endorses low back pain.   She had knee surgery on January 18th for left meniscal tear.  She now feels that because of compensating on her right side, her ankles has started to pop and give-out.   Upon further questioning after her exam, she reports having left visual field cut for several years.  She does not recall when it occurred.  No personal history of stroke that she is aware of.  No prior MRI brain.  She has a family history which is significant for stroke and heart disease.  She does not take daily aspirin.   Out-side paper records, electronic medical record, and images have been reviewed where available and summarized as:  Lab Results  Component Value Date   TSH 0.523 12/06/2014   Lab Results  Component Value Date   HGBA1C 5.30 12/06/2014   Lab Results  Component Value Date   VITAMINB12 441 12/06/2014    MRI right ankle 12/15/2012: 1. Chronic tear of the anterior talofibular ligament. 2. Mild peroneus longus tenosynovitis. The peroneal tendons are otherwise intact. 3. Small area of partial thickness cartilage loss with subchondral reactive changes involving the medial corner of the tibial plafond.   Past Medical History  Diagnosis Date  . Obesity   . Chronic pain   . Depression   . Arthritis   . Asthma   . Hypertension   . Anxiety     Past Surgical History  Procedure Laterality Date  . Appendectomy    . Tubal ligation  1995  . Knee arthroscopy with medial menisectomy Left 01/31/2015    Procedure: LEFT KNEE ARTHROSCOPY WITH PARTIAL MEDIAL MENISCECTOMY;  Surgeon: Leandrew Koyanagi, MD;  Location: [facility];  Service: Orthopedics;  Laterality: Left;  . Synovectomy Left 01/31/2015    Procedure: SYNOVECTOMY;  Surgeon: Leandrew Koyanagi, MD;  Location: [facility];  Service: Orthopedics;  Laterality: Left;     Medications:  Outpatient Encounter Prescriptions as of 02/08/2015  Medication Sig  . albuterol (PROVENTIL HFA;VENTOLIN HFA) 108 (90 BASE) MCG/ACT inhaler Inhale 2 puffs into the lungs every 6 (six) hours as needed. For shortness of breath  . busPIRone (BUSPAR) 5 MG tablet Take 1 tablet (5 mg total) by mouth 2 (two) times daily.  . hydrochlorothiazide (HYDRODIURIL) 25 MG tablet Take 1 tablet (25 mg total) by mouth daily.  Marland Kitchen HYDROcodone-acetaminophen (NORCO) 7.5-325 MG tablet Take 1-2 tablets by mouth  every 6 (six) hours as needed for moderate pain.  Marland Kitchen ondansetron (ZOFRAN) 4 MG tablet Take 1-2 tablets (4-8 mg total) by mouth every 8 (eight) hours as needed for nausea or vomiting.  . diazepam (VALIUM) 5 MG tablet Take 1 tablet 30-min prior to MRI, okay to repeat again at facility if needed.  . [DISCONTINUED] busPIRone (BUSPAR) 5 MG tablet Take 1 tablet (5 mg total) by mouth 2 (two) times daily.  . [DISCONTINUED] diazepam (VALIUM) 5 MG tablet Take one hour  before your MRI appt (Patient not taking: Reported on 01/23/2015)  . [DISCONTINUED] gabapentin (NEURONTIN) 300 MG capsule Take 1 capsule (300 mg total) by mouth at bedtime.  . [DISCONTINUED] hydrochlorothiazide (HYDRODIURIL) 25 MG tablet Take 1 tablet (25 mg total) by mouth daily.   No facility-administered encounter medications on file as of 02/08/2015.     Allergies: No Known Allergies  Family History: Family History  Problem Relation Age of Onset  . Heart disease Mother   . Early death Mother   . Asthma Mother   . Diabetes Mother   . Hypertension Mother   . Cancer Father   . Alcohol abuse Father   . Early death Father   . Hypertension Father   . Asthma Sister   . Cancer Sister   . Hypertension Sister   . Depression Brother   . Drug abuse Brother   . Hypertension Brother     Social History: Social History  Substance Use Topics  . Smoking status: Current Every Day Smoker -- 0.25 packs/day for 35 years    Types: Cigarettes  . Smokeless tobacco: Never Used  . Alcohol Use: No   Social History   Social History Narrative   Lives in [place] with husband, nephew, and one daughter.  Applying for disability currently for depression and knee pain.     Used to work as a Educational psychologist at [employer].    Review of Systems:  CONSTITUTIONAL: No fevers, chills, night sweats, or weight loss.   EYES: +visual changes or eye pain ENT: No hearing changes.  No history of nose bleeds.   RESPIRATORY: No cough, wheezing and shortness of breath.   CARDIOVASCULAR: Negative for chest pain, and palpitations.   GI: Negative for abdominal discomfort, blood in stools or black stools.  No recent change in bowel habits.   GU:  No history of incontinence.   MUSCLOSKELETAL: No history of joint pain or swelling.  No myalgias.   SKIN: Negative for lesions, rash, and itching.   HEMATOLOGY/ONCOLOGY: Negative for prolonged bleeding, bruising easily, and swollen nodes.  No history of cancer.   ENDOCRINE:  Negative for cold or heat intolerance, polydipsia or goiter.   PSYCH:  +depression or anxiety symptoms.   NEURO: As Above.   Vital Signs:  BP 120/74 mmHg  Pulse 83  Ht 5\' 3"  (1.6 m)  Wt 175 lb 2 oz (79.436 kg)  BMI 31.03 kg/m2  SpO2 99%  LMP 01/29/2013   General Medical Exam:   General:  Well appearing, comfortable.   Eyes/ENT: see cranial nerve examination.   Neck: No masses appreciated.  Full range of motion without tenderness.  No carotid bruits. Respiratory:  Clear to auscultation, good air entry bilaterally.   Cardiac:  Regular rate and rhythm, no murmur.   Extremities:  Left knee is wrapped due to recent surgery.  No edema, or skin discoloration.  Skin:  No rashes or lesions.  Neurological Exam: MENTAL STATUS including orientation to time, place, person, recent  and remote memory, attention span and concentration, language, and fund of knowledge is normal.  Speech is not dysarthric.  CRANIAL NERVES: II:  Left homonymous hemianopia, right visual field intact.  Unremarkable fundi.   III-IV-VI: Pupils equal round and reactive to light.  Normal conjugate, extra-ocular eye movements in all directions of gaze.  No nystagmus.  Mild left ptosis.   V:  Normal facial sensation.   VII:  Normal facial symmetry and movements.  No pathologic facial reflexes.  VIII:  Normal hearing and vestibular function.   IX-X:  Normal palatal movement.   XI:  Normal shoulder shrug and head rotation.   XII:  Normal tongue strength and range of motion, no deviation or fasciculation.  MOTOR:  No atrophy, fasciculations or abnormal movements.  No pronator drift.  Tone is normal.    Right Upper Extremity:    Left Upper Extremity:    Deltoid  5/5   Deltoid  5/5   Biceps  5/5   Biceps  5/5   Triceps  5/5   Triceps  5/5   Wrist extensors  5/5   Wrist extensors  5/5   Wrist flexors  5/5   Wrist flexors  5/5   Finger extensors  5/5   Finger extensors  5/5   Finger flexors  5/5   Finger flexors  5/5     Dorsal interossei  5/5   Dorsal interossei  5/5   Abductor pollicis  5/5   Abductor pollicis  5/5   Tone (Ashworth scale)  0  Tone (Ashworth scale)  0   Right Lower Extremity:    Left Lower Extremity:    Hip flexors  5/5   Hip flexors  5/5   Hip extensors  5/5   Hip extensors  5/5   Knee flexors  5/5   Knee flexors  5/5   Knee extensors  5/5   Knee extensors  5/5   Inversoin 4/5  Inversion 5/5  Eversion 4/5  Eversion 5/5  Dorsiflexors  4+/5   Dorsiflexors  5/5   Plantarflexors  4/5   Plantarflexors  5/5   Toe extensors  5-/5   Toe extensors  5/5   Toe flexors  4/5   Toe flexors  5/5   Tone (Ashworth scale)  0  Tone (Ashworth scale)  0   MSRs:  Right                                                                 Left brachioradialis 2+  brachioradialis 2+  biceps 2+  biceps 2+  triceps 2+  triceps 2+  patellar 3+  patellar 3+  ankle jerk 1+  ankle jerk 1+  Hoffman no  Hoffman no  plantar response down  plantar response down   SENSORY:  Absent vibration at the right great toe, temperature and pin prick reduced over the tips of the toes on the right.  Sensation to all modalities intact in the upper extremities and left foot.  COORDINATION/GAIT: Normal finger-to- nose-finger.  Intact rapid alternating movements bilaterally.  Unable to rise from a chair without using arms.  Gait is analgic and right toe remains extended.     IMPRESSION: 1.  Right foot weakness and paresthesias  - Distal paresthesias may certainly  be initial presentation of neuropathy but she has no history of diabetes or alcohol use  - No history of radicular pain makes lumbar radiculopathy less likely  - Exam is difficult to localize as she has weakness in the entire right foot ?sciatic mononeuropathy, but sensory loss would be expected to involve a greater area.  - NCS/EMG of the right > left leg to help localize symptoms  2.  Left homonymous hemianopia, chronic with no prior work-up  - MRI brain wo contrast  to look for remote stroke  - MRA head and US carotids  - Start aspirin 81mg  daily  - If there is ischemic changes and/or atherosclerosis, she will need secondary risk modification, check lipid profile   3.  Tobacco use  - Smoking cessation instruction/counseling given:  counseled patient on the dangers of tobacco use, advised patient to stop smoking, and reviewed strategies to maximize success   Return to clinic in 6 weeks, or sooner as needed.   The duration of this appointment visit was 60 minutes of face-to-face time with the patient.  Greater than 50% of this time was spent in counseling, explanation of diagnosis, planning of further management, and coordination of care.   Thank you for allowing me to participate in patient's care.  If I can answer any additional questions, I would be pleased to do so.    Sincerely,    Sostenes Kauffmann K. Posey Pronto, DO

## 2015-02-09 ENCOUNTER — Other Ambulatory Visit: Payer: Self-pay | Admitting: Neurology

## 2015-02-09 DIAGNOSIS — H53462 Homonymous bilateral field defects, left side: Secondary | ICD-10-CM

## 2015-02-13 ENCOUNTER — Encounter: Payer: Medicare Other | Admitting: Neurology

## 2015-02-14 ENCOUNTER — Telehealth: Payer: Self-pay | Admitting: Neurology

## 2015-02-14 ENCOUNTER — Other Ambulatory Visit: Payer: Self-pay | Admitting: *Deleted

## 2015-02-14 DIAGNOSIS — H53462 Homonymous bilateral field defects, left side: Secondary | ICD-10-CM

## 2015-02-14 DIAGNOSIS — R202 Paresthesia of skin: Secondary | ICD-10-CM

## 2015-02-14 DIAGNOSIS — G8311 Monoplegia of lower limb affecting right dominant side: Secondary | ICD-10-CM

## 2015-02-14 NOTE — Telephone Encounter (Signed)
PT called in regards to her MRI today and needs a call back at [phone]

## 2015-02-14 NOTE — Telephone Encounter (Signed)
Patient called.  Informed he that I ordered a CT and a CTA per Dr. Posey Pronto.  Gave her the number to [facility] to set up the appointment.

## 2015-02-14 NOTE — Telephone Encounter (Signed)
Called patient back and informed her that her referral says that her tests are authorized.

## 2015-02-14 NOTE — Telephone Encounter (Signed)
PT called and would like a call back at [phone]/Dawn

## 2015-02-15 ENCOUNTER — Ambulatory Visit (HOSPITAL_COMMUNITY)
Admission: RE | Admit: 2015-02-15 | Discharge: 2015-02-15 | Disposition: A | Discharge status: Home / Self Care | Payer: Medicare Other | Source: Ambulatory Visit | Attending: Neurology | Admitting: Neurology

## 2015-02-15 DIAGNOSIS — H53462 Homonymous bilateral field defects, left side: Secondary | ICD-10-CM

## 2015-02-15 DIAGNOSIS — I6523 Occlusion and stenosis of bilateral carotid arteries: Secondary | ICD-10-CM | POA: Insufficient documentation

## 2015-02-15 DIAGNOSIS — I10 Essential (primary) hypertension: Secondary | ICD-10-CM | POA: Insufficient documentation

## 2015-02-15 MED FILL — HYDROCODON-APAP 5-325: 5-325 | 8 days supply | Qty: 30 | Fill #0

## 2015-02-16 ENCOUNTER — Encounter: Payer: Self-pay | Admitting: Obstetrics and Gynecology

## 2015-02-19 ENCOUNTER — Ambulatory Visit: Payer: Self-pay | Attending: Family Medicine | Admitting: Family Medicine

## 2015-02-19 ENCOUNTER — Inpatient Hospital Stay: Admission: RE | Admit: 2015-02-19 | Payer: Medicare Other | Source: Ambulatory Visit

## 2015-02-19 ENCOUNTER — Encounter: Payer: Self-pay | Admitting: Family Medicine

## 2015-02-19 ENCOUNTER — Ambulatory Visit
Admission: RE | Admit: 2015-02-19 | Discharge: 2015-02-19 | Disposition: A | Discharge status: Home / Self Care | Payer: No Typology Code available for payment source | Source: Ambulatory Visit | Attending: Neurology | Admitting: Neurology

## 2015-02-19 VITALS — BP 132/84 | HR 61 | Temp 98.2°F | Resp 16 | Ht 63.0 in | Wt 170.0 lb

## 2015-02-19 DIAGNOSIS — Z79899 Other long term (current) drug therapy: Secondary | ICD-10-CM | POA: Insufficient documentation

## 2015-02-19 DIAGNOSIS — R202 Paresthesia of skin: Secondary | ICD-10-CM

## 2015-02-19 DIAGNOSIS — Z8673 Personal history of transient ischemic attack (TIA), and cerebral infarction without residual deficits: Secondary | ICD-10-CM

## 2015-02-19 DIAGNOSIS — J343 Hypertrophy of nasal turbinates: Secondary | ICD-10-CM

## 2015-02-19 DIAGNOSIS — I10 Essential (primary) hypertension: Secondary | ICD-10-CM

## 2015-02-19 DIAGNOSIS — D329 Benign neoplasm of meninges, unspecified: Secondary | ICD-10-CM | POA: Diagnosis not present

## 2015-02-19 DIAGNOSIS — G4700 Insomnia, unspecified: Secondary | ICD-10-CM

## 2015-02-19 DIAGNOSIS — Z9889 Other specified postprocedural states: Secondary | ICD-10-CM | POA: Insufficient documentation

## 2015-02-19 DIAGNOSIS — G8311 Monoplegia of lower limb affecting right dominant side: Secondary | ICD-10-CM

## 2015-02-19 DIAGNOSIS — F17210 Nicotine dependence, cigarettes, uncomplicated: Secondary | ICD-10-CM | POA: Insufficient documentation

## 2015-02-19 DIAGNOSIS — H53462 Homonymous bilateral field defects, left side: Secondary | ICD-10-CM

## 2015-02-19 MED ORDER — ASPIRIN 81 MG PO TABS: 81.0000 mg | ORAL_TABLET | Freq: Every day | ORAL | Status: DC

## 2015-02-19 MED ORDER — TRAZODONE HCL 50 MG PO TABS: 25.0000 mg | ORAL_TABLET | Freq: Every evening | ORAL | Status: DC | PRN

## 2015-02-19 MED ORDER — ZOLPIDEM TARTRATE 5 MG PO TABS: 5.0000 mg | ORAL_TABLET | Freq: Every evening | ORAL | Status: DC | PRN

## 2015-02-19 MED ORDER — IOPAMIDOL (ISOVUE-370) INJECTION 76%
100.0000 mL | Freq: Once | INTRAVENOUS | Status: AC | PRN
  Administered 2015-02-19: 100 mL via INTRAVENOUS

## 2015-02-19 MED FILL — traZODone HCL 50 MG TABS: 50 | 30 days supply | Qty: 30 | Fill #0

## 2015-02-19 NOTE — Assessment & Plan Note (Signed)
A: Blood pressure at goal. Meds: compliant  P: I will continue the patient's current medication regimen since her blood pressure is at goal.   

## 2015-02-19 NOTE — Assessment & Plan Note (Signed)
A; insomnia with poor sleep hygiene and worry about recent health changes (surgery of knee, remote stroke)  P: Improve sleep hygiene Trazodone 25-50 mg nightly ambien 5 mg prn, short term

## 2015-02-19 NOTE — Progress Notes (Signed)
C/C unable to sleep  No pain today  Tobacco user 4 cigarette per day  No suicidal thoughts in the past two weeks

## 2015-02-19 NOTE — Assessment & Plan Note (Signed)
A; remote stroke with + findings on CT angio head P: ASA 81 mg daily Check lipids Patient had negative recent carotid dopplers continue BP control Smoking cessation

## 2015-02-19 NOTE — Progress Notes (Signed)
Subjective:  Patient ID: Brittany Jimenez, female    DOB: [birth year]  Age: 50 y.o. MRN: [id number]  CC: Insomnia   HPI Brittany Jimenez presents for    1. CHRONIC HYPERTENSION she smoke 3 cigs per day   Disease Monitoring  Blood pressure range: not checking   Chest pain: no   Dyspnea: no   Claudication: no   Medication compliance: yes  Medication Side Effects  Lightheadedness: no   Urinary frequency: no   Edema: no   2. Insomnia: she is working and is having trouble falling asleep. This has been going on for about 6 months. Falls asleep at 3/4 AM and gets up at 9 AM. She does snore per her husband. Has not tried any OTC sleep aids. She does sometimes smoke before bed. There is TV in  Bedroom. She watches TV until she falls asleep.   3. Remote strokes: patient is currently being evaluated by neurology. She endorsed decreased vision in L visual field upon initial evaluation of foot numbness. She has undergone CT angio head today which revealed remote infarcts in multiple areas. She has no known history of stroke.    IMPRESSION: No major vessel occlusion or correctable proximal stenosis.   Old cortical and subcortical infarctions in the right parieto-occipital region, left frontal region, left insula, and left parietal region. No sign of acute infarction.  13 mm meningioma at the anterior middle cranial fossa on the right without significant mass effect upon the brain.   Past Surgical History  Procedure Laterality Date  . Appendectomy    . Tubal ligation  1995  . Knee arthroscopy with medial menisectomy Left 01/31/2015    Procedure: LEFT KNEE ARTHROSCOPY WITH PARTIAL MEDIAL MENISCECTOMY;  Surgeon: Leandrew Koyanagi, MD;  Location: [facility];  Service: Orthopedics;  Laterality: Left;  . Synovectomy Left 01/31/2015    Procedure: SYNOVECTOMY;  Surgeon: Leandrew Koyanagi, MD;  Location: [facility];  Service: Orthopedics;  Laterality: Left;     Social History  Substance Use Topics  . Smoking status: Current Every Day Smoker -- 0.25 packs/day for 35 years    Types: Cigarettes  . Smokeless tobacco: Never Used  . Alcohol Use: No    Outpatient Prescriptions Prior to Visit  Medication Sig Dispense Refill  . albuterol (PROVENTIL HFA;VENTOLIN HFA) 108 (90 BASE) MCG/ACT inhaler Inhale 2 puffs into the lungs every 6 (six) hours as needed. For shortness of breath 1 Inhaler 1 year  . busPIRone (BUSPAR) 5 MG tablet Take 1 tablet (5 mg total) by mouth 2 (two) times daily. 60 tablet 5  . hydrochlorothiazide (HYDRODIURIL) 25 MG tablet Take 1 tablet (25 mg total) by mouth daily. 30 tablet 5  . HYDROcodone-acetaminophen (NORCO) 7.5-325 MG tablet Take 1-2 tablets by mouth every 6 (six) hours as needed for moderate pain. 90 tablet 0  . ondansetron (ZOFRAN) 4 MG tablet Take 1-2 tablets (4-8 mg total) by mouth every 8 (eight) hours as needed for nausea or vomiting. 40 tablet 0  . diazepam (VALIUM) 5 MG tablet Take 1 tablet 30-min prior to MRI, okay to repeat again at facility if needed. (Patient not taking: Reported on 02/19/2015) 2 tablet 0   No facility-administered medications prior to visit.    ROS Review of Systems  Constitutional: Negative for fever and chills.  Eyes: Negative for visual disturbance.  Respiratory: Negative for shortness of breath.   Cardiovascular: Negative for chest pain.  Gastrointestinal: Negative for abdominal pain and blood in  stool.  Musculoskeletal: Positive for arthralgias. Negative for back pain.  Skin: Negative for rash.  Allergic/Immunologic: Negative for immunocompromised state.  Hematological: Negative for adenopathy. Does not bruise/bleed easily.  Psychiatric/Behavioral: Positive for sleep disturbance. Negative for suicidal ideas and dysphoric mood.    Objective:  BP 132/84 mmHg  Pulse 61  Temp(Src) 98.2 F (36.8 C) (Oral)  Resp 16  Ht 5\' 3"  (1.6 m)  Wt 170 lb (77.111 kg)  BMI 30.12 kg/m2  SpO2  100%  LMP 01/29/2013  BP/Weight 02/19/2015 02/08/2015 99991111  Systolic BP [id number]  Diastolic BP 84 74 74  Wt. (Lbs) 170 175.13 169  BMI 30.12 31.03 29.94  Some encounter information is confidential and restricted. Go to Review Flowsheets activity to see all data.    Physical Exam  Constitutional: She is oriented to person, place, and time. She appears well-developed and well-nourished. No distress.  HENT:  Head: Normocephalic and atraumatic.  Nose: Mucosal edema present.  Cardiovascular: Normal rate, regular rhythm, normal heart sounds and intact distal pulses.   Pulmonary/Chest: Effort normal and breath sounds normal.  Musculoskeletal: She exhibits no edema.  Neurological: She is alert and oriented to person, place, and time.  Skin: Skin is warm and dry. No rash noted.  Psychiatric: She has a normal mood and affect.    Lab Results  Component Value Date   HGBA1C 5.30 12/06/2014    Assessment & Plan:   There are no diagnoses linked to this encounter.  No orders of the defined types were placed in this encounter.    Follow-up: No Follow-up on file.   Boykin Nearing MD

## 2015-02-19 NOTE — Assessment & Plan Note (Signed)
Noted on exam Nasal saline

## 2015-02-19 NOTE — Patient Instructions (Addendum)
Brittany Jimenez was seen today for insomnia.  Diagnoses and all orders for this visit:  Remote history of stroke -     Lipid Panel -     aspirin 81 MG tablet; Take 1 tablet (81 mg total) by mouth daily.  Hypertrophy of nasal turbinates  Insomnia -     traZODone (DESYREL) 50 MG tablet; Take 0.5-1 tablets (25-50 mg total) by mouth at bedtime as needed for sleep. -     zolpidem (AMBIEN) 5 MG tablet; Take 1 tablet (5 mg total) by mouth at bedtime as needed for sleep.   Go ahead and quit smoking. It is now life or death.   F/u with me in 6 weeks for pap smear and insomnia  Dr. Adrian Blackwater

## 2015-02-20 ENCOUNTER — Other Ambulatory Visit: Payer: Self-pay | Admitting: *Deleted

## 2015-02-20 DIAGNOSIS — G8311 Monoplegia of lower limb affecting right dominant side: Secondary | ICD-10-CM

## 2015-02-20 DIAGNOSIS — H53462 Homonymous bilateral field defects, left side: Secondary | ICD-10-CM

## 2015-02-20 DIAGNOSIS — R202 Paresthesia of skin: Secondary | ICD-10-CM

## 2015-02-20 LAB — LIPID PANEL
CHOL/HDL RATIO: 4.3 ratio (ref <=5.0)
CHOLESTEROL: 200 mg/dL (ref 125–200)
HDL: 46 mg/dL (ref >=46)
LDL Cholesterol: 119 mg/dL (ref <130)
Triglycerides: 175 mg/dL — ABNORMAL HIGH (ref <150)
VLDL: 35 mg/dL — AB (ref <30)

## 2015-02-20 MED ORDER — ATORVASTATIN CALCIUM 40 MG PO TABS: 40.0000 mg | ORAL_TABLET | Freq: Every day | ORAL | Status: DC

## 2015-02-20 MED FILL — ZOLPIDEM TARTRATE 5 MG TAB: 5 | 15 days supply | Qty: 15 | Fill #0

## 2015-02-20 NOTE — Addendum Note (Signed)
Addended by: Boykin Nearing on: 02/20/2015 08:37 AM   Modules accepted: Orders

## 2015-02-21 ENCOUNTER — Telehealth: Payer: Self-pay | Admitting: *Deleted

## 2015-02-21 NOTE — Telephone Encounter (Signed)
-----   Message from Boykin Nearing, MD sent at 02/20/2015  8:36 AM EST ----- Slightly elevated triglycerides Start lipitor 40 mg daily given hx of stroke for prevention of another stroke

## 2015-02-21 NOTE — Telephone Encounter (Signed)
Date of birth verified by pt Lab results given  Notified Rx at [facility]  Pt verbalized understanding

## 2015-02-27 ENCOUNTER — Ambulatory Visit (INDEPENDENT_AMBULATORY_CARE_PROVIDER_SITE_OTHER): Payer: Medicare Other | Admitting: Neurology

## 2015-02-27 DIAGNOSIS — G8311 Monoplegia of lower limb affecting right dominant side: Secondary | ICD-10-CM

## 2015-02-27 DIAGNOSIS — R202 Paresthesia of skin: Secondary | ICD-10-CM

## 2015-02-27 DIAGNOSIS — H53462 Homonymous bilateral field defects, left side: Secondary | ICD-10-CM

## 2015-02-27 DIAGNOSIS — F17200 Nicotine dependence, unspecified, uncomplicated: Secondary | ICD-10-CM

## 2015-02-27 NOTE — Procedures (Signed)
[facility]  [address]  [place], [place] [address] Tel: [phone] Fax:  [phone] Test Date:  02/27/2015  Patient: Brittany Jimenez DOB: [birth year] Physician: Narda Amber, DO  Sex: Female Height: 5\' 3"  Ref Phys: Narda Amber, DO  ID#: [id number] Temp: 32.6C Technician: Jerilynn Mages. Dean   Patient Complaints: This is a 50 year old female referred for evaluation of right leg weakness.  NCV & EMG Findings: Extensive electrodiagnostic testing of the right lower extremity and additional studies of the left shows: 1. Right sural and superficial peroneal sensory responses are within normal limits. 2. Right peroneal and tibial motor responses are within normal limits. 3. Right H reflex studies within normal limits. 4. There is no evidence of active or chronic motor axon loss changes affecting any of the tested muscles. Motor unit recruitment is hampered by incomplete activation of motor unit potentials in most of the muscles studied and the right lower extremity.  This may be due to poor voluntary effort, pain, or more likely central disorder of motor unit control.  Impression: This is a normal study of the right lower extremity; however, there is incomplete motor unit activation which can be seen in a central disorder of motor unit control.  There is no evidence of a generalized sensorimotor polyneuropathy or lumbosacral radiculopathy.  ___________________________ Narda Amber, DO    Nerve Conduction Studies Anti Sensory Summary Table   Site NR Peak (ms) Norm Peak (ms) P-T Amp (V) Norm P-T Amp  Right Sup Peroneal Anti Sensory (Ant Lat Mall)  12 cm    2.8 <4.6 9.5 >4  Right Sural Anti Sensory (Lat Mall)  Calf    3.3 <4.6 9.7 >4   Motor Summary Table   Site NR Onset (ms) Norm Onset (ms) O-P Amp (mV) Norm O-P Amp Site1 Site2 Delta-0 (ms) Dist (cm) Vel (m/s) Norm Vel (m/s)  Right Peroneal Motor (Ext Dig Brev)  Ankle    3.0 <6.0 4.7 >2.5 B Fib Ankle 6.8  32.0 47 >40  B Fib    9.8  4.2  Poplt B Fib 2.0 10.0 50 >40  Poplt    11.8  4.2         Right Tibial Motor (Abd Hall Brev)  Ankle    4.0 <6.0 15.9 >4 Knee Ankle 8.3 37.0 45 >40  Knee    12.3  14.8          H Reflex Studies   NR H-Lat (ms) Lat Norm (ms) L-R H-Lat (ms)  Right Tibial (Gastroc)     31.16 <35    EMG   Side Muscle Ins Act Fibs Psw Fasc Number Recrt Dur Dur. Amp Amp. Poly Poly. Comment  Right AntTibialis Nml Nml Nml Nml 2- Mod-V Nml Nml Nml Nml Nml Nml N/A  Right Gastroc Nml Nml Nml Nml 2- Mod-V Nml Nml Nml Nml Nml Nml N/A  Right Flex Dig Long Nml Nml Nml Nml 3- Mod-V Nml Nml Nml Nml Nml Nml N/A  Right RectFemoris Nml Nml Nml Nml 1- Mod-V Nml Nml Nml Nml Nml Nml N/A  Right GluteusMed Nml Nml Nml Nml Nml Nml Nml Nml Nml Nml Nml Nml N/A  Left AntTibialis Nml Nml Nml Nml Nml Nml Nml Nml Nml Nml Nml Nml N/A  Left Gastroc Nml Nml Nml Nml Nml Nml Nml Nml Nml Nml Nml Nml N/A      Waveforms:

## 2015-03-01 ENCOUNTER — Encounter (HOSPITAL_COMMUNITY): Payer: No Typology Code available for payment source

## 2015-03-01 ENCOUNTER — Other Ambulatory Visit: Payer: Self-pay

## 2015-03-01 ENCOUNTER — Ambulatory Visit (HOSPITAL_COMMUNITY): Payer: No Typology Code available for payment source | Attending: Neurology

## 2015-03-01 DIAGNOSIS — H53462 Homonymous bilateral field defects, left side: Secondary | ICD-10-CM

## 2015-03-01 DIAGNOSIS — I5189 Other ill-defined heart diseases: Secondary | ICD-10-CM | POA: Insufficient documentation

## 2015-03-01 DIAGNOSIS — G8311 Monoplegia of lower limb affecting right dominant side: Secondary | ICD-10-CM

## 2015-03-01 DIAGNOSIS — I340 Nonrheumatic mitral (valve) insufficiency: Secondary | ICD-10-CM | POA: Insufficient documentation

## 2015-03-01 DIAGNOSIS — R202 Paresthesia of skin: Secondary | ICD-10-CM

## 2015-03-05 ENCOUNTER — Encounter: Payer: Self-pay | Admitting: Neurology

## 2015-03-05 ENCOUNTER — Ambulatory Visit (INDEPENDENT_AMBULATORY_CARE_PROVIDER_SITE_OTHER): Payer: Self-pay | Admitting: Neurology

## 2015-03-05 ENCOUNTER — Other Ambulatory Visit (INDEPENDENT_AMBULATORY_CARE_PROVIDER_SITE_OTHER): Payer: Self-pay

## 2015-03-05 VITALS — BP 130/80 | HR 76 | Wt 182.0 lb

## 2015-03-05 DIAGNOSIS — Z8673 Personal history of transient ischemic attack (TIA), and cerebral infarction without residual deficits: Secondary | ICD-10-CM

## 2015-03-05 DIAGNOSIS — I6930 Unspecified sequelae of cerebral infarction: Secondary | ICD-10-CM | POA: Insufficient documentation

## 2015-03-05 DIAGNOSIS — H53462 Homonymous bilateral field defects, left side: Secondary | ICD-10-CM

## 2015-03-05 DIAGNOSIS — F17200 Nicotine dependence, unspecified, uncomplicated: Secondary | ICD-10-CM

## 2015-03-05 DIAGNOSIS — G8310 Monoplegia of lower limb affecting unspecified side: Secondary | ICD-10-CM

## 2015-03-05 LAB — C-REACTIVE PROTEIN: CRP: 0.7 mg/dL (ref 0.5–20.0)

## 2015-03-05 LAB — SEDIMENTATION RATE: SED RATE: 23 mm/h — AB (ref 0–22)

## 2015-03-05 NOTE — Progress Notes (Signed)
Follow-up Visit   Date: 03/05/2015    Brittany Jimenez MRN: [id number] DOB: 03-Apr-1965   Interim History: Brittany Jimenez is a 50 y.o. right-handed African American female with depression, hypertension, chronic left knee pain, and asthma returning to the clinic for follow-up of right leg weakness.  The patient was accompanied to the clinic by self.  History of present illness: Starting around October 2016, she began experiencing numbness over the tips of toes on the right. Numbness and tingling is constant and does not involve the sole or dorsum of the foot. She also recalls having weakness of the right leg which started abruptly.  She does not have any weakness or similar symptoms on the left foot. Nothing triggers, exacerbates, or alleviates her foot discomfort. Her PCP had started her on gabapentin 376m, but she did not take it because not tolerating this medication in the past. She endorses low back pain.   She had knee surgery on January 18th for left meniscal tear. She now feels that because of compensating on her right side, her ankles has started to pop and give-out.   Upon further questioning after her exam, she reports having left visual field cut for since 2013. She thought she needed glasses.   No personal history of stroke that she is aware of. No prior MRI brain. She has a family history which is significant for stroke and heart disease. She does not take daily aspirin.  UPDATE 03/05/2015: Interestingly, her CT brain showed multifocal remote infarcts, involving right parieto-occipital and left front, left insular, and left parietal region. There is no extra or intracranial large vessel stenosis. She has been unaware of any stroke previously, but again does recall having issue with her vision several years ago.  No history drug use.  She had one miscarriage early in pregnancy and two healthy grown children.  She has family history of stroke in sister and father.  She has been complaint with daily aspirin and statin therapy.  She reports having racing heart beat at times, but mostly when anxious.    Her NCS/EMG was normal.   She has noticed that she is able to weight bear on the right leg and move her toes better.  She has not started out-patient PT.   She has no new complaints.   She is trying to quit smoking and is down to 2 cigarettes daily!   Medications:  Current Outpatient Prescriptions on File Prior to Visit  Medication Sig Dispense Refill  . albuterol (PROVENTIL HFA;VENTOLIN HFA) 108 (90 BASE) MCG/ACT inhaler Inhale 2 puffs into the lungs every 6 (six) hours as needed. For shortness of breath 1 Inhaler 1 year  . aspirin 81 MG tablet Take 1 tablet (81 mg total) by mouth daily. 30 tablet 11  . atorvastatin (LIPITOR) 40 MG tablet Take 1 tablet (40 mg total) by mouth daily. 90 tablet 3  . busPIRone (BUSPAR) 5 MG tablet Take 1 tablet (5 mg total) by mouth 2 (two) times daily. 60 tablet 5  . hydrochlorothiazide (HYDRODIURIL) 25 MG tablet Take 1 tablet (25 mg total) by mouth daily. 30 tablet 5  . HYDROcodone-acetaminophen (NORCO) 7.5-325 MG tablet Take 1-2 tablets by mouth every 6 (six) hours as needed for moderate pain. 90 tablet 0  . ondansetron (ZOFRAN) 4 MG tablet Take 1-2 tablets (4-8 mg total) by mouth every 8 (eight) hours as needed for nausea or vomiting. 40 tablet 0  . traZODone (DESYREL) 50 MG tablet Take 0.5-1 tablets (25-50 mg  total) by mouth at bedtime as needed for sleep. 30 tablet 3  . zolpidem (AMBIEN) 5 MG tablet Take 1 tablet (5 mg total) by mouth at bedtime as needed for sleep. 15 tablet 0   No current facility-administered medications on file prior to visit.    Allergies: No Known Allergies  Review of Systems:  CONSTITUTIONAL: No fevers, chills, night sweats, or weight loss.  EYES: No visual changes or eye pain ENT: No hearing changes.  No history of nose bleeds.   RESPIRATORY: No cough, wheezing and shortness of breath.     CARDIOVASCULAR: Negative for chest pain, and palpitations.   GI: Negative for abdominal discomfort, blood in stools or black stools.  No recent change in bowel habits.   GU:  No history of incontinence.   MUSCLOSKELETAL: No history of joint pain or swelling.  No myalgias.   SKIN: Negative for lesions, rash, and itching.   ENDOCRINE: Negative for cold or heat intolerance, polydipsia or goiter.   PSYCH:  No depression or anxiety symptoms.   NEURO: As Above.   Vital Signs:  BP 130/80 mmHg  Pulse 76  Wt 182 lb (82.555 kg)  SpO2 100%  LMP 01/29/2013 Pain Scale: 0 on a scale of 0-10   General: Well appearing Neck: no carotid bruit CV: regular rate and rhythm Ext: no edema  Neurological Exam: MENTAL STATUS including orientation to time, place, person, recent and remote memory, attention span and concentration, language, and fund of knowledge is normal.  Speech is not dysarthric.  CRANIAL NERVES: Left homonymous hemianopia.  Pupils equal round and reactive to light.  Normal conjugate, extra-ocular eye movements in all directions of gaze.  Mild left ptosis. Normal facial sensation.  Face is symmetric. Palate elevates symmetrically.  Tongue is midline.   MOTOR:  No atrophy, fasciculations or abnormal movements.  No pronator drift.  Tone is normal.    Right Upper Extremity:    Left Upper Extremity:    Deltoid  5/5   Deltoid  5/5   Biceps  5/5   Biceps  5/5   Triceps  5/5   Triceps  5/5   Wrist extensors  5/5   Wrist extensors  5/5   Wrist flexors  5/5   Wrist flexors  5/5   Finger extensors  5/5   Finger extensors  5/5   Finger flexors  5/5   Finger flexors  5/5   Dorsal interossei  5/5   Dorsal interossei  5/5   Abductor pollicis  5/5   Abductor pollicis  5/5   Tone (Ashworth scale)  0  Tone (Ashworth scale)  0   Right Lower Extremity:    Left Lower Extremity:    Hip flexors  5/5   Hip flexors  5/5   Hip extensors  5/5   Hip extensors  5/5   Knee flexors  5-/5   Knee flexors   5/5   Knee extensors  5/5   Knee extensors  5/5   Dorsiflexors  5-/5   Dorsiflexors  5/5   Plantarflexors  5/5   Plantarflexors  5/5   Toe extensors  5-/5   Toe extensors  5/5   Toe flexors  4/5   Toe flexors  5/5   Tone (Ashworth scale)  0  Tone (Ashworth scale)  0   MSRs:  Right  Left brachioradialis 2+  brachioradialis 2+  biceps 2+  biceps 2+  triceps 2+  triceps 2+  patellar 3+  patellar 3+  ankle jerk 1+  ankle jerk 1+   SENSORY: Absent vibration at the right great toe, temperature and pin prick reduced over the tips of the toes on the right  COORDINATION/GAIT: Gait narrow based and stable. She is able to briefly stand on heels and toes.  Data: NCS/EMG of the right lower extremity 02/27/2015:  This is a normal study of the right lower extremity; however, there is incomplete motor unit activation which can be seen in a central disorder of motor unit control.  CT/A head 02/19/2015:  No major vessel occlusion or correctable proximal stenosis. Old cortical and subcortical infarctions in the right parieto-occipital region, left frontal region, left insula, and left parietal region. No sign of acute infarction. 13 mm meningioma at the anterior middle cranial fossa on the right without significant mass effect upon the brain.  US carotids 02/15/2015:  1-39% ICA stenosis bilaterally  Echo 03/01/2015:  LVEF 60-65%, normal wall thickness, normal wall motion, diastolic dysfunction with indeterminate LV filling pressure, normal LAsize, mild MV thickening with mild MR, negative for PFO by saline microbubble contrast.  Lab Results  Component Value Date   HGBA1C 5.30 12/06/2014   Lab Results  Component Value Date   CHOL 200 02/19/2015   HDL 46 02/19/2015   LDLCALC 119 02/19/2015   TRIG 175* 02/19/2015   CHOLHDL 4.3 02/19/2015    IMPRESSION/PLAN: 1.  Remote multifocal ischemic infarcts involving right PCA and left MCA  territories, unclear , manifesting with left homonymous hemianopia and right leg weakness  - No small or large vessel stenosis on imaging which was reviewed, normal echo  - No diabetes or drug use   - Risk factors:  Hypertension (well-controlled), hyperlipidemia (LDL 119), tobacco use (trying to quit), family history of stroke  - Need to look for other thromboembolic states and cardiac arrythmias   - Check ESR, CRP, ANA, hypercoagulable panel   - 30-day cardiac monitor  - Start physical therapy for leg strengthening  - Secondary stroke prevention   - Continue aspirin 52m daily   - Continue lipitor 475mmg    - Continue BP medications   - Encouraged to quit smoking  - Refer to opthalmology for dilated eye exam and formal visual field testing for left homonymous hemianopia  2.  Tobacco use  - Try to quit and down to two cigarretes daily  Return to clinic in 3 months   The duration of this appointment visit was 30 minutes of face-to-face time with the patient.  Greater than 50% of this time was spent in counseling, explanation of diagnosis, planning of further management, and coordination of care.   Thank you for allowing me to participate in patient's care.  If I can answer any additional questions, I would be pleased to do so.    Sincerely,    Armarion Greek K. PaPosey ProntoDO

## 2015-03-05 NOTE — Patient Instructions (Addendum)
1.  Cardiac monitor 2.  Check blood work 3.  Referral to opthalmology for formal visual field testing  4.  Referral to physical therapy for right leg strengthening 5.  So proud of you for reducing smoking - you're on the track to quitting!  Return to clinic in 3 months   You will always be at an increased risk of stroke.  Stroke is a medical emergency.  Know these warning signs of stroke. Every second counts:  Sudden numbness or weakness of the face, arm or leg, especially on one side of the body,  Sudden confusion, trouble speaking or understanding,  Sudden trouble seeing in one eye or both eyes,  Sudden trouble walking, dizziness, loss of balance or coordination,  Sudden severe headache with no known cause.  If you or someone with you has one or more of these signs, don't delay!  Immediately call 9-1-1, or the emergency medical services (EMS) number so an ambulance (ideally with advanced life support) can be sent for you.  Also, check the time so that you will know when the symptoms first appeared. It's very important to take immediate action. Medical treatment may be available if action is taken early enough.

## 2015-03-06 LAB — ANA: ANA: NEGATIVE

## 2015-03-08 ENCOUNTER — Ambulatory Visit: Payer: Self-pay

## 2015-03-08 ENCOUNTER — Encounter: Payer: Self-pay | Admitting: *Deleted

## 2015-03-08 ENCOUNTER — Other Ambulatory Visit: Payer: Self-pay | Admitting: Neurology

## 2015-03-08 DIAGNOSIS — H53462 Homonymous bilateral field defects, left side: Secondary | ICD-10-CM

## 2015-03-08 DIAGNOSIS — I639 Cerebral infarction, unspecified: Secondary | ICD-10-CM

## 2015-03-08 DIAGNOSIS — I6930 Unspecified sequelae of cerebral infarction: Secondary | ICD-10-CM

## 2015-03-08 DIAGNOSIS — I4891 Unspecified atrial fibrillation: Secondary | ICD-10-CM

## 2015-03-08 DIAGNOSIS — G8310 Monoplegia of lower limb affecting unspecified side: Secondary | ICD-10-CM

## 2015-03-08 LAB — RFX DRVVT SCR W/RFLX CONF 1:1 MIX: DRVVT SCREEN: 38 s (ref <=45)

## 2015-03-08 LAB — RFX PTT-LA W/RFX TO HEX PHASE CONF: PTT-LA Screen: 33 s (ref <=40)

## 2015-03-08 NOTE — Progress Notes (Signed)
Patient ID: Brittany Jimenez, female   DOB: [birth year], 50 y.o.   MRN: [id number] Patient given enrollment form  to apply for hardship program through [place], for a 30 day cardiac event monitor.  She will need to be scheduled pending hardship program approval, to have a [employer] 30 day cardiac event monitor applied in our office.  Patient does not have a land line telephone, so a monitor cannot be mailed to her home.

## 2015-03-09 LAB — HYPERCOAGULABLE PANEL, COMPREHENSIVE
AntiThromb III Func: 106 % activity (ref 80–120)
Anticardiolipin IgA: <11 [APL'U]
Anticardiolipin IgM: <12 [MPL'U]
Beta-2 Glyco I IgG: <9 SGU (ref <=20)
Beta-2-Glycoprotein I IgA: 9 SAU (ref <=20)
PROTEIN C ACTIVITY: 100 % (ref 70–180)
PROTEIN C ANTIGEN: 96 % (ref 70–140)
PROTEIN S ACTIVITY: 67 % (ref 60–140)
PROTEIN S ANTIGEN, TOTAL: 98 % (ref 70–140)

## 2015-03-14 ENCOUNTER — Telehealth: Payer: Self-pay | Admitting: Neurology

## 2015-03-14 NOTE — Telephone Encounter (Signed)
PT left message that she would like a call back/Dawn CB# [phone]

## 2015-03-14 NOTE — Telephone Encounter (Signed)
Patient called back and said that she has not heard from eye doctor.  Informed her that I sent the referral but I will send it again in the morning.

## 2015-03-14 NOTE — Telephone Encounter (Signed)
Left message for patient to call me back. 

## 2015-03-15 ENCOUNTER — Encounter: Payer: Self-pay | Admitting: *Deleted

## 2015-03-15 NOTE — Progress Notes (Signed)
Patient ID: Brittany Jimenez, female   DOB: 1965-05-19, 50 y.o.   MRN: [id number] Patient enrolled with [employer] for a cardiac event monitor to be applied at [facility], [employer] if hardship application approved.

## 2015-03-19 ENCOUNTER — Telehealth: Payer: Self-pay | Admitting: Family Medicine

## 2015-03-19 DIAGNOSIS — G4700 Insomnia, unspecified: Secondary | ICD-10-CM

## 2015-03-19 NOTE — Telephone Encounter (Signed)
Pt has concerns with her sleeping medication that was prescribed  States the pills are not working Please contact

## 2015-03-20 ENCOUNTER — Ambulatory Visit (INDEPENDENT_AMBULATORY_CARE_PROVIDER_SITE_OTHER): Payer: Self-pay

## 2015-03-20 DIAGNOSIS — H53462 Homonymous bilateral field defects, left side: Secondary | ICD-10-CM

## 2015-03-20 DIAGNOSIS — I639 Cerebral infarction, unspecified: Secondary | ICD-10-CM

## 2015-03-20 DIAGNOSIS — I4891 Unspecified atrial fibrillation: Secondary | ICD-10-CM

## 2015-03-20 DIAGNOSIS — Z8673 Personal history of transient ischemic attack (TIA), and cerebral infarction without residual deficits: Secondary | ICD-10-CM

## 2015-03-20 DIAGNOSIS — G8310 Monoplegia of lower limb affecting unspecified side: Secondary | ICD-10-CM

## 2015-03-20 NOTE — Telephone Encounter (Signed)
Pt. Came into facility requesting to speak to the nurse because the zolpidem (AMBIEN) 5 MG tablet is not helping. Please  F/u with pt.

## 2015-03-22 MED ORDER — TRAZODONE HCL 100 MG PO TABS: 100.0000 mg | ORAL_TABLET | Freq: Every day | ORAL | Status: DC

## 2015-03-22 MED FILL — traZODone HCL 100 MG TABS: 100 | 30 days supply | Qty: 30 | Fill #0

## 2015-03-22 NOTE — Telephone Encounter (Signed)
Pt. Was told that an new Rx for traZODone (DESYREL) 100 MG tablet was sent to the pharmacy.

## 2015-03-22 NOTE — Telephone Encounter (Signed)
Please advise patient to increase trazodone to 100 mg

## 2015-03-23 MED FILL — ?BUSPIRONE HCL 5 MG TABLET: 5 | 30 days supply | Qty: 60 | Fill #1

## 2015-03-23 MED FILL — HYDROCHLOROTHIAZIDE 25 MG T: 25 | 30 days supply | Qty: 30 | Fill #1

## 2015-03-23 NOTE — Telephone Encounter (Signed)
Pt notified Rx send to [facility] yesterday

## 2015-03-26 ENCOUNTER — Encounter (HOSPITAL_COMMUNITY): Payer: Self-pay | Admitting: Family Medicine

## 2015-03-26 ENCOUNTER — Emergency Department (HOSPITAL_COMMUNITY)
Admission: EM | Admit: 2015-03-26 | Discharge: 2015-03-26 | Disposition: A | Discharge status: Home / Self Care | Payer: Medicare Other | Attending: Emergency Medicine | Admitting: Emergency Medicine

## 2015-03-26 DIAGNOSIS — M1990 Unspecified osteoarthritis, unspecified site: Secondary | ICD-10-CM | POA: Insufficient documentation

## 2015-03-26 DIAGNOSIS — I10 Essential (primary) hypertension: Secondary | ICD-10-CM | POA: Insufficient documentation

## 2015-03-26 DIAGNOSIS — Z79899 Other long term (current) drug therapy: Secondary | ICD-10-CM | POA: Insufficient documentation

## 2015-03-26 DIAGNOSIS — R2231 Localized swelling, mass and lump, right upper limb: Secondary | ICD-10-CM | POA: Insufficient documentation

## 2015-03-26 DIAGNOSIS — Z7982 Long term (current) use of aspirin: Secondary | ICD-10-CM | POA: Insufficient documentation

## 2015-03-26 DIAGNOSIS — F419 Anxiety disorder, unspecified: Secondary | ICD-10-CM | POA: Insufficient documentation

## 2015-03-26 DIAGNOSIS — F329 Major depressive disorder, single episode, unspecified: Secondary | ICD-10-CM | POA: Insufficient documentation

## 2015-03-26 DIAGNOSIS — F17210 Nicotine dependence, cigarettes, uncomplicated: Secondary | ICD-10-CM | POA: Insufficient documentation

## 2015-03-26 DIAGNOSIS — G8929 Other chronic pain: Secondary | ICD-10-CM | POA: Insufficient documentation

## 2015-03-26 DIAGNOSIS — E669 Obesity, unspecified: Secondary | ICD-10-CM | POA: Insufficient documentation

## 2015-03-26 DIAGNOSIS — J45909 Unspecified asthma, uncomplicated: Secondary | ICD-10-CM | POA: Insufficient documentation

## 2015-03-26 NOTE — Discharge Instructions (Signed)
You have been seen today for a hand nodule and pain. These nodules can typically be remedied by using damp heat and working through the range of motion of the finger multiple times a day. Ibuprofen may be used for pain and inflammation. Follow up with PCP as needed if symptoms continue. Return to ED should symptoms worsen.

## 2015-03-26 NOTE — ED Notes (Signed)
Declined W/C at D/C and was escorted to lobby by RN. 

## 2015-03-26 NOTE — ED Provider Notes (Signed)
+9*0/CSN: [id number]     Arrival date & time 03/26/15  1214 History  By signing my name below, I, Brittany Jimenez, attest that this documentation has been prepared under the direction and in the presence of Arlean Hopping, PA-C Electronically Signed: Ladene Artist, ED Scribe 03/26/2015 at 2:53 PM.   Chief Complaint  Patient presents with  . Hand Pain   The history is provided by the patient. No language interpreter was used.   HPI Comments: Brittany Jimenez is a 50 y.o. female, with a h/o arthritis, who presents to the Emergency Department complaining of a tender area of swelling to the right palm first noticed yesterday. Pt reports mild tenderness to the area that is exacerbated with movement. She denies injury. No treatments tried PTA. She denies h/o similar symptoms. Denies neuro or functional deficits.  Past Medical History  Diagnosis Date  . Obesity   . Chronic pain   . Depression   . Arthritis   . Asthma   . Hypertension   . Anxiety    Past Surgical History  Procedure Laterality Date  . Appendectomy    . Tubal ligation  1995  . Knee arthroscopy with medial menisectomy Left 01/31/2015    Procedure: LEFT KNEE ARTHROSCOPY WITH PARTIAL MEDIAL MENISCECTOMY;  Surgeon: Leandrew Koyanagi, MD;  Location: [facility];  Service: Orthopedics;  Laterality: Left;  . Synovectomy Left 01/31/2015    Procedure: SYNOVECTOMY;  Surgeon: Leandrew Koyanagi, MD;  Location: [facility];  Service: Orthopedics;  Laterality: Left;   Family History  Problem Relation Age of Onset  . Heart disease Mother   . Early death Mother   . Asthma Mother   . Diabetes Mother   . Hypertension Mother   . Cancer Father   . Alcohol abuse Father   . Early death Father   . Hypertension Father   . Asthma Sister   . Cancer Sister   . Hypertension Sister   . Depression Brother   . Drug abuse Brother   . Hypertension Brother    Social History  Substance Use Topics  . Smoking status: Current Every  Day Smoker -- 0.25 packs/day for 35 years    Types: Cigarettes  . Smokeless tobacco: Never Used  . Alcohol Use: No   OB History    No data available     Review of Systems  Musculoskeletal: Positive for arthralgias.  Neurological: Negative for weakness and numbness.   Allergies  Review of patient's allergies indicates no known allergies.  Home Medications   Prior to Admission medications   Medication Sig Start Date End Date Taking? Authorizing Provider  albuterol (PROVENTIL HFA;VENTOLIN HFA) 108 (90 BASE) MCG/ACT inhaler Inhale 2 puffs into the lungs every 6 (six) hours as needed. For shortness of breath 12/08/12   Tresa Garter, MD  aspirin 81 MG tablet Take 1 tablet (81 mg total) by mouth daily. 02/19/15   Josalyn Funches, MD  atorvastatin (LIPITOR) 40 MG tablet Take 1 tablet (40 mg total) by mouth daily. 02/20/15   Josalyn Funches, MD  busPIRone (BUSPAR) 5 MG tablet Take 1 tablet (5 mg total) by mouth 2 (two) times daily. 01/23/15   Josalyn Funches, MD  hydrochlorothiazide (HYDRODIURIL) 25 MG tablet Take 1 tablet (25 mg total) by mouth daily. 01/23/15   Josalyn Funches, MD  HYDROcodone-acetaminophen (NORCO) 7.5-325 MG tablet Take 1-2 tablets by mouth every 6 (six) hours as needed for moderate pain. 01/31/15   Naiping Ephriam Jenkins, MD  ondansetron (ZOFRAN) 4 MG tablet Take 1-2 tablets (4-8 mg total) by mouth every 8 (eight) hours as needed for nausea or vomiting. 01/31/15   Leandrew Koyanagi, MD  traZODone (DESYREL) 100 MG tablet Take 1 tablet (100 mg total) by mouth at bedtime. 03/22/15   Josalyn Funches, MD   BP 119/72 mmHg  Pulse 71  Temp(Src) 98.3 F (36.8 C) (Oral)  Resp 18  SpO2 99%  LMP 01/29/2013 Physical Exam  Constitutional: She appears well-developed and well-nourished. No distress.  HENT:  Head: Normocephalic and atraumatic.  Eyes: Conjunctivae are normal.  Neck: Neck supple. No tracheal deviation present.  Cardiovascular: Normal rate.   Pulmonary/Chest: Effort normal. No  respiratory distress.  Musculoskeletal: Normal range of motion.  Small tender nodule on palmar surface just proximal to the R 2nd finger  Neurological: She is alert.  Skin: Skin is warm and dry.  Psychiatric: She has a normal mood and affect. Her behavior is normal.  Nursing note and vitals reviewed.  ED Course  Procedures (including critical care time) DIAGNOSTIC STUDIES: Oxygen Saturation is 99% on RA, normal by my interpretation.    COORDINATION OF CARE: 2:51 PM-Discussed treatment plan which includes ibuprofen with pt at bedside and pt agreed to plan.     MDM   Final diagnoses:  Nodule of finger of right hand    Brittany Jimenez presents with a small nodule to the palm of her right hand, first noticed yesterday.  This nodule appears to be a small cyst. It does not interfere with the patient's hand function. Home management and return precautions were discussed. Patient to follow up with PCP should symptoms continue.  I personally performed the services described in this documentation, which was scribed in my presence. The recorded information has been reviewed and is accurate.   Lorayne Bender, PA-C 03/26/15 1458  Julianne Rice, MD 04/04/15 [phone]

## 2015-03-26 NOTE — ED Notes (Signed)
Pt here for knot to palm of right hand. sts noticed yesterday. Painful to move hand.

## 2015-04-09 ENCOUNTER — Ambulatory Visit: Payer: Medicare Other | Admitting: Neurology

## 2015-04-11 DIAGNOSIS — H524 Presbyopia: Secondary | ICD-10-CM | POA: Diagnosis not present

## 2015-04-11 DIAGNOSIS — H5340 Unspecified visual field defects: Secondary | ICD-10-CM | POA: Diagnosis not present

## 2015-04-18 ENCOUNTER — Ambulatory Visit: Payer: Medicare Other | Admitting: Neurology

## 2015-04-23 ENCOUNTER — Ambulatory Visit: Payer: Medicare Other | Admitting: Family Medicine

## 2015-05-02 DIAGNOSIS — H5340 Unspecified visual field defects: Secondary | ICD-10-CM | POA: Diagnosis not present

## 2015-05-02 MED FILL — HYDROCHLOROTHIAZIDE 25 MG T: 25 | 30 days supply | Qty: 30 | Fill #2

## 2015-05-07 ENCOUNTER — Telehealth: Payer: Self-pay | Admitting: Neurology

## 2015-05-07 NOTE — Telephone Encounter (Signed)
Brittany Jimenez [birth year] called today wanting to speak with you. She had an appointment with Dr. Posey Pronto on 04/18/15 she said the appointment was cancelled but that she didn't cancel it. Her next appointment is on 06/04/15. She wants to get in sooner, but I don't think Dr. Posey Pronto has anything. Her call back number is [phone]. Thank you

## 2015-05-08 NOTE — Telephone Encounter (Signed)
I spoke with patient and informed her that we have May 10 open.  Patient agreed to take that spot.

## 2015-05-10 MED FILL — traZODone HCL 100 MG TABS: 100 | 30 days supply | Qty: 30 | Fill #1

## 2015-05-23 ENCOUNTER — Ambulatory Visit (INDEPENDENT_AMBULATORY_CARE_PROVIDER_SITE_OTHER): Payer: Self-pay | Admitting: Neurology

## 2015-05-23 ENCOUNTER — Encounter: Payer: Self-pay | Admitting: Neurology

## 2015-05-23 VITALS — BP 118/62 | HR 64 | Ht 63.0 in | Wt 194.0 lb

## 2015-05-23 DIAGNOSIS — Z8673 Personal history of transient ischemic attack (TIA), and cerebral infarction without residual deficits: Secondary | ICD-10-CM

## 2015-05-23 DIAGNOSIS — F17200 Nicotine dependence, unspecified, uncomplicated: Secondary | ICD-10-CM

## 2015-05-23 DIAGNOSIS — I639 Cerebral infarction, unspecified: Secondary | ICD-10-CM

## 2015-05-23 DIAGNOSIS — I6930 Unspecified sequelae of cerebral infarction: Secondary | ICD-10-CM

## 2015-05-23 NOTE — Patient Instructions (Signed)
Encouraged to stop smoking Discuss with your primary care doctor about alternative options for cholesterol management Continue home exercises for toe strengthening  Return to clinic 6 months

## 2015-05-23 NOTE — Progress Notes (Signed)
Follow-up Visit   Date: 05/23/2015    Brittany Jimenez MRN: [id number] DOB: 1965/06/29   Interim History: Brittany Jimenez is a 50 y.o. right-handed African American female with depression, hypertension, chronic left knee pain, and asthma returning to the clinic for follow-up of right leg weakness.  The patient was accompanied to the clinic by self.  History of present illness: Starting around October 2016, she began experiencing numbness over the tips of toes on the right. Numbness and tingling is constant and does not involve the sole or dorsum of the foot. She also recalls having weakness of the right leg which started abruptly.  She does not have any weakness or similar symptoms on the left foot. Nothing triggers, exacerbates, or alleviates her foot discomfort. Her PCP had started her on gabapentin 315m, but she did not take it because not tolerating this medication in the past. She endorses low back pain.   She had knee surgery on January 18th for left meniscal tear. She now feels that because of compensating on her right side, her ankles has started to pop and give-out.   Upon further questioning after her exam, she reports having left visual field cut for since 2013. She thought she needed glasses.   No personal history of stroke that she is aware of. No prior MRI brain. She has a family history which is significant for stroke and heart disease. She does not take daily aspirin.  UPDATE 03/05/2015: Interestingly, her CT brain showed multifocal remote infarcts, involving right parieto-occipital and left front, left insular, and left parietal region. There is no extra or intracranial large vessel stenosis. She has been unaware of any stroke previously, but again does recall having issue with her vision several years ago.  No history drug use.  She had one miscarriage early in pregnancy and two healthy grown children.  She has family history of stroke in sister and father.  She has been complaint with daily aspirin and statin therapy.  She reports having racing heart beat at times, but mostly when anxious.    Her NCS/EMG was normal.   She has noticed that she is able to weight bear on the right leg and move her toes better.  She has not started out-patient PT.   She has no new complaints.   She is trying to quit smoking and is down to 2 cigarettes daily!  UPDATE 05/23/2015:  She has no new complaints today.  Her hypercoagulable work-up was negative and 30-day cardiac monitor did not show concerning arrhythmias.  She has been doing her home PT exercises and noticed improved strength of the right foot.  She is still smoking two cigerettes daily, but will make an effort to quit tomorrow, which is her daughter's birthday. She had dilated eye exam [name] who noted her left visual field cut (report not available to review).   Medications:  Current Outpatient Prescriptions on File Prior to Visit  Medication Sig Dispense Refill  . aspirin 81 MG tablet Take 1 tablet (81 mg total) by mouth daily. 30 tablet 11  . busPIRone (BUSPAR) 5 MG tablet Take 1 tablet (5 mg total) by mouth 2 (two) times daily. 60 tablet 5  . hydrochlorothiazide (HYDRODIURIL) 25 MG tablet Take 1 tablet (25 mg total) by mouth daily. 30 tablet 5  . traZODone (DESYREL) 100 MG tablet Take 1 tablet (100 mg total) by mouth at bedtime. 30 tablet 5  . albuterol (PROVENTIL HFA;VENTOLIN HFA) 108 (90  BASE) MCG/ACT inhaler Inhale 2 puffs into the lungs every 6 (six) hours as needed. For shortness of breath (Patient not taking: Reported on 05/23/2015) 1 Inhaler 1 year   No current facility-administered medications on file prior to visit.    Allergies: No Known Allergies  Review of Systems:  CONSTITUTIONAL: No fevers, chills, night sweats, or weight loss.  EYES: No visual changes or eye pain ENT: No hearing changes.  No history of nose bleeds.   RESPIRATORY: No cough, wheezing  and shortness of breath.   CARDIOVASCULAR: Negative for chest pain, and palpitations.   GI: Negative for abdominal discomfort, blood in stools or black stools.  No recent change in bowel habits.   GU:  No history of incontinence.   MUSCLOSKELETAL: No history of joint pain or swelling.  No myalgias.   SKIN: Negative for lesions, rash, and itching.   ENDOCRINE: Negative for cold or heat intolerance, polydipsia or goiter.   PSYCH:  No depression or anxiety symptoms.   NEURO: As Above.   Vital Signs:  BP 118/62 mmHg  Pulse 64  Ht 5' 3"  (1.6 m)  Wt 194 lb (87.998 kg)  BMI 34.37 kg/m2  SpO2 98%  LMP 01/29/2013 Pain Scale: 0 on a scale of 0-10  Neurological Exam: MENTAL STATUS including orientation to time, place, person, recent and remote memory, attention span and concentration, language, and fund of knowledge is normal.  Speech is not dysarthric.  CRANIAL NERVES: Left homonymous hemianopia.  Pupils equal round and reactive to light.  Normal conjugate, extra-ocular eye movements in all directions of gaze.  Mild right ptosis. Normal facial sensation.  Face is symmetric. Palate elevates symmetrically.  Tongue is midline.  MOTOR:  No atrophy, fasciculations or abnormal movements.  No pronator drift.  Tone is normal.    Right Upper Extremity:    Left Upper Extremity:    Deltoid  5/5   Deltoid  5/5   Biceps  5/5   Biceps  5/5   Triceps  5/5   Triceps  5/5   Wrist extensors  5/5   Wrist extensors  5/5   Wrist flexors  5/5   Wrist flexors  5/5   Finger extensors  5/5   Finger extensors  5/5   Finger flexors  5/5   Finger flexors  5/5   Dorsal interossei  5/5   Dorsal interossei  5/5   Abductor pollicis  5/5   Abductor pollicis  5/5   Tone (Ashworth scale)  0  Tone (Ashworth scale)  0   Right Lower Extremity:    Left Lower Extremity:    Hip flexors  5/5   Hip flexors  5/5   Hip extensors  5/5   Hip extensors  5/5   Knee flexors  5/5   Knee flexors  5/5   Knee extensors  5/5   Knee  extensors  5/5   Dorsiflexors  5/5   Dorsiflexors  5/5   Plantarflexors  5/5   Plantarflexors  5/5   Toe extensors  5/5   Toe extensors  5/5   Toe flexors  4/5   Toe flexors  5/5   Tone (Ashworth scale)  0  Tone (Ashworth scale)  0   MSRs:  Right  Left brachioradialis 2+  brachioradialis 2+  biceps 2+  biceps 2+  triceps 2+  triceps 2+  patellar 3+  patellar 3+  ankle jerk 1+  ankle jerk 1+   SENSORY: Absent vibration at the right great toe, temperature and pin prick reduced over the tips of the toes on the right  COORDINATION/GAIT: Gait narrow based and stable.  Data: NCS/EMG of the right lower extremity 02/27/2015:  This is a normal study of the right lower extremity; however, there is incomplete motor unit activation which can be seen in a central disorder of motor unit control.  CT/A head 02/19/2015:  No major vessel occlusion or correctable proximal stenosis. Old cortical and subcortical infarctions in the right parieto-occipital region, left frontal region, left insula, and left parietal region. No sign of acute infarction. 13 mm meningioma at the anterior middle cranial fossa on the right without significant mass effect upon the brain.  US carotids 02/15/2015:  1-39% ICA stenosis bilaterally  Echo 03/01/2015:  LVEF 60-65%, normal wall thickness, normal wall motion, diastolic dysfunction with indeterminate LV filling pressure, normal LAsize, mild MV thickening with mild MR, negative for PFO by saline microbubble contrast.  Lab Results  Component Value Date   HGBA1C 5.30 12/06/2014   Lab Results  Component Value Date   CHOL 200 02/19/2015   HDL 46 02/19/2015   LDLCALC 119 02/19/2015   TRIG 175* 02/19/2015   CHOLHDL 4.3 02/19/2015   Labs 03/05/2015:  Hypercoagulable panel, ESR 23, CRP 0.7, ANA neg  IMPRESSION/PLAN: 1.  Remote multifocal ischemic infarcts involving right PCA and left MCA territories, unclear  etiology, manifesting with left homonymous hemianopia and right leg weakness  - No small or large vessel stenosis on imaging which was reviewed, normal echo  - No diabetes or drug use   - Hypercoagulable panel and inflammatory markers are normal  - No cardiac arryhtmias on 30-day monitor  - Risk factors:  Hypertension (well-controlled), hyperlipidemia (LDL 119), tobacco use (trying to quit), family history of stroke  - If she develops any new neurological symptoms, she will need CT chest/abd/pelvis for malignancy evaluation  - Secondary stroke prevention   - Continue aspirin 38m daily   - Patient self-discontinued lipitor due to nausea and will be discussing alternative options with PCP, with her LDL 119, recommend being on statin therapy   - Continue BP medications     2.  Tobacco use  - Encouraged to quit smoking  Return to clinic in 6 months   The duration of this appointment visit was 25 minutes of face-to-face time with the patient.  Greater than 50% of this time was spent in counseling, explanation of diagnosis, planning of further management, and coordination of care.   Thank you for allowing me to participate in patient's care.  If I can answer any additional questions, I would be pleased to do so.    Sincerely,    Donika K. PPosey Pronto DO

## 2015-06-04 ENCOUNTER — Ambulatory Visit: Payer: Self-pay | Admitting: Neurology

## 2015-06-04 ENCOUNTER — Ambulatory Visit: Payer: Medicare Other | Admitting: Neurology

## 2015-06-14 MED FILL — traZODone HCL 100 MG TABS: 100 | 30 days supply | Qty: 30 | Fill #2

## 2015-06-25 ENCOUNTER — Ambulatory Visit: Payer: Self-pay | Attending: Family Medicine | Admitting: Family Medicine

## 2015-06-25 ENCOUNTER — Encounter: Payer: Self-pay | Admitting: Family Medicine

## 2015-06-25 VITALS — BP 136/80 | HR 60 | Temp 98.4°F | Resp 13 | Ht 63.0 in | Wt 186.0 lb

## 2015-06-25 DIAGNOSIS — Z0000 Encounter for general adult medical examination without abnormal findings: Secondary | ICD-10-CM | POA: Insufficient documentation

## 2015-06-25 DIAGNOSIS — I639 Cerebral infarction, unspecified: Secondary | ICD-10-CM

## 2015-06-25 DIAGNOSIS — M25571 Pain in right ankle and joints of right foot: Secondary | ICD-10-CM | POA: Insufficient documentation

## 2015-06-25 DIAGNOSIS — Z7982 Long term (current) use of aspirin: Secondary | ICD-10-CM | POA: Insufficient documentation

## 2015-06-25 DIAGNOSIS — F17210 Nicotine dependence, cigarettes, uncomplicated: Secondary | ICD-10-CM | POA: Insufficient documentation

## 2015-06-25 DIAGNOSIS — M79674 Pain in right toe(s): Secondary | ICD-10-CM | POA: Insufficient documentation

## 2015-06-25 MED ORDER — INDOMETHACIN 25 MG PO CAPS: 25.0000 mg | ORAL_CAPSULE | Freq: Three times a day (TID) | ORAL | Status: DC

## 2015-06-25 MED ORDER — ACETAMINOPHEN-CODEINE #3 300-30 MG PO TABS: 1.0000 | ORAL_TABLET | Freq: Three times a day (TID) | ORAL | Status: DC | PRN

## 2015-06-25 MED FILL — ACETAMINOPHEN/COD #3 TABLET: 300-30 | 6 days supply | Qty: 20 | Fill #0

## 2015-06-25 MED FILL — INDOMETHACIN 25 MG CAPSULE: 25 | 5 days supply | Qty: 15 | Fill #0

## 2015-06-25 NOTE — Progress Notes (Signed)
Subjective:  Patient ID: Brittany Jimenez, female    DOB: 07-Oct-1965  Age: 50 y.o. MRN: [id number]  CC: Results; Toe Pain; and Medication Reaction   HPI Brittany Jimenez presents for   1. R great toe pain: for past month with mild swelling and numbness. No injury. No fever or chills.   Social History  Substance Use Topics  . Smoking status: Current Every Day Smoker -- 0.25 packs/day for 35 years    Types: Cigarettes  . Smokeless tobacco: Never Used  . Alcohol Use: No    Outpatient Prescriptions Prior to Visit  Medication Sig Dispense Refill  . albuterol (PROVENTIL HFA;VENTOLIN HFA) 108 (90 BASE) MCG/ACT inhaler Inhale 2 puffs into the lungs every 6 (six) hours as needed. For shortness of breath 1 Inhaler 1 year  . aspirin 81 MG tablet Take 1 tablet (81 mg total) by mouth daily. 30 tablet 11  . busPIRone (BUSPAR) 5 MG tablet Take 1 tablet (5 mg total) by mouth 2 (two) times daily. 60 tablet 5  . hydrochlorothiazide (HYDRODIURIL) 25 MG tablet Take 1 tablet (25 mg total) by mouth daily. 30 tablet 5  . traZODone (DESYREL) 100 MG tablet Take 1 tablet (100 mg total) by mouth at bedtime. 30 tablet 5   No facility-administered medications prior to visit.    ROS Review of Systems  Constitutional: Negative for fever and chills.  Eyes: Negative for visual disturbance.  Respiratory: Negative for shortness of breath.   Cardiovascular: Negative for chest pain.  Gastrointestinal: Negative for abdominal pain and blood in stool.  Musculoskeletal: Positive for arthralgias (R great toe pain ). Negative for back pain.  Skin: Negative for rash.  Allergic/Immunologic: Negative for immunocompromised state.  Hematological: Negative for adenopathy. Does not bruise/bleed easily.  Psychiatric/Behavioral: Negative for suicidal ideas and dysphoric mood.    Objective:  BP 136/80 mmHg  Pulse 60  Temp(Src) 98.4 F (36.9 C) (Oral)  Resp 13  Ht 5\' 3"  (1.6 m)  Wt 186 lb (84.369 kg)  BMI  32.96 kg/m2  SpO2 100%  LMP 01/29/2013  BP/Weight 06/25/2015 05/23/2015 0000000  Systolic BP [id number] 123456 123456  Diastolic BP 80 62 72  Wt. (Lbs) 186 194 -  BMI 32.96 34.37 -    Physical Exam  Constitutional: She is oriented to person, place, and time. She appears well-developed and well-nourished. No distress.  HENT:  Head: Normocephalic and atraumatic.  Cardiovascular: Normal rate, regular rhythm, normal heart sounds and intact distal pulses.   Pulmonary/Chest: Effort normal and breath sounds normal.  Musculoskeletal: She exhibits no edema.       Feet:  Neurological: She is alert and oriented to person, place, and time.  Skin: Skin is warm and dry. No rash noted.  Psychiatric: She has a normal mood and affect.     Assessment & Plan:   There are no diagnoses linked to this encounter. Brittany Jimenez was seen today for results, toe pain and medication reaction.  Diagnoses and all orders for this visit:  Pain of right great toe -     Uric Acid -     Ambulatory referral to Podiatry -     indomethacin (INDOCIN) 25 MG capsule; Take 1 capsule (25 mg total) by mouth 3 (three) times daily with meals. -     acetaminophen-codeine (TYLENOL #3) 300-30 MG tablet; Take 1 tablet by mouth every 8 (eight) hours as needed for moderate pain.  Healthcare maintenance -     Ambulatory referral to Gastroenterology   Meds  ordered this encounter  Medications  . indomethacin (INDOCIN) 25 MG capsule    Sig: Take 1 capsule (25 mg total) by mouth 3 (three) times daily with meals.    Dispense:  15 capsule    Refill:  0  . acetaminophen-codeine (TYLENOL #3) 300-30 MG tablet    Sig: Take 1 tablet by mouth every 8 (eight) hours as needed for moderate pain.    Dispense:  20 tablet    Refill:  0    Follow-up: No Follow-up on file.   Boykin Nearing MD

## 2015-06-25 NOTE — Assessment & Plan Note (Signed)
Pain, mild swelling, slight ingrowing of toenail  Podiatry referral Check uric acid Indocin x 5 days Tylenol #3

## 2015-06-25 NOTE — Progress Notes (Signed)
AF/U results from Dr Posey Pronto  Stated had reaction to calcium, vomiting Rt toe swelling, painful and numbness Pain scale# 9 Former smoker- no cigarette x 3 weeks

## 2015-06-25 NOTE — Patient Instructions (Addendum)
Brittany Jimenez was seen today for results, toe pain and medication reaction.  Diagnoses and all orders for this visit:  Pain of right great toe -     Uric Acid -     Ambulatory referral to Podiatry -     indomethacin (INDOCIN) 25 MG capsule; Take 1 capsule (25 mg total) by mouth 3 (three) times daily with meals. -     acetaminophen-codeine (TYLENOL #3) 300-30 MG tablet; Take 1 tablet by mouth every 8 (eight) hours as needed for moderate pain.  Healthcare maintenance -     Ambulatory referral to Gastroenterology   Replace calcium with chewable calcium, aim for 800 mg daily   F/u in 4 weeks for pap  Dr. Adrian Blackwater

## 2015-06-26 LAB — URIC ACID: URIC ACID, SERUM: 3.9 mg/dL (ref 2.5–7.0)

## 2015-06-28 ENCOUNTER — Encounter: Payer: Self-pay | Admitting: Internal Medicine

## 2015-07-04 ENCOUNTER — Telehealth: Payer: Self-pay | Admitting: *Deleted

## 2015-07-04 NOTE — Telephone Encounter (Signed)
-----   Message from Boykin Nearing, MD sent at 06/26/2015 11:56 AM EDT ----- Normal uric acid Gout is unlikely F/u with podiatry for toe pain

## 2015-07-04 NOTE — Telephone Encounter (Signed)
Date of birth verified by pt  Normal uric acid  Pt has podiatry appointment

## 2015-07-23 ENCOUNTER — Encounter: Payer: Self-pay | Admitting: Family Medicine

## 2015-07-23 ENCOUNTER — Ambulatory Visit
Admission: RE | Admit: 2015-07-23 | Discharge: 2015-07-23 | Disposition: A | Discharge status: Home / Self Care | Payer: Medicare Other | Source: Ambulatory Visit | Attending: Family Medicine | Admitting: Family Medicine

## 2015-07-23 DIAGNOSIS — R928 Other abnormal and inconclusive findings on diagnostic imaging of breast: Secondary | ICD-10-CM | POA: Insufficient documentation

## 2015-07-23 DIAGNOSIS — Z0000 Encounter for general adult medical examination without abnormal findings: Secondary | ICD-10-CM

## 2015-07-23 DIAGNOSIS — Z1231 Encounter for screening mammogram for malignant neoplasm of breast: Secondary | ICD-10-CM | POA: Diagnosis not present

## 2015-07-24 ENCOUNTER — Ambulatory Visit (INDEPENDENT_AMBULATORY_CARE_PROVIDER_SITE_OTHER): Payer: Medicare Other | Admitting: Sports Medicine

## 2015-07-24 ENCOUNTER — Ambulatory Visit (INDEPENDENT_AMBULATORY_CARE_PROVIDER_SITE_OTHER): Payer: Medicare Other

## 2015-07-24 ENCOUNTER — Encounter: Payer: Self-pay | Admitting: Sports Medicine

## 2015-07-24 DIAGNOSIS — M205X9 Other deformities of toe(s) (acquired), unspecified foot: Secondary | ICD-10-CM

## 2015-07-24 DIAGNOSIS — M779 Enthesopathy, unspecified: Secondary | ICD-10-CM

## 2015-07-24 DIAGNOSIS — M79671 Pain in right foot: Secondary | ICD-10-CM | POA: Diagnosis not present

## 2015-07-24 DIAGNOSIS — M21619 Bunion of unspecified foot: Secondary | ICD-10-CM | POA: Diagnosis not present

## 2015-07-24 DIAGNOSIS — I639 Cerebral infarction, unspecified: Secondary | ICD-10-CM

## 2015-07-24 MED ORDER — MELOXICAM 15 MG PO TABS: 15.0000 mg | ORAL_TABLET | Freq: Every day | ORAL | Status: DC

## 2015-07-24 MED ORDER — METHYLPREDNISOLONE 4 MG PO TBPK: ORAL_TABLET | ORAL | Status: DC

## 2015-07-24 MED FILL — METHYLPREDNISOLONE 4 MG TAB: 4 | 6 days supply | Qty: 21 | Fill #0

## 2015-07-24 MED FILL — MELOXICAM 15 MG TABLET: 15 | 30 days supply | Qty: 30 | Fill #0

## 2015-07-24 MED FILL — traZODone HCL 100 MG TABS: 100 | 30 days supply | Qty: 30 | Fill #3

## 2015-07-24 NOTE — Progress Notes (Signed)
Patient ID: Brittany Jimenez, female   DOB: 03/19/1965, 50 y.o.   MRN: [id number] Subjective: Brittany Jimenez is a 50 y.o. female patient who presents to office for evaluation of right foot pain. Patient complains of progressive pain especially over the last 6 months. Reports PCP checked her for Diabetes and gout and all test have been negative. Patient has tried ice and indocin with no relief in symptoms. Patient denies any other pedal complaints. Denies injury/trip/fall/sprain/any causative factors.   Patient Active Problem List   Diagnosis Date Noted  . Abnormal screening mammogram 07/23/2015  . Pain of right great toe 06/25/2015  . Chronic ischemic multifocal multiple vascular territories stroke 03/05/2015  . Remote history of stroke 02/19/2015  . Hypertrophy of nasal turbinates 02/19/2015  . Left homonymous hemianopsia 02/08/2015  . Paresthesia of right foot 02/08/2015  . Numbness of foot 12/06/2014  . History of gout 11/01/2013  . Chronic pain of left knee 09/13/2013  . Anxiety 08/09/2013  . Arthralgia of both knees 08/09/2013  . Insomnia 08/09/2013  . MDD (major depressive disorder) ([place]) 07/22/2013  . Smoking 03/15/2013  . DUB (dysfunctional uterine bleeding) 06/29/2012  . Pain in joint, ankle and foot 06/21/2012  . Pain in left knee 04/28/2012  . Arthritis 01/09/2012  . Asthma, mild intermittent 01/09/2012  . Hypertension, essential, benign 01/09/2012  . Chronic pain     Current Outpatient Prescriptions on File Prior to Visit  Medication Sig Dispense Refill  . acetaminophen-codeine (TYLENOL #3) 300-30 MG tablet Take 1 tablet by mouth every 8 (eight) hours as needed for moderate pain. 20 tablet 0  . albuterol (PROVENTIL HFA;VENTOLIN HFA) 108 (90 BASE) MCG/ACT inhaler Inhale 2 puffs into the lungs every 6 (six) hours as needed. For shortness of breath 1 Inhaler 1 year  . aspirin 81 MG tablet Take 1 tablet (81 mg total) by mouth daily. 30 tablet 11  . busPIRone  (BUSPAR) 5 MG tablet Take 1 tablet (5 mg total) by mouth 2 (two) times daily. 60 tablet 5  . hydrochlorothiazide (HYDRODIURIL) 25 MG tablet Take 1 tablet (25 mg total) by mouth daily. 30 tablet 5  . indomethacin (INDOCIN) 25 MG capsule Take 1 capsule (25 mg total) by mouth 3 (three) times daily with meals. 15 capsule 0  . traZODone (DESYREL) 100 MG tablet Take 1 tablet (100 mg total) by mouth at bedtime. 30 tablet 5   No current facility-administered medications on file prior to visit.    No Known Allergies  Objective:  General: Alert and oriented x3 in no acute distress  Dermatology: No open lesions bilateral lower extremities, no webspace macerations, no ecchymosis bilateral, all nails x 10 are well manicured.  Vascular: Dorsalis Pedis and Posterior Tibial pedal pulses palpable, Capillary Fill Time 3 seconds,(+) pedal hair growth bilateral, no edema bilateral lower extremities, Temperature gradient within normal limits.  Neurology: Johney Maine sensation intact via light touch bilateral, Protective sensation intact  with Thornell Mule Monofilament to all pedal sites, Position sense intact, vibratory intact bilateral, Deep tendon reflexes within normal limits bilateral, No babinski sign present bilateral. (- )Tinels sign bilateral.   Musculoskeletal: Moderated tenderness with palpation at hallux IPJ, right, No pain with calf compression bilateral. There is decreased ankle rom with knee extending  vs flexed resembling gastroc equnius bilateral, Subtalar joint range of motion is within normal limits, there is no 1st ray hypermobility noted bilateral, decreased 1st MPJ rom Right>Left with functional limitus, bunion with hallux extensus noted on weightbearing exam. Strength within normal limits  in all groups bilateral.   Xrays  Right Foot   Impression:Normal osseous mineralization, there is hallux IPJ spurs suggestive of arthrtitis with significant bunion deformity, no fracture, no dislocation, soft  tissue swelling within normal limits. No foreign body.  Assessment and Plan: Problem List Items Addressed This Visit    None    Visit Diagnoses    Right foot pain    -  Primary    Relevant Medications    methylPREDNISolone (MEDROL DOSEPAK) 4 MG TBPK tablet    meloxicam (MOBIC) 15 MG tablet    Other Relevant Orders    DG Foot 2 Views Right    Capsulitis        Relevant Medications    methylPREDNISolone (MEDROL DOSEPAK) 4 MG TBPK tablet    meloxicam (MOBIC) 15 MG tablet    Bunion        Relevant Medications    meloxicam (MOBIC) 15 MG tablet    Hallux extensus, acquired, unspecified laterality        Relevant Medications    meloxicam (MOBIC) 15 MG tablet       -Complete examination performed -Xrays reviewed -Discussed treatement options for IPJ arthritis and capuslitis secondary to foot deformity -After oral consent and aseptic prep, injected a mixture containing 1 ml of 2%  plain lidocaine, 1 ml 0.5% plain marcaine, 0.5 ml of kenalog 10 and 0.5 ml of dexamethasone phosphate into right hallux IPJ medial approach without complication. Post-injection care discussed with patient.  -Rx Medrol dose pack and Mobic to start once completed -Gave foam to pad to wear as instructed -Recommend rest, ice, epsom salt soaks as needed -Recommend good supportive shoes -Patient to return to office in 1 month or sooner if condition worsens.  Landis Martins, DPM

## 2015-07-25 ENCOUNTER — Other Ambulatory Visit: Payer: Self-pay | Admitting: Family Medicine

## 2015-07-25 DIAGNOSIS — R928 Other abnormal and inconclusive findings on diagnostic imaging of breast: Secondary | ICD-10-CM

## 2015-08-01 ENCOUNTER — Ambulatory Visit
Admission: RE | Admit: 2015-08-01 | Discharge: 2015-08-01 | Disposition: A | Discharge status: Home / Self Care | Payer: Medicare Other | Source: Ambulatory Visit | Attending: Family Medicine | Admitting: Family Medicine

## 2015-08-01 DIAGNOSIS — R928 Other abnormal and inconclusive findings on diagnostic imaging of breast: Secondary | ICD-10-CM | POA: Diagnosis not present

## 2015-08-01 DIAGNOSIS — N6489 Other specified disorders of breast: Secondary | ICD-10-CM | POA: Diagnosis not present

## 2015-08-21 ENCOUNTER — Ambulatory Visit: Payer: Medicare Other | Admitting: Sports Medicine

## 2015-08-21 MED FILL — HYDROCHLOROTHIAZIDE 25 MG T: 25 | 30 days supply | Qty: 30 | Fill #3

## 2015-08-21 MED FILL — ?BUSPIRONE HCL 5 MG TABLET: 5 | 30 days supply | Qty: 60 | Fill #2

## 2015-08-21 MED FILL — traZODone HCL 100 MG TABS: 100 | 30 days supply | Qty: 30 | Fill #4

## 2015-09-07 ENCOUNTER — Encounter: Payer: Medicare Other | Admitting: Internal Medicine

## 2015-09-28 MED FILL — traZODone HCL 100 MG TABS: 100 | 30 days supply | Qty: 30 | Fill #5

## 2015-11-08 ENCOUNTER — Ambulatory Visit: Payer: Medicare Other | Attending: Family Medicine | Admitting: Family Medicine

## 2015-11-08 VITALS — BP 112/77 | HR 75 | Temp 98.6°F | Resp 20 | Wt 176.0 lb

## 2015-11-08 DIAGNOSIS — G4700 Insomnia, unspecified: Secondary | ICD-10-CM | POA: Diagnosis not present

## 2015-11-08 DIAGNOSIS — F17210 Nicotine dependence, cigarettes, uncomplicated: Secondary | ICD-10-CM | POA: Insufficient documentation

## 2015-11-08 DIAGNOSIS — J4520 Mild intermittent asthma, uncomplicated: Secondary | ICD-10-CM | POA: Diagnosis not present

## 2015-11-08 DIAGNOSIS — F332 Major depressive disorder, recurrent severe without psychotic features: Secondary | ICD-10-CM

## 2015-11-08 DIAGNOSIS — F419 Anxiety disorder, unspecified: Secondary | ICD-10-CM | POA: Diagnosis not present

## 2015-11-08 DIAGNOSIS — M79674 Pain in right toe(s): Secondary | ICD-10-CM

## 2015-11-08 DIAGNOSIS — Z79899 Other long term (current) drug therapy: Secondary | ICD-10-CM | POA: Insufficient documentation

## 2015-11-08 DIAGNOSIS — Z7982 Long term (current) use of aspirin: Secondary | ICD-10-CM | POA: Insufficient documentation

## 2015-11-08 DIAGNOSIS — I639 Cerebral infarction, unspecified: Secondary | ICD-10-CM

## 2015-11-08 MED ORDER — ACETAMINOPHEN-CODEINE #4 300-60 MG PO TABS: 1.0000 | ORAL_TABLET | ORAL | 1 refills | Status: DC | PRN

## 2015-11-08 MED ORDER — BUSPIRONE HCL 5 MG PO TABS: 5.0000 mg | ORAL_TABLET | Freq: Two times a day (BID) | ORAL | 5 refills | Status: DC

## 2015-11-08 MED ORDER — TRAZODONE HCL 100 MG PO TABS: 100.0000 mg | ORAL_TABLET | Freq: Every day | ORAL | 5 refills | Status: DC

## 2015-11-08 MED ORDER — ALBUTEROL SULFATE HFA 108 (90 BASE) MCG/ACT IN AERS: 2.0000 | INHALATION_SPRAY | Freq: Four times a day (QID) | RESPIRATORY_TRACT | 5 refills | Status: DC | PRN

## 2015-11-08 MED FILL — ACETAMINOPHEN/COD #4 TABLET: 300-60 | 5 days supply | Qty: 30 | Fill #0

## 2015-11-08 NOTE — Patient Instructions (Addendum)
Diagnoses and all orders for this visit:  Mild intermittent asthma without complication -     albuterol (PROVENTIL HFA;VENTOLIN HFA) 108 (90 Base) MCG/ACT inhaler; Inhale 2 puffs into the lungs every 6 (six) hours as needed. For shortness of breath  Insomnia, unspecified type -     traZODone (DESYREL) 100 MG tablet; Take 1 tablet (100 mg total) by mouth at bedtime.  Anxiety -     busPIRone (BUSPAR) 5 MG tablet; Take 1 tablet (5 mg total) by mouth 2 (two) times daily.  Severe episode of recurrent major depressive disorder, without psychotic features ([place]) -     busPIRone (BUSPAR) 5 MG tablet; Take 1 tablet (5 mg total) by mouth 2 (two) times daily.  Pain of right great toe -     acetaminophen-codeine (TYLENOL #4) 300-60 MG tablet; Take 1 tablet by mouth every 4 (four) hours as needed for moderate pain.    F/u in 4 weeks for pap smear  Dr. Adrian Blackwater    Colonoscopy A colonoscopy is an exam to look at the entire large intestine (colon). This exam can help find problems such as tumors, polyps, inflammation, and areas of bleeding. The exam takes about 1 hour.  LET [facility] CARE PROVIDER KNOW ABOUT:   Any allergies you have.  All medicines you are taking, including vitamins, herbs, eye drops, creams, and over-the-counter medicines.  Previous problems you or members of your family have had with the use of anesthetics.  Any blood disorders you have.  Previous surgeries you have had.  Medical conditions you have. RISKS AND COMPLICATIONS  Generally, this is a safe procedure. However, as with any procedure, complications can occur. Possible complications include:  Bleeding.  Tearing or rupture of the colon wall.  Reaction to medicines given during the exam.  Infection (rare). BEFORE THE PROCEDURE   Ask your health care provider about changing or stopping your regular medicines.  You may be prescribed an oral bowel prep. This involves drinking a large amount of medicated  liquid, starting the day before your procedure. The liquid will cause you to have multiple loose stools until your stool is almost clear or light green. This cleans out your colon in preparation for the procedure.  Do not eat or drink anything else once you have started the bowel prep, unless your health care provider tells you it is safe to do so.  Arrange for someone to drive you home after the procedure. PROCEDURE   You will be given medicine to help you relax (sedative).  You will lie on your side with your knees bent.  A long, flexible tube with a light and camera on the end (colonoscope) will be inserted through the rectum and into the colon. The camera sends video back to a computer screen as it moves through the colon. The colonoscope also releases carbon dioxide gas to inflate the colon. This helps your health care provider see the area better.  During the exam, your health care provider may take a small tissue sample (biopsy) to be examined under a microscope if any abnormalities are found.  The exam is finished when the entire colon has been viewed. AFTER THE PROCEDURE   Do not drive for 24 hours after the exam.  You may have a small amount of blood in your stool.  You may pass moderate amounts of gas and have mild abdominal cramping or bloating. This is caused by the gas used to inflate your colon during the exam.  Ask  when your test results will be ready and how you will get your results. Make sure you get your test results.   This information is not intended to replace advice given to you by your health care provider. Make sure you discuss any questions you have with your health care provider.   Document Released: 12/28/1999 Document Revised: 10/20/2012 Document Reviewed: 09/06/2012 Elsevier Interactive Patient Education [employer].

## 2015-11-08 NOTE — Progress Notes (Signed)
Refill sleeping pill and anxiety medication; inhaler too. F/u right foot pain

## 2015-11-08 NOTE — Progress Notes (Signed)
Subjective:  Patient ID: Brittany Jimenez, female    DOB: [birth year]  Age: 50 y.o. MRN: [id number]  CC: Toe Pain   HPI Brittany Jimenez presents for   1. R great toe pain: for past month with mild swelling and numbness. No injury. No fever or chills.   Social History  Substance Use Topics  . Smoking status: Current Every Day Smoker    Packs/day: 0.25    Years: 35.00    Types: Cigarettes  . Smokeless tobacco: Never Used  . Alcohol use No    Outpatient Medications Prior to Visit  Medication Sig Dispense Refill  . acetaminophen-codeine (TYLENOL #3) 300-30 MG tablet Take 1 tablet by mouth every 8 (eight) hours as needed for moderate pain. 20 tablet 0  . albuterol (PROVENTIL HFA;VENTOLIN HFA) 108 (90 BASE) MCG/ACT inhaler Inhale 2 puffs into the lungs every 6 (six) hours as needed. For shortness of breath 1 Inhaler 1 year  . aspirin 81 MG tablet Take 1 tablet (81 mg total) by mouth daily. 30 tablet 11  . busPIRone (BUSPAR) 5 MG tablet Take 1 tablet (5 mg total) by mouth 2 (two) times daily. 60 tablet 5  . hydrochlorothiazide (HYDRODIURIL) 25 MG tablet Take 1 tablet (25 mg total) by mouth daily. 30 tablet 5  . traZODone (DESYREL) 100 MG tablet Take 1 tablet (100 mg total) by mouth at bedtime. 30 tablet 5  . indomethacin (INDOCIN) 25 MG capsule Take 1 capsule (25 mg total) by mouth 3 (three) times daily with meals. (Patient not taking: Reported on 11/08/2015) 15 capsule 0  . meloxicam (MOBIC) 15 MG tablet Take 1 tablet (15 mg total) by mouth daily. (Patient not taking: Reported on 11/08/2015) 30 tablet 0  . methylPREDNISolone (MEDROL DOSEPAK) 4 MG TBPK tablet Take 1st as instructed (Patient not taking: Reported on 11/08/2015) 21 tablet 0   No facility-administered medications prior to visit.     ROS Review of Systems  Constitutional: Negative for chills and fever.  Eyes: Negative for visual disturbance.  Respiratory: Negative for shortness of breath.   Cardiovascular:  Negative for chest pain.  Gastrointestinal: Negative for abdominal pain and blood in stool.  Musculoskeletal: Positive for arthralgias (R great toe pain ). Negative for back pain.  Skin: Negative for rash.  Allergic/Immunologic: Negative for immunocompromised state.  Hematological: Negative for adenopathy. Does not bruise/bleed easily.  Psychiatric/Behavioral: Negative for dysphoric mood and suicidal ideas.    Objective:  BP 112/77   Pulse 75   Temp 98.6 F (37 C) (Oral)   Resp 20   Wt 176 lb (79.8 kg)   LMP 01/29/2013   SpO2 100%   BMI 31.18 kg/m   BP/Weight 11/08/2015 06/25/2015 [id number]  Systolic BP XX123456 [id number] [id number]  Diastolic BP 77 80 62  Wt. (Lbs) 176 186 194  BMI 31.18 32.96 34.37  Some encounter information is confidential and restricted. Go to Review Flowsheets activity to see all data.    Physical Exam  Constitutional: She is oriented to person, place, and time. She appears well-developed and well-nourished. No distress.  HENT:  Head: Normocephalic and atraumatic.  Cardiovascular: Normal rate, regular rhythm, normal heart sounds and intact distal pulses.   Pulmonary/Chest: Effort normal and breath sounds normal.  Musculoskeletal: She exhibits no edema.       Feet:  Neurological: She is alert and oriented to person, place, and time.  Skin: Skin is warm and dry. No rash noted.  Psychiatric: She has a normal mood and affect.  Assessment & Plan:   There are no diagnoses linked to this encounter. Brittany Jimenez was seen today for toe pain.  Diagnoses and all orders for this visit:  Mild intermittent asthma without complication -     albuterol (PROVENTIL HFA;VENTOLIN HFA) 108 (90 Base) MCG/ACT inhaler; Inhale 2 puffs into the lungs every 6 (six) hours as needed. For shortness of breath  Insomnia, unspecified type -     traZODone (DESYREL) 100 MG tablet; Take 1 tablet (100 mg total) by mouth at bedtime.  Anxiety -     busPIRone (BUSPAR) 5 MG tablet; Take 1 tablet (5  mg total) by mouth 2 (two) times daily.  Severe episode of recurrent major depressive disorder, without psychotic features ([place]) -     busPIRone (BUSPAR) 5 MG tablet; Take 1 tablet (5 mg total) by mouth 2 (two) times daily.  Pain of right great toe -     acetaminophen-codeine (TYLENOL #4) 300-60 MG tablet; Take 1 tablet by mouth every 4 (four) hours as needed for moderate pain.   No orders of the defined types were placed in this encounter.   Follow-up: No Follow-up on file.   Boykin Nearing MD

## 2015-11-10 ENCOUNTER — Encounter: Payer: Self-pay | Admitting: Family Medicine

## 2015-11-10 NOTE — Assessment & Plan Note (Signed)
Persistent toe pain consistent with OA Normal uric acid on last check  Plan: Tylenol #3 for pain control

## 2015-11-20 ENCOUNTER — Ambulatory Visit (INDEPENDENT_AMBULATORY_CARE_PROVIDER_SITE_OTHER): Payer: Medicare Other

## 2015-11-20 ENCOUNTER — Encounter: Payer: Self-pay | Admitting: Sports Medicine

## 2015-11-20 ENCOUNTER — Ambulatory Visit (INDEPENDENT_AMBULATORY_CARE_PROVIDER_SITE_OTHER): Payer: Medicare Other | Admitting: Sports Medicine

## 2015-11-20 VITALS — BP 139/84 | HR 73 | Resp 16

## 2015-11-20 DIAGNOSIS — M21619 Bunion of unspecified foot: Secondary | ICD-10-CM

## 2015-11-20 DIAGNOSIS — I639 Cerebral infarction, unspecified: Secondary | ICD-10-CM

## 2015-11-20 DIAGNOSIS — M2010 Hallux valgus (acquired), unspecified foot: Secondary | ICD-10-CM

## 2015-11-20 DIAGNOSIS — Z01818 Encounter for other preprocedural examination: Secondary | ICD-10-CM

## 2015-11-20 DIAGNOSIS — M79671 Pain in right foot: Secondary | ICD-10-CM

## 2015-11-20 DIAGNOSIS — M779 Enthesopathy, unspecified: Secondary | ICD-10-CM

## 2015-11-20 DIAGNOSIS — M79672 Pain in left foot: Principal | ICD-10-CM

## 2015-11-20 DIAGNOSIS — M7750 Other enthesopathy of unspecified foot: Secondary | ICD-10-CM

## 2015-11-20 NOTE — Progress Notes (Signed)
Patient ID: Brittany Jimenez, female   DOB: 1965-12-04, 50 y.o.   MRN: [id number] Subjective: Brittany Jimenez is a 50 y.o. female patient who returns to office to office to discuss treatment and surgery for R>L bunion pain. Patient has tried padding, injection, medication, change in shoes with no relief. Patient states that she saw her PCP who gave her Tylenol with codeine for the pain. Patient desires surgery.  Patient Active Problem List   Diagnosis Date Noted  . Abnormal screening mammogram 07/23/2015  . Pain of right great toe 06/25/2015  . Chronic ischemic multifocal multiple vascular territories stroke 03/05/2015  . Remote history of stroke 02/19/2015  . Hypertrophy of nasal turbinates 02/19/2015  . Left homonymous hemianopsia 02/08/2015  . Paresthesia of right foot 02/08/2015  . Numbness of foot 12/06/2014  . History of gout 11/01/2013  . Chronic pain of left knee 09/13/2013  . Anxiety 08/09/2013  . Arthralgia of both knees 08/09/2013  . Insomnia 08/09/2013  . MDD (major depressive disorder) ([place]) 07/22/2013  . Smoking 03/15/2013  . DUB (dysfunctional uterine bleeding) 06/29/2012  . Pain in joint, ankle and foot 06/21/2012  . Pain in left knee 04/28/2012  . Arthritis 01/09/2012  . Asthma, mild intermittent 01/09/2012  . Hypertension, essential, benign 01/09/2012  . Chronic pain     Current Outpatient Prescriptions on File Prior to Visit  Medication Sig Dispense Refill  . acetaminophen-codeine (TYLENOL #4) 300-60 MG tablet Take 1 tablet by mouth every 4 (four) hours as needed for moderate pain. 30 tablet 1  . albuterol (PROVENTIL HFA;VENTOLIN HFA) 108 (90 Base) MCG/ACT inhaler Inhale 2 puffs into the lungs every 6 (six) hours as needed. For shortness of breath 1 Inhaler 5  . aspirin 81 MG tablet Take 1 tablet (81 mg total) by mouth daily. 30 tablet 11  . busPIRone (BUSPAR) 5 MG tablet Take 1 tablet (5 mg total) by mouth 2 (two) times daily. 60 tablet 5  .  hydrochlorothiazide (HYDRODIURIL) 25 MG tablet Take 1 tablet (25 mg total) by mouth daily. 30 tablet 5  . traZODone (DESYREL) 100 MG tablet Take 1 tablet (100 mg total) by mouth at bedtime. 30 tablet 5   No current facility-administered medications on file prior to visit.     No Known Allergies  Social History   Social History  . Marital status: Married    Spouse name: N/A  . Number of children: N/A  . Years of education: N/A   Social History Main Topics  . Smoking status: Current Every Day Smoker    Packs/day: 0.25    Years: 35.00    Types: Cigarettes  . Smokeless tobacco: Never Used  . Alcohol use No  . Drug use: No  . Sexual activity: Yes    Birth control/ protection: Surgical   Other Topics Concern  . None   Social History Narrative   Lives in [place] with husband, nephew, and one daughter.  Applying for disability currently for depression and knee pain.     Used to work as a Educational psychologist at [employer].   Family History  Problem Relation Age of Onset  . Heart disease Mother   . Early death Mother   . Asthma Mother   . Diabetes Mother   . Hypertension Mother   . Cancer Father   . Alcohol abuse Father   . Early death Father   . Hypertension Father   . Asthma Sister   . Cancer Sister   . Hypertension Sister   .  Depression Brother   . Drug abuse Brother   . Hypertension Brother    Objective:  General: Alert and oriented x3 in no acute distress  Dermatology: No open lesions bilateral lower extremities, no webspace macerations, no ecchymosis bilateral, all nails x 10 are well manicured.  Vascular: Dorsalis Pedis and Posterior Tibial pedal pulses palpable, Capillary Fill Time 3 seconds,(+) pedal hair growth bilateral, no edema bilateral lower extremities, Temperature gradient within normal limits.  Neurology: Johney Maine sensation intact via light touch bilateral, Protective sensation intact  with Thornell Mule Monofilament to all pedal sites, Position sense  intact, vibratory intact bilateral, Deep tendon reflexes within normal limits bilateral, No babinski sign present bilateral. (- )Tinels sign bilateral.   Musculoskeletal: Moderated tenderness with palpation at bunion and hallux IPJ, right>left, No pain with calf compression bilateral. There is decreased ankle rom with knee extending  vs flexed resembling gastroc equnius bilateral, Subtalar joint range of motion is within normal limits, there is no 1st ray hypermobility noted bilateral, decreased 1st MPJ rom Right>Left with functional limitus, bunion with hallux extensus noted on weightbearing exam. Strength within normal limits in all groups bilateral.   Xrays  Right and left Foot   Impression:Normal osseous mineralization, there is mild hallux IPJ spurs suggestive of arthrtitis with significant bunion deformity with mild cysts in met head, no fracture, no dislocation, soft tissue swelling within normal limits. No foreign body.  Assessment and Plan: Problem List Items Addressed This Visit    None    Visit Diagnoses    Foot pain, bilateral    -  Primary   Relevant Orders   DG Foot 2 Views Right   DG Foot 2 Views Left   Bunion       Capsulitis       Preoperative examination         -Complete examination performed -Xrays reviewed -Discussed treatement options for painful bunion -Patient opt for surgical management. Consent obtained for Right bunionectomy with internal fixation. Pre and Post op course explained. Risks, benefits, alternatives explained. No guarantees given or implied. Surgical booking slip submitted and provided patient with Surgical packet and info for [facility] -PCP clearance requested -Dispensed CAM Walker to use post op and will also dispense crutches at surgery center to use to assist with ambulation -Patient to return to office after surgery or sooner if condition worsens.  Landis Martins, DPM

## 2015-11-20 NOTE — Patient Instructions (Signed)
Pre-Operative Instructions  Congratulations, you have decided to take an important step to improving your quality of life.  You can be assured that the doctors of [facility] will be with you every step of the way.  1. Plan to be at the surgery center/hospital at least 1 (one) hour prior to your scheduled time unless otherwise directed by the surgical center/hospital staff.  You must have a responsible adult accompany you, remain during the surgery and drive you home.  Make sure you have directions to the surgical center/hospital and know how to get there on time. 2. For hospital based surgery you will need to obtain a history and physical form from your family physician within 1 month prior to the date of surgery- we will give you a form for you primary physician.  3. We make every effort to accommodate the date you request for surgery.  There are however, times where surgery dates or times have to be moved.  We will contact you as soon as possible if a change in schedule is required.   4. No Aspirin/Ibuprofen for one week before surgery.  If you are on aspirin, any non-steroidal anti-inflammatory medications (Mobic, Aleve, Ibuprofen) you should stop taking it 7 days prior to your surgery.  You make take Tylenol  For pain prior to surgery.  5. Medications- If you are taking daily heart and blood pressure medications, seizure, reflux, allergy, asthma, anxiety, pain or diabetes medications, make sure the surgery center/hospital is aware before the day of surgery so they may notify you which medications to take or avoid the day of surgery. 6. No food or drink after midnight the night before surgery unless directed otherwise by surgical center/hospital staff. 7. No alcoholic beverages 24 hours prior to surgery.  No smoking 24 hours prior to or 24 hours after surgery. 8. Wear loose pants or shorts- loose enough to fit over bandages, boots, and casts. 9. No slip on shoes, sneakers are best. 10. Bring  your boot with you to the surgery center/hospital.  Also bring crutches or a walker if your physician has prescribed it for you.  If you do not have this equipment, it will be provided for you after surgery. 11. If you have not been contracted by the surgery center/hospital by the day before your surgery, call to confirm the date and time of your surgery. 12. Leave-time from work may vary depending on the type of surgery you have.  Appropriate arrangements should be made prior to surgery with your employer. 13. Prescriptions will be provided immediately following surgery by your doctor.  Have these filled as soon as possible after surgery and take the medication as directed. 14. Remove nail polish on the operative foot. 15. Wash the night before surgery.  The night before surgery wash the foot and leg well with the antibacterial soap provided and water paying special attention to beneath the toenails and in between the toes.  Rinse thoroughly with water and dry well with a towel.  Perform this wash unless told not to do so by your physician.  Enclosed: 1 Ice pack (please put in freezer the night before surgery)   1 Hibiclens skin cleaner   Pre-op Instructions  If you have any questions regarding the instructions, do not hesitate to call our office.  [place]: [address] [place], [place] [address] [phone]  [place]: [address] [place], [place] [address] [phone]  [place]: [address]  [place], [place] [address] [phone]   Dr.   Norman Regal DPM, Dr. Matthew Wagoner DPM, Dr. M. Todd Hyatt DPM, Dr. Avangelina Flight DPM 

## 2015-11-22 MED FILL — VENTOLIN HFA 90 MCG INHALER: 108 (90 BAS | 25 days supply | Qty: 18 | Fill #0

## 2015-11-23 ENCOUNTER — Ambulatory Visit: Payer: Medicare Other | Admitting: Neurology

## 2015-12-04 MED FILL — traZODone HCL 100 MG TABS: 100 | 30 days supply | Qty: 30 | Fill #0

## 2015-12-10 ENCOUNTER — Encounter: Payer: Self-pay | Admitting: Sports Medicine

## 2015-12-10 DIAGNOSIS — M21611 Bunion of right foot: Secondary | ICD-10-CM | POA: Diagnosis not present

## 2015-12-10 DIAGNOSIS — I10 Essential (primary) hypertension: Secondary | ICD-10-CM | POA: Diagnosis not present

## 2015-12-10 DIAGNOSIS — M25571 Pain in right ankle and joints of right foot: Secondary | ICD-10-CM | POA: Diagnosis not present

## 2015-12-10 DIAGNOSIS — M2011 Hallux valgus (acquired), right foot: Secondary | ICD-10-CM | POA: Diagnosis not present

## 2015-12-10 HISTORY — PX: BUNIONECTOMY: SHX129

## 2015-12-11 ENCOUNTER — Telehealth: Payer: Self-pay | Admitting: Sports Medicine

## 2015-12-11 NOTE — Telephone Encounter (Signed)
Post op check phone call made to patient. Patient did not answer. Left voicemail for patient to call back if there are any issues or problems. Patient to follow up as scheduled next week or sooner if problems or issues arise.  -Dr. Torah Pinnock 

## 2015-12-12 MED FILL — OXYCODONE-APAP 10-325: 10-325 | 14 days supply | Qty: 56 | Fill #0

## 2015-12-12 MED FILL — AMOX-CLAV 875-125 MG TABLET: 875-125 | 10 days supply | Qty: 20 | Fill #0

## 2015-12-18 ENCOUNTER — Ambulatory Visit (INDEPENDENT_AMBULATORY_CARE_PROVIDER_SITE_OTHER): Payer: Medicare Other

## 2015-12-18 ENCOUNTER — Encounter: Payer: Self-pay | Admitting: Sports Medicine

## 2015-12-18 ENCOUNTER — Ambulatory Visit (INDEPENDENT_AMBULATORY_CARE_PROVIDER_SITE_OTHER): Payer: Self-pay | Admitting: Sports Medicine

## 2015-12-18 DIAGNOSIS — M21619 Bunion of unspecified foot: Secondary | ICD-10-CM

## 2015-12-18 DIAGNOSIS — F17200 Nicotine dependence, unspecified, uncomplicated: Secondary | ICD-10-CM

## 2015-12-18 DIAGNOSIS — M79671 Pain in right foot: Secondary | ICD-10-CM | POA: Diagnosis not present

## 2015-12-18 DIAGNOSIS — Z9889 Other specified postprocedural states: Secondary | ICD-10-CM

## 2015-12-18 MED ORDER — OXYCODONE-ACETAMINOPHEN 10-325 MG PO TABS: 1.0000 | ORAL_TABLET | Freq: Four times a day (QID) | ORAL | 0 refills | Status: DC | PRN

## 2015-12-18 NOTE — Progress Notes (Signed)
Subjective: Brittany Jimenez is a 50 y.o. female patient seen today in office for POV #1 (DOS 12-10-15), S/P Right Austin Bunionectomy with screw fixation. Patient denies pain at surgical site states that when she does have pain it is well controlled with pain medication, denies calf pain, denies headache, chest pain, shortness of breath, nausea, vomiting, fever, or chills. Patient states that she wants to make sure she is taking meds correctly and has not had a bowel movement yet. No other issues noted.   +smoker  Patient Active Problem List   Diagnosis Date Noted  . Abnormal screening mammogram 07/23/2015  . Pain of right great toe 06/25/2015  . Chronic ischemic multifocal multiple vascular territories stroke 03/05/2015  . Remote history of stroke 02/19/2015  . Hypertrophy of nasal turbinates 02/19/2015  . Left homonymous hemianopsia 02/08/2015  . Paresthesia of right foot 02/08/2015  . Numbness of foot 12/06/2014  . History of gout 11/01/2013  . Chronic pain of left knee 09/13/2013  . Anxiety 08/09/2013  . Arthralgia of both knees 08/09/2013  . Insomnia 08/09/2013  . MDD (major depressive disorder) ([place]) 07/22/2013  . Smoking 03/15/2013  . DUB (dysfunctional uterine bleeding) 06/29/2012  . Pain in joint, ankle and foot 06/21/2012  . Pain in left knee 04/28/2012  . Arthritis 01/09/2012  . Asthma, mild intermittent 01/09/2012  . Hypertension, essential, benign 01/09/2012  . Chronic pain     Current Outpatient Prescriptions on File Prior to Visit  Medication Sig Dispense Refill  . acetaminophen-codeine (TYLENOL #4) 300-60 MG tablet Take 1 tablet by mouth every 4 (four) hours as needed for moderate pain. 30 tablet 1  . albuterol (PROVENTIL HFA;VENTOLIN HFA) 108 (90 Base) MCG/ACT inhaler Inhale 2 puffs into the lungs every 6 (six) hours as needed. For shortness of breath 1 Inhaler 5  . aspirin 81 MG tablet Take 1 tablet (81 mg total) by mouth daily. 30 tablet 11  . busPIRone  (BUSPAR) 5 MG tablet Take 1 tablet (5 mg total) by mouth 2 (two) times daily. 60 tablet 5  . hydrochlorothiazide (HYDRODIURIL) 25 MG tablet Take 1 tablet (25 mg total) by mouth daily. 30 tablet 5  . traZODone (DESYREL) 100 MG tablet Take 1 tablet (100 mg total) by mouth at bedtime. 30 tablet 5   No current facility-administered medications on file prior to visit.     No Known Allergies  Objective: There were no vitals filed for this visit.  General: No acute distress, AAOx3  Right foot: Sutures intact with no gapping or dehiscence at surgical site, mild swelling to right forefoot, no erythema, no warmth, no drainage, no signs of infection noted, Capillary fill time <3 seconds in all digits, gross sensation present via light touch to right foot. No pain or crepitation with range of motion right foot.  No pain with calf compression.   Post Op Xray, Right foot: 1st metatarsal in excellent alignment and position. Osteotomy site healing. Screw/Hardware intact. Soft tissue swelling within normal limits for post op status.   Assessment and Plan:  Problem List Items Addressed This Visit    None    Visit Diagnoses    S/P foot surgery, right    -  Primary   Relevant Medications   oxyCODONE-acetaminophen (PERCOCET) 10-325 MG tablet   Right foot pain       Relevant Medications   oxyCODONE-acetaminophen (PERCOCET) 10-325 MG tablet   Other Relevant Orders   DG Foot Complete Right   Smoker          -  Patient seen and evaluated -Xray reviewed -Applied dry sterile dressing to surgical site right foot secured with ACE wrap and stockinet  -Advised patient to make sure to keep dressings clean, dry, and intact to left/right surgical site, removing the ACE as needed  -Advised patient to continue with CAM boot and crutches, right foot   -Advised patient to limit activity to necessity  -Advised patient to ice and elevate as necessary  -Advised smoking cessation -Continue with PRN and Pain meds;  Refilled Percocet  -Will plan for suture removal at next office visit. In the meantime, patient to call office if any issues or problems arise.   Landis Martins, DPM

## 2015-12-24 ENCOUNTER — Other Ambulatory Visit: Payer: Self-pay | Admitting: Family Medicine

## 2015-12-24 DIAGNOSIS — N6310 Unspecified lump in the right breast, unspecified quadrant: Secondary | ICD-10-CM

## 2015-12-24 DIAGNOSIS — N6489 Other specified disorders of breast: Secondary | ICD-10-CM

## 2015-12-24 MED FILL — OXYCODONE-APAP 10-325: 10-325 | 8 days supply | Qty: 30 | Fill #0

## 2015-12-25 ENCOUNTER — Encounter: Payer: Self-pay | Admitting: Sports Medicine

## 2015-12-25 ENCOUNTER — Ambulatory Visit (INDEPENDENT_AMBULATORY_CARE_PROVIDER_SITE_OTHER): Payer: Medicare Other | Admitting: Sports Medicine

## 2015-12-25 VITALS — Temp 97.5°F

## 2015-12-25 DIAGNOSIS — M205X9 Other deformities of toe(s) (acquired), unspecified foot: Secondary | ICD-10-CM

## 2015-12-25 DIAGNOSIS — M79671 Pain in right foot: Secondary | ICD-10-CM

## 2015-12-25 DIAGNOSIS — Z9889 Other specified postprocedural states: Secondary | ICD-10-CM

## 2015-12-25 DIAGNOSIS — F17200 Nicotine dependence, unspecified, uncomplicated: Secondary | ICD-10-CM

## 2015-12-25 NOTE — Progress Notes (Signed)
Subjective: Brittany Jimenez is a 50 y.o. female patient seen today in office for POV #2 (DOS 12-10-15), S/P Right Austin Bunionectomy with screw fixation. Patient denies pain at surgical site states that when she does have pain it is well controlled with pain medication like before and reports that things are feeling good, denies calf pain, denies headache, chest pain, shortness of breath, nausea, vomiting, fever, or chills. No other issues noted.   +smoker  Patient Active Problem List   Diagnosis Date Noted  . Abnormal screening mammogram 07/23/2015  . Pain of right great toe 06/25/2015  . Chronic ischemic multifocal multiple vascular territories stroke 03/05/2015  . Remote history of stroke 02/19/2015  . Hypertrophy of nasal turbinates 02/19/2015  . Left homonymous hemianopsia 02/08/2015  . Paresthesia of right foot 02/08/2015  . Numbness of foot 12/06/2014  . History of gout 11/01/2013  . Chronic pain of left knee 09/13/2013  . Anxiety 08/09/2013  . Arthralgia of both knees 08/09/2013  . Insomnia 08/09/2013  . MDD (major depressive disorder) ([place]) 07/22/2013  . Smoking 03/15/2013  . DUB (dysfunctional uterine bleeding) 06/29/2012  . Pain in joint, ankle and foot 06/21/2012  . Pain in left knee 04/28/2012  . Arthritis 01/09/2012  . Asthma, mild intermittent 01/09/2012  . Hypertension, essential, benign 01/09/2012  . Chronic pain     Current Outpatient Prescriptions on File Prior to Visit  Medication Sig Dispense Refill  . acetaminophen-codeine (TYLENOL #4) 300-60 MG tablet Take 1 tablet by mouth every 4 (four) hours as needed for moderate pain. 30 tablet 1  . albuterol (PROVENTIL HFA;VENTOLIN HFA) 108 (90 Base) MCG/ACT inhaler Inhale 2 puffs into the lungs every 6 (six) hours as needed. For shortness of breath 1 Inhaler 5  . amoxicillin-clavulanate (AUGMENTIN) 875-125 MG tablet   0  . aspirin 81 MG tablet Take 1 tablet (81 mg total) by mouth daily. 30 tablet 11  .  busPIRone (BUSPAR) 5 MG tablet Take 1 tablet (5 mg total) by mouth 2 (two) times daily. 60 tablet 5  . hydrochlorothiazide (HYDRODIURIL) 25 MG tablet Take 1 tablet (25 mg total) by mouth daily. 30 tablet 5  . oxyCODONE-acetaminophen (PERCOCET) 10-325 MG tablet Take 1 tablet by mouth every 6 (six) hours as needed for pain. 30 tablet 0  . traZODone (DESYREL) 100 MG tablet Take 1 tablet (100 mg total) by mouth at bedtime. 30 tablet 5   No current facility-administered medications on file prior to visit.     No Known Allergies  Objective: There were no vitals filed for this visit.  General: No acute distress, AAOx3  Right foot: Sutures intact with no gapping or dehiscence at surgical site, mild swelling to right forefoot, no erythema, no warmth, no drainage, no signs of infection noted, Capillary fill time <3 seconds in all digits, gross sensation present via light touch to right foot. No pain or crepitation with range of motion right foot.  No pain with calf compression.   Assessment and Plan:  Problem List Items Addressed This Visit    None    Visit Diagnoses    Hallux extensus, acquired, unspecified laterality    -  Primary   S/P foot surgery, right       Right foot pain       Smoker          -Patient seen and evaluated -Sutures removed applied steristrips and stockinet  -Advised patient to shower as normal allowing strips to fall off and once they do  may start scar creams and gels -Advised patient to continue with CAM boot and crutches, right foot x2 weeks and thereafter slowly start to use normal shoes  -Advised patient to limit activity to necessity  -Advised patient to ice and elevate as necessary  -Advised smoking cessation -Continue with PRN and Pain meds -Return in 3 weeks for xray and to progress activities. In the meantime, patient to call office if any issues or problems arise.   Landis Martins, DPM

## 2016-01-09 MED FILL — traZODone HCL 100 MG TABS: 100 | 30 days supply | Qty: 30 | Fill #1

## 2016-01-10 ENCOUNTER — Other Ambulatory Visit (HOSPITAL_COMMUNITY)
Admission: RE | Admit: 2016-01-10 | Discharge: 2016-01-10 | Disposition: A | Discharge status: Home / Self Care | Payer: Medicare Other | Source: Ambulatory Visit | Attending: Family Medicine | Admitting: Family Medicine

## 2016-01-10 ENCOUNTER — Encounter: Payer: Self-pay | Admitting: Family Medicine

## 2016-01-10 ENCOUNTER — Ambulatory Visit: Payer: Medicare Other | Attending: Family Medicine | Admitting: Family Medicine

## 2016-01-10 VITALS — BP 146/86 | HR 75 | Temp 97.6°F | Ht 63.0 in | Wt 176.6 lb

## 2016-01-10 DIAGNOSIS — Z7982 Long term (current) use of aspirin: Secondary | ICD-10-CM | POA: Insufficient documentation

## 2016-01-10 DIAGNOSIS — Z124 Encounter for screening for malignant neoplasm of cervix: Secondary | ICD-10-CM

## 2016-01-10 DIAGNOSIS — Z01419 Encounter for gynecological examination (general) (routine) without abnormal findings: Secondary | ICD-10-CM | POA: Insufficient documentation

## 2016-01-10 DIAGNOSIS — Z1151 Encounter for screening for human papillomavirus (HPV): Secondary | ICD-10-CM | POA: Insufficient documentation

## 2016-01-10 DIAGNOSIS — Z79899 Other long term (current) drug therapy: Secondary | ICD-10-CM | POA: Insufficient documentation

## 2016-01-10 DIAGNOSIS — Z1272 Encounter for screening for malignant neoplasm of vagina: Secondary | ICD-10-CM | POA: Diagnosis not present

## 2016-01-10 DIAGNOSIS — I639 Cerebral infarction, unspecified: Secondary | ICD-10-CM | POA: Diagnosis not present

## 2016-01-10 DIAGNOSIS — Z0000 Encounter for general adult medical examination without abnormal findings: Secondary | ICD-10-CM

## 2016-01-10 DIAGNOSIS — F17210 Nicotine dependence, cigarettes, uncomplicated: Secondary | ICD-10-CM | POA: Diagnosis not present

## 2016-01-10 DIAGNOSIS — Z113 Encounter for screening for infections with a predominantly sexual mode of transmission: Secondary | ICD-10-CM | POA: Insufficient documentation

## 2016-01-10 NOTE — Patient Instructions (Addendum)
Brittany Jimenez was seen today for gynecologic exam.  Diagnoses and all orders for this visit:  Papanicolaou smear for cervical cancer screening -     Cytology - PAP  Healthcare maintenance -     Ambulatory referral to Gastroenterology  Smoking cessation support: smoking cessation hotline: 1-800-QUIT-NOW.  Smoking cessation classes are available through [facility]. Call [phone] or visit our website at [email or web address]   You will be called with pap results  F/u in 3 months for HTN   Dr. Adrian Blackwater

## 2016-01-10 NOTE — Progress Notes (Signed)
Subjective:  Patient ID: Brittany Jimenez, female    DOB: 07/29/1965  Age: 50 y.o. MRN: [id number]  CC: Gynecologic Exam   HPI Brittany Jimenez presents for    1. Pap: No hx of abnormal pap. No vaginal bleeding . Comfortable sex with her husband. No vaginal discharge or pelvic pain.   Social History  Substance Use Topics  . Smoking status: Current Every Day Smoker    Packs/day: 0.25    Years: 35.00    Types: Cigarettes  . Smokeless tobacco: Never Used  . Alcohol use No    Outpatient Medications Prior to Visit  Medication Sig Dispense Refill  . acetaminophen-codeine (TYLENOL #4) 300-60 MG tablet Take 1 tablet by mouth every 4 (four) hours as needed for moderate pain. 30 tablet 1  . albuterol (PROVENTIL HFA;VENTOLIN HFA) 108 (90 Base) MCG/ACT inhaler Inhale 2 puffs into the lungs every 6 (six) hours as needed. For shortness of breath 1 Inhaler 5  . amoxicillin-clavulanate (AUGMENTIN) 875-125 MG tablet   0  . aspirin 81 MG tablet Take 1 tablet (81 mg total) by mouth daily. 30 tablet 11  . busPIRone (BUSPAR) 5 MG tablet Take 1 tablet (5 mg total) by mouth 2 (two) times daily. 60 tablet 5  . hydrochlorothiazide (HYDRODIURIL) 25 MG tablet Take 1 tablet (25 mg total) by mouth daily. 30 tablet 5  . oxyCODONE-acetaminophen (PERCOCET) 10-325 MG tablet Take 1 tablet by mouth every 6 (six) hours as needed for pain. 30 tablet 0  . traZODone (DESYREL) 100 MG tablet Take 1 tablet (100 mg total) by mouth at bedtime. 30 tablet 5   No facility-administered medications prior to visit.     ROS Review of Systems  Constitutional: Negative for chills and fever.  Eyes: Negative for visual disturbance.  Respiratory: Negative for shortness of breath.   Cardiovascular: Negative for chest pain.  Gastrointestinal: Negative for abdominal pain and blood in stool.  Musculoskeletal: Positive for arthralgias (R great toe pain ). Negative for back pain.  Skin: Negative for rash.    Allergic/Immunologic: Negative for immunocompromised state.  Hematological: Negative for adenopathy. Does not bruise/bleed easily.  Psychiatric/Behavioral: Negative for dysphoric mood and suicidal ideas.    Objective:  BP (!) 146/86 (BP Location: Right Arm, Patient Position: Sitting, Cuff Size: Small)   Pulse 75   Temp 97.6 F (36.4 C) (Oral)   Ht 5\' 3"  (1.6 m)   Wt 176 lb 9.6 oz (80.1 kg)   LMP 01/29/2013   SpO2 98%   BMI 31.28 kg/m   BP/Weight 01/10/2016 11/20/2015 [id number]  Systolic BP 123456 [id number]  Diastolic BP 86 84 77  Wt. (Lbs) 176.6 - 176  BMI 31.28 - 31.18  Some encounter information is confidential and restricted. Go to Review Flowsheets activity to see all data.   Physical Exam  Constitutional: She appears well-developed and well-nourished. No distress.  Cardiovascular: Normal rate, regular rhythm, normal heart sounds and intact distal pulses.   Pulmonary/Chest: Effort normal and breath sounds normal.  Genitourinary: Vagina normal and uterus normal. Pelvic exam was performed with patient prone. There is no rash, tenderness or lesion on the right labia. There is no rash, tenderness or lesion on the left labia. Cervix exhibits no motion tenderness, no discharge and no friability.  Musculoskeletal: She exhibits no edema.  Lymphadenopathy:       Right: No inguinal adenopathy present.       Left: No inguinal adenopathy present.  Skin: Skin is warm and dry. No rash  noted.    Assessment & Plan:   Ly was seen today for gynecologic exam.  Diagnoses and all orders for this visit:  Papanicolaou smear for cervical cancer screening -     Cytology - PAP  Healthcare maintenance -     Ambulatory referral to Gastroenterology    No orders of the defined types were placed in this encounter.   Follow-up: Return in about 3 months (around 04/09/2016) for HTN .   Boykin Nearing MD

## 2016-01-10 NOTE — Progress Notes (Signed)
Pt is here today for pap smear. Pt needs refills on pain medication,pt also needs letter stating she is able to go back to work.

## 2016-01-11 ENCOUNTER — Telehealth: Payer: Self-pay | Admitting: Family Medicine

## 2016-01-11 DIAGNOSIS — A599 Trichomoniasis, unspecified: Secondary | ICD-10-CM

## 2016-01-11 LAB — CERVICOVAGINAL ANCILLARY ONLY: Wet Prep (BD Affirm): POSITIVE — AB

## 2016-01-11 MED ORDER — METRONIDAZOLE 500 MG PO TABS: 500.0000 mg | ORAL_TABLET | Freq: Two times a day (BID) | ORAL | 0 refills | Status: DC

## 2016-01-11 MED ORDER — FLUCONAZOLE 150 MG PO TABS: 150.0000 mg | ORAL_TABLET | ORAL | 0 refills | Status: DC

## 2016-01-11 NOTE — Telephone Encounter (Signed)
Called patient verified name and DOB Gave wet prep results BV and trich  Trich STI Patient and husband need to be treated Husband is patient here  Sent flagyl to preferred [facility] on [address] he come in next week for treatment  Patient and husband agreed with plan and voiced understanding

## 2016-01-15 ENCOUNTER — Encounter: Payer: Self-pay | Admitting: Sports Medicine

## 2016-01-15 ENCOUNTER — Ambulatory Visit (INDEPENDENT_AMBULATORY_CARE_PROVIDER_SITE_OTHER): Payer: Self-pay | Admitting: Sports Medicine

## 2016-01-15 ENCOUNTER — Ambulatory Visit (INDEPENDENT_AMBULATORY_CARE_PROVIDER_SITE_OTHER): Payer: Medicare Other

## 2016-01-15 DIAGNOSIS — M79671 Pain in right foot: Secondary | ICD-10-CM

## 2016-01-15 DIAGNOSIS — Z9889 Other specified postprocedural states: Secondary | ICD-10-CM

## 2016-01-15 DIAGNOSIS — M21619 Bunion of unspecified foot: Secondary | ICD-10-CM | POA: Diagnosis not present

## 2016-01-15 LAB — CYTOLOGY - PAP
CHLAMYDIA, DNA PROBE: NEGATIVE
Diagnosis: NEGATIVE
HPV: NOT DETECTED
NEISSERIA GONORRHEA: NEGATIVE

## 2016-01-15 MED ORDER — OXYCODONE-ACETAMINOPHEN 10-325 MG PO TABS: 1.0000 | ORAL_TABLET | Freq: Four times a day (QID) | ORAL | 0 refills | Status: DC | PRN

## 2016-01-15 MED FILL — OXYCODONE-APAP 10-325: 10-325 | 8 days supply | Qty: 30 | Fill #0

## 2016-01-15 NOTE — Progress Notes (Signed)
Subjective: Brittany Jimenez is a 51 y.o. female patient seen today in office for POV #3 (DOS 12-10-15), S/P Right Austin Bunionectomy with screw fixation. Patient denies pain at surgical site states that things are feeling good, has a little twinge of pain at end of day, denies calf pain, denies headache, chest pain, shortness of breath, nausea, vomiting, fever, or chills. No other issues noted.   +smoker  Patient Active Problem List   Diagnosis Date Noted  . Abnormal screening mammogram 07/23/2015  . Pain of right great toe 06/25/2015  . Chronic ischemic multifocal multiple vascular territories stroke 03/05/2015  . Remote history of stroke 02/19/2015  . Hypertrophy of nasal turbinates 02/19/2015  . Left homonymous hemianopsia 02/08/2015  . Paresthesia of right foot 02/08/2015  . Numbness of foot 12/06/2014  . History of gout 11/01/2013  . Chronic pain of left knee 09/13/2013  . Anxiety 08/09/2013  . Arthralgia of both knees 08/09/2013  . Insomnia 08/09/2013  . MDD (major depressive disorder) ([place]) 07/22/2013  . Smoking 03/15/2013  . DUB (dysfunctional uterine bleeding) 06/29/2012  . Pain in joint, ankle and foot 06/21/2012  . Pain in left knee 04/28/2012  . Arthritis 01/09/2012  . Asthma, mild intermittent 01/09/2012  . Hypertension, essential, benign 01/09/2012  . Chronic pain     Current Outpatient Prescriptions on File Prior to Visit  Medication Sig Dispense Refill  . acetaminophen-codeine (TYLENOL #4) 300-60 MG tablet Take 1 tablet by mouth every 4 (four) hours as needed for moderate pain. (Patient not taking: Reported on 01/10/2016) 30 tablet 1  . albuterol (PROVENTIL HFA;VENTOLIN HFA) 108 (90 Base) MCG/ACT inhaler Inhale 2 puffs into the lungs every 6 (six) hours as needed. For shortness of breath 1 Inhaler 5  . amoxicillin-clavulanate (AUGMENTIN) 875-125 MG tablet   0  . aspirin 81 MG tablet Take 1 tablet (81 mg total) by mouth daily. 30 tablet 11  . busPIRone  (BUSPAR) 5 MG tablet Take 1 tablet (5 mg total) by mouth 2 (two) times daily. 60 tablet 5  . fluconazole (DIFLUCAN) 150 MG tablet Take 1 tablet (150 mg total) by mouth every 3 (three) days. 2 tablet 0  . hydrochlorothiazide (HYDRODIURIL) 25 MG tablet Take 1 tablet (25 mg total) by mouth daily. 30 tablet 5  . metroNIDAZOLE (FLAGYL) 500 MG tablet Take 1 tablet (500 mg total) by mouth 2 (two) times daily. 14 tablet 0  . traZODone (DESYREL) 100 MG tablet Take 1 tablet (100 mg total) by mouth at bedtime. 30 tablet 5   No current facility-administered medications on file prior to visit.     No Known Allergies  Objective: There were no vitals filed for this visit.  General: No acute distress, AAOx3  Right foot: Incision healed with no gapping or dehiscence at surgical site, mild swelling to right forefoot, no erythema, no warmth, no drainage, no signs of infection noted, Capillary fill time <3 seconds in all digits, gross sensation present via light touch to right foot. No pain or crepitation with range of motion right foot.  No pain with calf compression.   Xrays: Osteotomy site healing with screw intact, no other acute findings.   Assessment and Plan:  Problem List Items Addressed This Visit    None    Visit Diagnoses    S/P foot surgery, right    -  Primary   Relevant Medications   oxyCODONE-acetaminophen (PERCOCET) 10-325 MG tablet   Other Relevant Orders   DG Foot Complete Right   Right  foot pain       Relevant Medications   oxyCODONE-acetaminophen (PERCOCET) 10-325 MG tablet   Other Relevant Orders   DG Foot Complete Right      -Patient seen and evaluated -Xrays reviewed -Continue  to use normal shoes  -Advised patient activities as tolerated; May return to work with no restrictions -Advised patient to ice and elevate as necessary  -Advised smoking cessation -Continue with PRN and Pain meds as needed -Return in 8 weeks. In the meantime, patient to call office if any issues  or problems arise.   Landis Martins, DPM

## 2016-01-17 ENCOUNTER — Telehealth: Payer: Self-pay

## 2016-01-17 ENCOUNTER — Encounter: Payer: Self-pay | Admitting: Sports Medicine

## 2016-01-17 NOTE — Telephone Encounter (Addendum)
Patient fully hipaa verified.  RN advised patient; but patient states she was already aware: Pap smear normal, no HPV. Repeat pap in 3 years  Trich noted, patient is aware of trich and need for treatment.   Refill request by patient for: Tylenol#4 advised will send to Dr. Adrian Blackwater for review. Also due to cost of $30.00 would like medications called in to [facility] changed to [facility].

## 2016-01-17 NOTE — Telephone Encounter (Signed)
Will route to PCP 

## 2016-01-21 ENCOUNTER — Encounter: Payer: Self-pay | Admitting: Gastroenterology

## 2016-01-22 ENCOUNTER — Ambulatory Visit
Admission: RE | Admit: 2016-01-22 | Discharge: 2016-01-22 | Disposition: A | Discharge status: Home / Self Care | Payer: Medicare Other | Source: Ambulatory Visit | Attending: Family Medicine | Admitting: Family Medicine

## 2016-01-22 ENCOUNTER — Telehealth: Payer: Self-pay

## 2016-01-22 DIAGNOSIS — N6489 Other specified disorders of breast: Secondary | ICD-10-CM

## 2016-01-22 DIAGNOSIS — I10 Essential (primary) hypertension: Secondary | ICD-10-CM

## 2016-01-22 DIAGNOSIS — R928 Other abnormal and inconclusive findings on diagnostic imaging of breast: Secondary | ICD-10-CM | POA: Diagnosis not present

## 2016-01-22 DIAGNOSIS — M79674 Pain in right toe(s): Secondary | ICD-10-CM

## 2016-01-22 DIAGNOSIS — F419 Anxiety disorder, unspecified: Secondary | ICD-10-CM

## 2016-01-22 DIAGNOSIS — F332 Major depressive disorder, recurrent severe without psychotic features: Secondary | ICD-10-CM

## 2016-01-22 MED ORDER — HYDROCHLOROTHIAZIDE 25 MG PO TABS: 25.0000 mg | ORAL_TABLET | Freq: Every day | ORAL | 5 refills | Status: DC

## 2016-01-22 MED ORDER — ACETAMINOPHEN-CODEINE #4 300-60 MG PO TABS: 1.0000 | ORAL_TABLET | ORAL | 0 refills | Status: DC | PRN

## 2016-01-22 MED ORDER — BUSPIRONE HCL 5 MG PO TABS: 5.0000 mg | ORAL_TABLET | Freq: Two times a day (BID) | ORAL | 5 refills | Status: DC

## 2016-01-22 NOTE — Addendum Note (Signed)
Addended by: Boykin Nearing on: 01/22/2016 05:14 PM   Modules accepted: Orders

## 2016-01-22 NOTE — Telephone Encounter (Signed)
Patient is requesting prescription refill for Tylenol #4.Marland KitchenMarland KitchenMarland KitchenMarland Kitchenpleae follow up

## 2016-01-22 NOTE — Telephone Encounter (Signed)
Pt. Called requesting a refill on Tylenol # 4. Please f/u °

## 2016-01-22 NOTE — Telephone Encounter (Signed)
Please call patient Rx for tylenol #4 ready for pick up She should not take as same time as she takes percocet

## 2016-01-22 NOTE — Telephone Encounter (Signed)
Will route to PCP 

## 2016-01-23 ENCOUNTER — Telehealth: Payer: Self-pay

## 2016-01-23 MED FILL — ACETAMINOPHEN/COD #4 TABLET: 300-60 | 5 days supply | Qty: 30 | Fill #0

## 2016-01-23 NOTE — Telephone Encounter (Signed)
Pt was called and informed of script being ready for pick up.

## 2016-02-08 ENCOUNTER — Encounter (HOSPITAL_COMMUNITY): Payer: Self-pay | Admitting: *Deleted

## 2016-02-08 ENCOUNTER — Emergency Department (HOSPITAL_COMMUNITY)
Admission: EM | Admit: 2016-02-08 | Discharge: 2016-02-08 | Disposition: A | Discharge status: Home / Self Care | Payer: Medicare Other | Attending: Physician Assistant | Admitting: Physician Assistant

## 2016-02-08 DIAGNOSIS — Z79899 Other long term (current) drug therapy: Secondary | ICD-10-CM | POA: Insufficient documentation

## 2016-02-08 DIAGNOSIS — I10 Essential (primary) hypertension: Secondary | ICD-10-CM | POA: Diagnosis not present

## 2016-02-08 DIAGNOSIS — M62830 Muscle spasm of back: Secondary | ICD-10-CM | POA: Diagnosis not present

## 2016-02-08 DIAGNOSIS — F17210 Nicotine dependence, cigarettes, uncomplicated: Secondary | ICD-10-CM | POA: Insufficient documentation

## 2016-02-08 DIAGNOSIS — M545 Low back pain: Secondary | ICD-10-CM | POA: Diagnosis present

## 2016-02-08 DIAGNOSIS — Z8673 Personal history of transient ischemic attack (TIA), and cerebral infarction without residual deficits: Secondary | ICD-10-CM | POA: Diagnosis not present

## 2016-02-08 DIAGNOSIS — Z7982 Long term (current) use of aspirin: Secondary | ICD-10-CM | POA: Diagnosis not present

## 2016-02-08 DIAGNOSIS — J45909 Unspecified asthma, uncomplicated: Secondary | ICD-10-CM | POA: Insufficient documentation

## 2016-02-08 HISTORY — DX: Cerebral infarction, unspecified: I63.9

## 2016-02-08 MED ORDER — METHOCARBAMOL 500 MG PO TABS
500.0000 mg | ORAL_TABLET | Freq: Once | ORAL | Status: AC
  Administered 2016-02-08: 500 mg via ORAL
  Filled 2016-02-08: qty 1

## 2016-02-08 MED ORDER — METHOCARBAMOL 500 MG PO TABS: 500.0000 mg | ORAL_TABLET | Freq: Two times a day (BID) | ORAL | 0 refills | Status: DC

## 2016-02-08 NOTE — ED Provider Notes (Signed)
[place] DEPT Provider Note    By signing my name below, I, Bea Graff, attest that this documentation has been prepared under the direction and in the presence of [facility], [place]. Electronically Signed: Bea Graff, ED Scribe. 02/08/16. 4:32 PM.    History   Chief Complaint Chief Complaint  Patient presents with  . Back Pain    The history is provided by the patient and medical records. No language interpreter was used.    Brittany Jimenez is a 51 y.o. female with PMHx of chronic pain, HTN who presents to the Emergency Department complaining of low back soreness that began approximately one week ago. She reports associated flu-like symptoms last week and states the pain began then with coughing. She states she lays around in bed most of the time by choice so she denies any trauma, injury or fall. She has not taken anything for pain relief. Bending and twisting increases her pain. Pt denies alleviating factors. She denies numbness, tingling or weakness of the lower extremities, bowel or bladder incontinence, fever, chills, nausea, vomiting, abdominal pain.   Past Medical History:  Diagnosis Date  . Anxiety   . Arthritis   . Asthma   . Chronic pain   . Depression   . Hypertension   . Obesity   . Stroke [facility])     Patient Active Problem List   Diagnosis Date Noted  . Abnormal screening mammogram 07/23/2015  . Pain of right great toe 06/25/2015  . Chronic ischemic multifocal multiple vascular territories stroke 03/05/2015  . Remote history of stroke 02/19/2015  . Hypertrophy of nasal turbinates 02/19/2015  . Left homonymous hemianopsia 02/08/2015  . Paresthesia of right foot 02/08/2015  . Numbness of foot 12/06/2014  . History of gout 11/01/2013  . Chronic pain of left knee 09/13/2013  . Anxiety 08/09/2013  . Arthralgia of both knees 08/09/2013  . Insomnia 08/09/2013  . MDD (major depressive disorder) ([place]) 07/22/2013  . Smoking 03/15/2013  . DUB  (dysfunctional uterine bleeding) 06/29/2012  . Pain in joint, ankle and foot 06/21/2012  . Pain in left knee 04/28/2012  . Arthritis 01/09/2012  . Asthma, mild intermittent 01/09/2012  . Hypertension, essential, benign 01/09/2012  . Chronic pain     Past Surgical History:  Procedure Laterality Date  . APPENDECTOMY    . KNEE ARTHROSCOPY WITH MEDIAL MENISECTOMY Left 01/31/2015   Procedure: LEFT KNEE ARTHROSCOPY WITH PARTIAL MEDIAL MENISCECTOMY;  Surgeon: Leandrew Koyanagi, MD;  Location: [place];  Service: Orthopedics;  Laterality: Left;  . SYNOVECTOMY Left 01/31/2015   Procedure: SYNOVECTOMY;  Surgeon: Leandrew Koyanagi, MD;  Location: [place];  Service: Orthopedics;  Laterality: Left;  . TUBAL LIGATION  1995    OB History    No data available       Home Medications    Prior to Admission medications   Medication Sig Start Date End Date Taking? Authorizing Provider  acetaminophen-codeine (TYLENOL #4) 300-60 MG tablet Take 1 tablet by mouth every 4 (four) hours as needed for moderate pain. 01/22/16   Josalyn Funches, MD  albuterol (PROVENTIL HFA;VENTOLIN HFA) 108 (90 Base) MCG/ACT inhaler Inhale 2 puffs into the lungs every 6 (six) hours as needed. For shortness of breath 11/08/15   Boykin Nearing, MD  amoxicillin-clavulanate (AUGMENTIN) 875-125 MG tablet  12/12/15   Historical Provider, MD  aspirin 81 MG tablet Take 1 tablet (81 mg total) by mouth daily. 02/19/15   Boykin Nearing, MD  busPIRone (BUSPAR) 5  MG tablet Take 1 tablet (5 mg total) by mouth 2 (two) times daily. 01/22/16   Josalyn Funches, MD  fluconazole (DIFLUCAN) 150 MG tablet Take 1 tablet (150 mg total) by mouth every 3 (three) days. 01/11/16   Josalyn Funches, MD  hydrochlorothiazide (HYDRODIURIL) 25 MG tablet Take 1 tablet (25 mg total) by mouth daily. 01/22/16   Josalyn Funches, MD  methocarbamol (ROBAXIN) 500 MG tablet Take 1 tablet (500 mg total) by mouth 2 (two) times daily. 02/08/16   Hope Bunnie Pion, NP  metroNIDAZOLE (FLAGYL) 500 MG tablet Take 1 tablet (500 mg total) by mouth 2 (two) times daily. 01/11/16   Josalyn Funches, MD  oxyCODONE-acetaminophen (PERCOCET) 10-325 MG tablet Take 1 tablet by mouth every 6 (six) hours as needed for pain. 01/15/16   Landis Martins, DPM  traZODone (DESYREL) 100 MG tablet Take 1 tablet (100 mg total) by mouth at bedtime. 11/08/15   Boykin Nearing, MD    Family History Family History  Problem Relation Age of Onset  . Heart disease Mother   . Early death Mother   . Asthma Mother   . Diabetes Mother   . Hypertension Mother   . Cancer Father   . Alcohol abuse Father   . Early death Father   . Hypertension Father   . Asthma Sister   . Cancer Sister   . Hypertension Sister   . Depression Brother   . Drug abuse Brother   . Hypertension Brother     Social History Social History  Substance Use Topics  . Smoking status: Current Every Day Smoker    Packs/day: 0.25    Years: 35.00    Types: Cigarettes  . Smokeless tobacco: Never Used  . Alcohol use No     Allergies   Patient has no known allergies.   Review of Systems Review of Systems  Constitutional: Negative for chills, diaphoresis, fatigue and fever.  HENT: Negative for congestion, dental problem, ear pain, facial swelling, sinus pressure and sore throat.   Eyes: Negative for photophobia, pain and discharge.  Respiratory: Positive for cough. Negative for chest tightness and wheezing.   Gastrointestinal: Negative for abdominal distention, abdominal pain, constipation, diarrhea, nausea and vomiting.  Genitourinary: Negative for difficulty urinating, dysuria, flank pain and frequency.  Musculoskeletal: Positive for back pain. Negative for gait problem, myalgias, neck pain and neck stiffness.  Skin: Negative for color change and rash.  Neurological: Negative for dizziness, speech difficulty, weakness, light-headedness, numbness and headaches.  Psychiatric/Behavioral: Negative for  agitation and confusion.     Physical Exam Updated Vital Signs BP 166/89 (BP Location: Right Arm)   Pulse 74   Temp 98 F (36.7 C) (Oral)   Resp 16   LMP 01/29/2013   SpO2 100%   Physical Exam  Constitutional: She is oriented to person, place, and time. She appears well-developed and well-nourished. No distress.  HENT:  Head: Normocephalic and atraumatic.  Right Ear: External ear normal.  Left Ear: External ear normal.  Nose: Nose normal.  Eyes: EOM are normal.  Neck: Normal range of motion. Neck supple.  Cardiovascular: Normal rate and regular rhythm.   Radial pulses 2+ bilaterally. DP pulses 2+ bilaterally. Adequate circulation.   Pulmonary/Chest: Effort normal. No respiratory distress. She has no wheezes. She has no rales.  Abdominal: There is no tenderness.  Musculoskeletal: Normal range of motion. She exhibits tenderness. She exhibits no edema or deformity.       Lumbar back: She exhibits tenderness and spasm. She  exhibits normal pulse.  SLR without difficulty. No tenderness over cervical or thoracic spine. Tenderness to bilateral lumbar area with palpation and with ROM.  Neurological: She is alert and oriented to person, place, and time. She has normal strength. Gait normal.  Reflex Scores:      Bicep reflexes are 2+ on the right side and 2+ on the left side.      Brachioradialis reflexes are 2+ on the right side and 2+ on the left side.      Patellar reflexes are 2+ on the right side and 2+ on the left side. Grip strength normal. Normal reflexes. Steady gait. No foot drag.  Skin: Skin is warm and dry.  Psychiatric: She has a normal mood and affect. Her behavior is normal.  Nursing note and vitals reviewed.    ED Treatments / Results  DIAGNOSTIC STUDIES: Oxygen Saturation is 100% on RA, normal by my interpretation.   COORDINATION OF CARE: 4:28 PM- Will prescribe muscle relaxer and instructed pt to take OTC Tylenol. Encouraged pt to move around and stretch her  muscles more frequently. Pt verbalizes understanding and agrees to plan.  Medications  methocarbamol (ROBAXIN) tablet 500 mg (not administered)    Labs (all labs ordered are listed, but only abnormal results are displayed) Labs Reviewed - No data to display  Radiology No results found.  Procedures Procedures (including critical care time)  Medications Ordered in ED Medications  methocarbamol (ROBAXIN) tablet 500 mg (not administered)     Initial Impression / Assessment and Plan / ED Course  I have reviewed the triage vital signs and the nursing notes.  Patient with back pain after coughing with the flu last week.  No neurological deficits and normal neuro exam.  Patient is ambulatory. No loss of bowel or bladder control. No concern for cauda equina.  No fever, night sweats, weight loss, h/o cancer, IVDA, no recent procedure to back. No urinary symptoms suggestive of UTI.  Supportive care and return precaution discussed. Appears safe for discharge at this time. Follow up as indicated in discharge paperwork.     I personally performed the services described in this documentation, which was scribed in my presence. The recorded information has been reviewed and is accurate.  Final Clinical Impressions(s) / ED Diagnoses   Final diagnoses:  Muscle spasm of back    New Prescriptions New Prescriptions   METHOCARBAMOL (ROBAXIN) 500 MG TABLET    Take 1 tablet (500 mg total) by mouth 2 (two) times daily.     [place], NP 02/08/16 [place], MD 02/08/16 [phone]

## 2016-02-08 NOTE — ED Triage Notes (Signed)
Pt c/o bil & mid lower back pain onset x1 wk, denies injury to the area, denies urinary symptoms, pt ambulatory, pt MAE, A&O x4

## 2016-02-20 NOTE — Progress Notes (Signed)
DOS 11.27.2017 Right bunionectomy with screw.

## 2016-02-27 MED FILL — traZODone HCL 100 MG TABS: 100 | 30 days supply | Qty: 30 | Fill #2

## 2016-02-28 ENCOUNTER — Telehealth: Payer: Self-pay | Admitting: Family Medicine

## 2016-02-28 DIAGNOSIS — M79674 Pain in right toe(s): Secondary | ICD-10-CM

## 2016-02-28 MED FILL — HYDROCHLOROTHIAZIDE 25 MG T: 25 | 30 days supply | Qty: 30 | Fill #0

## 2016-02-28 NOTE — Telephone Encounter (Signed)
Pt. Called requesting a refill on Tylenol # 4. Please f/u with pt.

## 2016-03-03 MED ORDER — ACETAMINOPHEN-CODEINE #4 300-60 MG PO TABS: 1.0000 | ORAL_TABLET | Freq: Four times a day (QID) | ORAL | 0 refills | Status: DC | PRN

## 2016-03-03 NOTE — Telephone Encounter (Signed)
Pt was called and informed of script being ready for pick up. Pt states that she will call and make an OV to address pain.

## 2016-03-03 NOTE — Telephone Encounter (Signed)
Please inform patient Tylenol #4 Rx ready for pick up She will need to review and sign controlled substance contract to receive regular refills She will also need a f/u visit to address pain

## 2016-03-05 MED FILL — ACETAMINOPHEN/COD #4 TABLET: 300-60 | 8 days supply | Qty: 30 | Fill #0

## 2016-03-11 ENCOUNTER — Ambulatory Visit (AMBULATORY_SURGERY_CENTER): Payer: Self-pay

## 2016-03-11 VITALS — Ht 63.0 in | Wt 175.4 lb

## 2016-03-11 DIAGNOSIS — Z1211 Encounter for screening for malignant neoplasm of colon: Secondary | ICD-10-CM

## 2016-03-11 MED ORDER — NA SULFATE-K SULFATE-MG SULF 17.5-3.13-1.6 GM/177ML PO SOLN: ORAL | 0 refills | Status: DC

## 2016-03-11 NOTE — Progress Notes (Signed)
Per pt, no allergies to soy or egg products.Pt not taking any weight loss meds or using  O2 at home. 

## 2016-03-17 MED FILL — SUPREP BOWEL PREP KIT: 17.5-3.13-1 | 1 days supply | Qty: 354 | Fill #0

## 2016-03-17 MED FILL — ?BUSPIRONE HCL 5 MG TABLET: 5 | 30 days supply | Qty: 60 | Fill #0

## 2016-03-18 ENCOUNTER — Ambulatory Visit (INDEPENDENT_AMBULATORY_CARE_PROVIDER_SITE_OTHER): Payer: Self-pay | Admitting: Sports Medicine

## 2016-03-18 DIAGNOSIS — M79671 Pain in right foot: Secondary | ICD-10-CM

## 2016-03-18 DIAGNOSIS — Z9889 Other specified postprocedural states: Secondary | ICD-10-CM

## 2016-03-18 MED ORDER — OXYCODONE-ACETAMINOPHEN 5-325 MG PO TABS: 1.0000 | ORAL_TABLET | Freq: Three times a day (TID) | ORAL | 0 refills | Status: DC | PRN

## 2016-03-18 MED FILL — OXYCODONE W/APAP 5/325 TAB: 5-325 | 7 days supply | Qty: 21 | Fill #0

## 2016-03-18 NOTE — Progress Notes (Signed)
Subjective: Brittany Jimenez is a 51 y.o. female patient seen today in office for POV #4 (DOS 12-10-15), S/P Right [place] Bunionectomy with screw fixation. Patient denies current pain at surgical site states that things are feeling good, has a little twinge of pain at end of day and a little bit of tingling, denies calf pain, denies headache, chest pain, shortness of breath, nausea, vomiting, fever, or chills. No other issues noted.   +smoker  Patient Active Problem List   Diagnosis Date Noted  . Abnormal screening mammogram 07/23/2015  . Pain of right great toe 06/25/2015  . Chronic ischemic multifocal multiple vascular territories stroke 03/05/2015  . Remote history of stroke 02/19/2015  . Hypertrophy of nasal turbinates 02/19/2015  . Left homonymous hemianopsia 02/08/2015  . Paresthesia of right foot 02/08/2015  . Numbness of foot 12/06/2014  . History of gout 11/01/2013  . Chronic pain of left knee 09/13/2013  . Anxiety 08/09/2013  . Arthralgia of both knees 08/09/2013  . Insomnia 08/09/2013  . MDD (major depressive disorder) ([place]) 07/22/2013  . Smoking 03/15/2013  . DUB (dysfunctional uterine bleeding) 06/29/2012  . Pain in joint, ankle and foot 06/21/2012  . Pain in left knee 04/28/2012  . Arthritis 01/09/2012  . Asthma, mild intermittent 01/09/2012  . Hypertension, essential, benign 01/09/2012  . Chronic pain     Current Outpatient Prescriptions on File Prior to Visit  Medication Sig Dispense Refill  . acetaminophen-codeine (TYLENOL #4) 300-60 MG tablet Take 1 tablet by mouth every 6 (six) hours as needed for moderate pain. 30 tablet 0  . albuterol (PROVENTIL HFA;VENTOLIN HFA) 108 (90 Base) MCG/ACT inhaler Inhale 2 puffs into the lungs every 6 (six) hours as needed. For shortness of breath 1 Inhaler 5  . amoxicillin-clavulanate (AUGMENTIN) 875-125 MG tablet Take 1 tablet by mouth every 12 (twelve) hours.    Marland Kitchen aspirin 81 MG tablet Take 1 tablet (81 mg total) by mouth  daily. 30 tablet 11  . busPIRone (BUSPAR) 5 MG tablet Take 1 tablet (5 mg total) by mouth 2 (two) times daily. 60 tablet 5  . docusate sodium (COLACE) 100 MG capsule Take 100 mg by mouth daily as needed for mild constipation.    . fluconazole (DIFLUCAN) 150 MG tablet Take 1 tablet (150 mg total) by mouth every 3 (three) days. (Patient not taking: Reported on 03/11/2016) 2 tablet 0  . hydrochlorothiazide (HYDRODIURIL) 25 MG tablet Take 1 tablet (25 mg total) by mouth daily. 30 tablet 5  . methocarbamol (ROBAXIN) 500 MG tablet Take 1 tablet (500 mg total) by mouth 2 (two) times daily. (Patient not taking: Reported on 03/11/2016) 20 tablet 0  . Na Sulfate-K Sulfate-Mg Sulf (SUPREP BOWEL PREP KIT) 17.5-3.13-1.6 GM/180ML SOLN Suprep as directed / no substitutions 354 mL 0  . traZODone (DESYREL) 100 MG tablet Take 1 tablet (100 mg total) by mouth at bedtime. 30 tablet 5   No current facility-administered medications on file prior to visit.     No Known Allergies  Objective: There were no vitals filed for this visit.  General: No acute distress, AAOx3  Right foot: Incision well healed with mild scar, no gapping or dehiscence at surgical site, mild swelling to right forefoot, no erythema, no warmth, no drainage, no signs of infection noted, Capillary fill time <3 seconds in all digits, gross sensation present via light touch to right foot. No pain or crepitation with range of motion right foot.  No pain with calf compression.   Assessment and Plan:  Problem List Items Addressed This Visit    None    Visit Diagnoses    S/P foot surgery, right    -  Primary   Relevant Medications   oxyCODONE-acetaminophen (ROXICET) 5-325 MG tablet   Right foot pain       Relevant Medications   oxyCODONE-acetaminophen (ROXICET) 5-325 MG tablet      -Patient seen and evaluated -Continue  to use normal shoes  -Advised patient activities as tolerated -Advised patient to ice and elevate as necessary  -Recommend  scar creams and gels -Advised smoking cessation -Continue with PRN and Pain meds as needed; Gave 1 last refill of percocet -Return in 12 weeks for final xray. In the meantime, patient to call office if any issues or problems arise.   Landis Martins, DPM

## 2016-03-25 ENCOUNTER — Encounter: Payer: Self-pay | Admitting: Gastroenterology

## 2016-03-25 ENCOUNTER — Ambulatory Visit (AMBULATORY_SURGERY_CENTER): Payer: Medicare Other | Admitting: Gastroenterology

## 2016-03-25 VITALS — BP 124/78 | HR 53 | Temp 97.8°F | Resp 10 | Ht 63.0 in | Wt 175.0 lb

## 2016-03-25 DIAGNOSIS — Z538 Procedure and treatment not carried out for other reasons: Secondary | ICD-10-CM | POA: Diagnosis not present

## 2016-03-25 DIAGNOSIS — Z1211 Encounter for screening for malignant neoplasm of colon: Secondary | ICD-10-CM

## 2016-03-25 DIAGNOSIS — Z1212 Encounter for screening for malignant neoplasm of rectum: Secondary | ICD-10-CM

## 2016-03-25 MED ORDER — SODIUM CHLORIDE 0.9 % IV SOLN: 500.0000 mL | INTRAVENOUS | Status: DC

## 2016-03-25 NOTE — Progress Notes (Signed)
Pt's states no medical or surgical changes since previsit or office visit. 

## 2016-03-25 NOTE — Progress Notes (Signed)
A and O x3. Report to RN. Tolerated MAC anesthesia well.

## 2016-03-25 NOTE — Op Note (Signed)
[place] Patient Name: Brittany Jimenez Procedure Date: 03/25/2016 9:53 AM MRN: [id number] Endoscopist: Remo Lipps P. Tria Noguera MD, MD Age: 51 Referring MD:  Date of Birth: 10-31-1965 Gender: Female Account #: [id number] Procedure:                Colonoscopy Indications:              Screening for colorectal malignant neoplasm Medicines:                Monitored Anesthesia Care Procedure:                Pre-Anesthesia Assessment:                           - Prior to the procedure, a History and Physical                            was performed, and patient medications and                            allergies were reviewed. The patient's tolerance of                            previous anesthesia was also reviewed. The risks                            and benefits of the procedure and the sedation                            options and risks were discussed with the patient.                            All questions were answered, and informed consent                            was obtained. Prior Anticoagulants: The patient has                            taken no previous anticoagulant or antiplatelet                            agents. ASA Grade Assessment: II - A patient with                            mild systemic disease. After reviewing the risks                            and benefits, the patient was deemed in                            satisfactory condition to undergo the procedure.                           After obtaining informed consent, the colonoscope  was passed under direct vision. Throughout the                            procedure, the patient's blood pressure, pulse, and                            oxygen saturations were monitored continuously. The                            Model CF-H180AL [id number]) scope was introduced                            through the anus with the intention of advancing to   the cecum. The scope was advanced to the transverse                            colon before the procedure was aborted. Medications                            were given. The colonoscopy was technically                            difficult and complex due to inadequate bowel prep.                            The patient tolerated the procedure well. The                            quality of the bowel preparation was inadequate.                            The rectum was photographed. Scope In: 10:03:31 AM Scope Out: 10:06:40 AM Total Procedure Duration: 0 hours 3 minutes 9 seconds  Findings:                 The perianal and digital rectal examinations were                            normal.                           A large amount of semi-liquid stool was found in                            the entire colon, interfering with visualization.                            Lavage was attempted was not possible to achieve                            adequate views. The prep was inadequate for                            screening purposes and the procedure was aborted in  the transverse colon.                           A few medium-mouthed diverticula were found in the                            left colon. Complications:            No immediate complications. Estimated blood loss:                            None. Estimated Blood Loss:     Estimated blood loss: none. Impression:               - Preparation of the colon was inadequate.                           - Stool in the entire examined colon.                           - Diverticulosis in the left colon. Recommendation:           - Patient has a contact number available for                            emergencies. The signs and symptoms of potential                            delayed complications were discussed with the                            patient. Return to normal activities tomorrow.                             Written discharge instructions were provided to the                            patient.                           - Resume previous diet.                           - Continue present medications.                           - Repeat colonoscopy with double prep because the                            bowel preparation was suboptimal                           - Reschedule colonoscopy at the patient's                            convenience in the upcoming months Remo Lipps P. Gurbani Figge MD, MD 03/25/2016 10:11:28 AM This report has been signed electronically.

## 2016-03-25 NOTE — Patient Instructions (Signed)

## 2016-03-26 ENCOUNTER — Telehealth: Payer: Self-pay

## 2016-03-26 ENCOUNTER — Telehealth: Payer: Self-pay | Admitting: *Deleted

## 2016-03-26 NOTE — Telephone Encounter (Signed)
  Follow up Call-  Call back number 03/25/2016  Post procedure Call Back phone  # [phone]  Permission to leave phone message Yes  Some recent data might be hidden    [facility]

## 2016-03-26 NOTE — Telephone Encounter (Signed)
  Follow up Call-  Call back number 03/25/2016  Post procedure Call Back phone  # [phone]  Permission to leave phone message Yes  Some recent data might be hidden   Left message

## 2016-04-17 MED FILL — traZODone HCL 100 MG TABS: 100 | 30 days supply | Qty: 30 | Fill #3

## 2016-04-25 ENCOUNTER — Ambulatory Visit: Payer: Medicare Other | Attending: Family Medicine | Admitting: Family Medicine

## 2016-04-25 ENCOUNTER — Encounter: Payer: Self-pay | Admitting: Family Medicine

## 2016-04-25 VITALS — BP 158/80 | HR 76 | Temp 98.1°F | Ht 63.0 in | Wt 178.0 lb

## 2016-04-25 DIAGNOSIS — F17210 Nicotine dependence, cigarettes, uncomplicated: Secondary | ICD-10-CM | POA: Diagnosis not present

## 2016-04-25 DIAGNOSIS — Z79899 Other long term (current) drug therapy: Secondary | ICD-10-CM | POA: Diagnosis not present

## 2016-04-25 DIAGNOSIS — Z7982 Long term (current) use of aspirin: Secondary | ICD-10-CM | POA: Diagnosis not present

## 2016-04-25 DIAGNOSIS — G4700 Insomnia, unspecified: Secondary | ICD-10-CM

## 2016-04-25 DIAGNOSIS — Z8673 Personal history of transient ischemic attack (TIA), and cerebral infarction without residual deficits: Secondary | ICD-10-CM

## 2016-04-25 DIAGNOSIS — M545 Low back pain: Secondary | ICD-10-CM | POA: Insufficient documentation

## 2016-04-25 DIAGNOSIS — M79674 Pain in right toe(s): Secondary | ICD-10-CM | POA: Diagnosis not present

## 2016-04-25 DIAGNOSIS — G8929 Other chronic pain: Secondary | ICD-10-CM | POA: Insufficient documentation

## 2016-04-25 DIAGNOSIS — I10 Essential (primary) hypertension: Secondary | ICD-10-CM

## 2016-04-25 MED ORDER — ASPIRIN 81 MG PO TABS: 81.0000 mg | ORAL_TABLET | Freq: Every day | ORAL | 11 refills | Status: DC

## 2016-04-25 MED ORDER — TRAZODONE HCL 100 MG PO TABS: 100.0000 mg | ORAL_TABLET | Freq: Every day | ORAL | 2 refills | Status: DC

## 2016-04-25 MED ORDER — ACETAMINOPHEN-CODEINE #4 300-60 MG PO TABS: 1.0000 | ORAL_TABLET | Freq: Four times a day (QID) | ORAL | 0 refills | Status: DC | PRN

## 2016-04-25 MED ORDER — ZOLPIDEM TARTRATE 10 MG PO TABS: 10.0000 mg | ORAL_TABLET | Freq: Every evening | ORAL | 1 refills | Status: DC | PRN

## 2016-04-25 MED ORDER — HYDROCHLOROTHIAZIDE 25 MG PO TABS: 25.0000 mg | ORAL_TABLET | Freq: Every day | ORAL | 5 refills | Status: DC

## 2016-04-25 MED FILL — HYDROCHLOROTHIAZIDE 25 MG T: 25 | 30 days supply | Qty: 30 | Fill #0

## 2016-04-25 MED FILL — ZOLPIDEM TARTRATE 10 MG TAB: 10 | 30 days supply | Qty: 30 | Fill #0

## 2016-04-25 MED FILL — ACETAMINOPHEN/COD #4 TABLET: 300-60 | 7 days supply | Qty: 30 | Fill #0

## 2016-04-25 NOTE — Patient Instructions (Addendum)
Brittany Jimenez was seen today for hypertension.  Diagnoses and all orders for this visit:  Hypertension, essential, benign -     hydrochlorothiazide (HYDRODIURIL) 25 MG tablet; Take 1 tablet (25 mg total) by mouth daily.  Pain of right great toe -     acetaminophen-codeine (TYLENOL #4) 300-60 MG tablet; Take 1 tablet by mouth every 6 (six) hours as needed for moderate pain.  Remote history of stroke -     aspirin 81 MG tablet; Take 1 tablet (81 mg total) by mouth daily.  Insomnia, unspecified type -     zolpidem (AMBIEN) 10 MG tablet; Take 1 tablet (10 mg total) by mouth at bedtime as needed for sleep. -     traZODone (DESYREL) 100 MG tablet; Take 1 tablet (100 mg total) by mouth at bedtime.  exercise for 30 minutes most days of the week [place] job with cutting down the cigarettes, goal is 0, you are very close  Keep salt intake low to reduce BP  f/u in 6 weeks for insomnia and HTN  Dr. Adrian Blackwater

## 2016-04-25 NOTE — Assessment & Plan Note (Signed)
A; insomnia, declined  P: Continue trazodone 100 mg nightly Start ambien 10 mg nightly Close f/u to evaluate response to treatment

## 2016-04-25 NOTE — Progress Notes (Signed)
Pt states trazodone is not working pt would like to try Ambien.

## 2016-04-25 NOTE — Progress Notes (Signed)
Subjective:  Patient ID: Brittany Jimenez, female    DOB: 09/22/1965  Age: 51 y.o. MRN: [id number]  CC: Hypertension   HPI Brittany Jimenez presents for    1. CHRONIC HYPERTENSION  Disease Monitoring  Blood pressure range: not checking   Chest pain: no   Dyspnea: no   Claudication: no   Medication compliance: yes  Medication Side Effects  Lightheadedness: no   Urinary frequency: no   Edema: no   Preventitive Healthcare:  Exercise: 2-3 times per week. Doing rehab for toes, jumping jacks, doing some cardio at home.   Diet Pattern: eating 3 meals per day   Salt Restriction: no  2. Insomnia:  she is taking 200 mg of trazodone. Over the last 3 weeks she has been sleeping 4 hr per night. She is currently not working. Denies dizziness, lightheadedness, chest pain or shortness of breath.   3. Chronic pain: knee, R foot forefoot medial and low back pain. Well controlled with tylenol #4. Major pain is R forefoot following bunion surgery.   4. Current smoker: smoking 3 cigarettes per day.   Social History  Substance Use Topics  . Smoking status: Current Every Day Smoker    Packs/day: 0.25    Years: 35.00    Types: Cigarettes  . Smokeless tobacco: Never Used  . Alcohol use No    Outpatient Medications Prior to Visit  Medication Sig Dispense Refill  . acetaminophen-codeine (TYLENOL #4) 300-60 MG tablet Take 1 tablet by mouth every 6 (six) hours as needed for moderate pain. 30 tablet 0  . albuterol (PROVENTIL HFA;VENTOLIN HFA) 108 (90 Base) MCG/ACT inhaler Inhale 2 puffs into the lungs every 6 (six) hours as needed. For shortness of breath 1 Inhaler 5  . aspirin 81 MG tablet Take 1 tablet (81 mg total) by mouth daily. 30 tablet 11  . busPIRone (BUSPAR) 5 MG tablet Take 1 tablet (5 mg total) by mouth 2 (two) times daily. 60 tablet 5  . hydrochlorothiazide (HYDRODIURIL) 25 MG tablet Take 1 tablet (25 mg total) by mouth daily. 30 tablet 5  . docusate sodium (COLACE) 100  MG capsule Take 100 mg by mouth daily as needed for mild constipation.    . fluconazole (DIFLUCAN) 150 MG tablet Take 1 tablet (150 mg total) by mouth every 3 (three) days. (Patient not taking: Reported on 03/11/2016) 2 tablet 0  . methocarbamol (ROBAXIN) 500 MG tablet Take 1 tablet (500 mg total) by mouth 2 (two) times daily. (Patient not taking: Reported on 03/11/2016) 20 tablet 0  . oxyCODONE-acetaminophen (ROXICET) 5-325 MG tablet Take 1 tablet by mouth every 8 (eight) hours as needed for severe pain. (Patient not taking: Reported on 03/25/2016) 21 tablet 0  . traZODone (DESYREL) 100 MG tablet Take 1 tablet (100 mg total) by mouth at bedtime. (Patient not taking: Reported on 04/25/2016) 30 tablet 5   Facility-Administered Medications Prior to Visit  Medication Dose Route Frequency Provider Last Rate Last Dose  . 0.9 %  sodium chloride infusion  500 mL Intravenous Continuous Manus Gunning, MD        ROS Review of Systems  Constitutional: Negative for chills and fever.  Eyes: Negative for visual disturbance.  Respiratory: Negative for shortness of breath.   Cardiovascular: Negative for chest pain.  Gastrointestinal: Negative for abdominal pain and blood in stool.  Musculoskeletal: Negative for arthralgias and back pain.  Skin: Negative for rash.  Allergic/Immunologic: Negative for immunocompromised state.  Hematological: Negative for adenopathy. Does not bruise/bleed  easily.  Psychiatric/Behavioral: Negative for dysphoric mood and suicidal ideas.    Objective:  BP (!) 158/80   Pulse 76   Temp 98.1 F (36.7 C) (Oral)   Ht 5\' 3"  (1.6 m)   Wt 178 lb (80.7 kg)   LMP 01/29/2013   SpO2 99%   BMI 31.53 kg/m   BP/Weight 04/25/2016 03/25/2016 7/86/7544  Systolic BP 920 100 -  Diastolic BP 80 78 -  Wt. (Lbs) 178 175 175.4  BMI 31.53 31 31.07  Some encounter information is confidential and restricted. Go to Review Flowsheets activity to see all data.   BP Readings from Last 3  Encounters:  04/25/16 (!) 158/80  03/25/16 124/78  02/08/16 166/89    Physical Exam  Constitutional: She is oriented to person, place, and time. She appears well-developed and well-nourished. No distress.  HENT:  Head: Normocephalic and atraumatic.  Cardiovascular: Normal rate, regular rhythm, normal heart sounds and intact distal pulses.   Pulmonary/Chest: Effort normal and breath sounds normal.  Musculoskeletal: She exhibits no edema.  Neurological: She is alert and oriented to person, place, and time.  Skin: Skin is warm and dry. No rash noted.  Psychiatric: She has a normal mood and affect.    Assessment & Plan:  [name] was seen today for hypertension.  Diagnoses and all orders for this visit:  Hypertension, essential, benign -     hydrochlorothiazide (HYDRODIURIL) 25 MG tablet; Take 1 tablet (25 mg total) by mouth daily.  Pain of right great toe -     acetaminophen-codeine (TYLENOL #4) 300-60 MG tablet; Take 1 tablet by mouth every 6 (six) hours as needed for moderate pain.  Remote history of stroke -     aspirin 81 MG tablet; Take 1 tablet (81 mg total) by mouth daily.  Insomnia, unspecified type -     zolpidem (AMBIEN) 10 MG tablet; Take 1 tablet (10 mg total) by mouth at bedtime as needed for sleep. -     traZODone (DESYREL) 100 MG tablet; Take 1 tablet (100 mg total) by mouth at bedtime.   There are no diagnoses linked to this encounter.  No orders of the defined types were placed in this encounter.   Follow-up: Return in about 6 weeks (around 06/06/2016) for HTN and insomnia .   Boykin Nearing MD

## 2016-04-25 NOTE — Assessment & Plan Note (Signed)
A: HTN Med: compliant P: Continue HCTZ 25 mg daily Consider changing to prinzide 40-25 if BP elevated at f/u

## 2016-05-12 MED FILL — ?TRAZODONE 100 MG TABLET: 100 MG | 30 days supply | Qty: 30 | Fill #4

## 2016-05-13 ENCOUNTER — Ambulatory Visit: Payer: Medicare Other | Attending: Family Medicine | Admitting: Family Medicine

## 2016-05-13 ENCOUNTER — Encounter: Payer: Self-pay | Admitting: Family Medicine

## 2016-05-13 VITALS — BP 126/80 | HR 67 | Temp 97.6°F | Ht 63.0 in | Wt 172.6 lb

## 2016-05-13 DIAGNOSIS — F17210 Nicotine dependence, cigarettes, uncomplicated: Secondary | ICD-10-CM | POA: Diagnosis not present

## 2016-05-13 DIAGNOSIS — Z7982 Long term (current) use of aspirin: Secondary | ICD-10-CM | POA: Diagnosis not present

## 2016-05-13 DIAGNOSIS — R42 Dizziness and giddiness: Secondary | ICD-10-CM | POA: Diagnosis not present

## 2016-05-13 DIAGNOSIS — R928 Other abnormal and inconclusive findings on diagnostic imaging of breast: Secondary | ICD-10-CM

## 2016-05-13 DIAGNOSIS — N951 Menopausal and female climacteric states: Secondary | ICD-10-CM | POA: Insufficient documentation

## 2016-05-13 DIAGNOSIS — Z79899 Other long term (current) drug therapy: Secondary | ICD-10-CM | POA: Insufficient documentation

## 2016-05-13 MED ORDER — CLONAZEPAM 0.5 MG PO TABS: 1.0000 mg | ORAL_TABLET | Freq: Every day | ORAL | 0 refills | Status: DC

## 2016-05-13 MED ORDER — CLONAZEPAM 1 MG PO TABS
1.0000 mg | ORAL_TABLET | Freq: Every day | ORAL | 0 refills | Status: DC
Start: 2016-05-13 — End: 2016-06-12

## 2016-05-13 MED FILL — clonazePAM 1 MG TABS: 1 | 21 days supply | Qty: 21 | Fill #0

## 2016-05-13 NOTE — Assessment & Plan Note (Signed)
A: new onset hot flashes, patient is post menopausal, light smoker, recent change in diet with increased in salt and spicy foods P: Stop cheetos hot fries Short course of PM klonopin to reduce vasomotor symptoms Patient to log symptoms Cautioned patient not to mix klonopin with codeine

## 2016-05-13 NOTE — Progress Notes (Signed)
Subjective:  Patient ID: Brittany Jimenez, female    DOB: 09-16-1965  Age: 51 y.o. MRN: [id number]  CC: Menopause   HPI Brittany Jimenez presents for   1. Hot flashes: She is taking 100 mg of trazodone and Ambien 10 mg nightly. They were working well for her until last week she is waking up at 3 AM with a hot flashes. She is not able to go back to sleep. She reports she was doing just fine prior to this. She reports her LMP was in 2015 or 2016. She I still only smoking 3 cigarettes per day. No change in diet other than eating Cheetos hot fries.  No increased stress. She does have daytime hotflashes but not as bad at night. She reports two episodes of dizziness. Denies depression.     Social History  Substance Use Topics  . Smoking status: Current Every Day Smoker    Packs/day: 0.25    Years: 35.00    Types: Cigarettes  . Smokeless tobacco: Never Used  . Alcohol use No   Outpatient Medications Prior to Visit  Medication Sig Dispense Refill  . acetaminophen-codeine (TYLENOL #4) 300-60 MG tablet Take 1 tablet by mouth every 6 (six) hours as needed for moderate pain. 30 tablet 0  . albuterol (PROVENTIL HFA;VENTOLIN HFA) 108 (90 Base) MCG/ACT inhaler Inhale 2 puffs into the lungs every 6 (six) hours as needed. For shortness of breath 1 Inhaler 5  . aspirin 81 MG tablet Take 1 tablet (81 mg total) by mouth daily. 30 tablet 11  . busPIRone (BUSPAR) 5 MG tablet Take 1 tablet (5 mg total) by mouth 2 (two) times daily. 60 tablet 5  . hydrochlorothiazide (HYDRODIURIL) 25 MG tablet Take 1 tablet (25 mg total) by mouth daily. 30 tablet 5  . traZODone (DESYREL) 100 MG tablet Take 1 tablet (100 mg total) by mouth at bedtime. 30 tablet 2  . zolpidem (AMBIEN) 10 MG tablet Take 1 tablet (10 mg total) by mouth at bedtime as needed for sleep. 30 tablet 1  . docusate sodium (COLACE) 100 MG capsule Take 100 mg by mouth daily as needed for mild constipation.     Facility-Administered  Medications Prior to Visit  Medication Dose Route Frequency Provider Last Rate Last Dose  . 0.9 %  sodium chloride infusion  500 mL Intravenous Continuous Manus Gunning, MD        ROS Review of Systems  Constitutional: Negative for chills and fever.  Eyes: Negative for visual disturbance.  Respiratory: Negative for shortness of breath.   Cardiovascular: Negative for chest pain.  Gastrointestinal: Negative for abdominal pain and blood in stool.  Endocrine: Positive for heat intolerance.  Musculoskeletal: Negative for arthralgias and back pain.  Skin: Negative for rash.  Allergic/Immunologic: Negative for immunocompromised state.  Hematological: Negative for adenopathy. Does not bruise/bleed easily.  Psychiatric/Behavioral: Positive for sleep disturbance. Negative for dysphoric mood and suicidal ideas.    Objective:  BP 126/80   Pulse 67   Temp 97.6 F (36.4 C) (Oral)   Ht 5\' 3"  (1.6 m)   Wt 172 lb 9.6 oz (78.3 kg)   LMP 01/29/2013   SpO2 98%   BMI 30.57 kg/m   BP/Weight 05/13/2016 04/25/2016 05/31/8414  Systolic BP 606 301 601  Diastolic BP 80 80 78  Wt. (Lbs) 172.6 178 175  BMI 30.57 31.53 31  Some encounter information is confidential and restricted. Go to Review Flowsheets activity to see all data.    Physical Exam  Constitutional: She is oriented to person, place, and time. She appears well-developed and well-nourished. No distress.  HENT:  Head: Normocephalic and atraumatic.  Cardiovascular: Normal rate, regular rhythm, normal heart sounds and intact distal pulses.   Pulmonary/Chest: Effort normal and breath sounds normal.  Musculoskeletal: She exhibits no edema.  Neurological: She is alert and oriented to person, place, and time.  Skin: Skin is warm and dry. No rash noted.  Psychiatric: She has a normal mood and affect.     Assessment & Plan:   Brittany Jimenez was seen today for menopause.  Diagnoses and all orders for this visit:  Hot flashes due to  menopause -     Discontinue: clonazePAM (KLONOPIN) 0.5 MG tablet; Take 2 tablets (1 mg total) by mouth at bedtime. -     clonazePAM (KLONOPIN) 1 MG tablet; Take 1 tablet (1 mg total) by mouth at bedtime.    Meds ordered this encounter  Medications  . DISCONTD: clonazePAM (KLONOPIN) 0.5 MG tablet    Sig: Take 2 tablets (1 mg total) by mouth at bedtime.    Dispense:  21 tablet    Refill:  0  . clonazePAM (KLONOPIN) 1 MG tablet    Sig: Take 1 tablet (1 mg total) by mouth at bedtime.    Dispense:  21 tablet    Refill:  0    Follow-up: Return in about 2 weeks (around 05/27/2016) for hot flashes.   Boykin Nearing MD

## 2016-05-13 NOTE — Patient Instructions (Addendum)
Brittany Jimenez was seen today for menopause.  Diagnoses and all orders for this visit:  Hot flashes due to menopause -     Discontinue: clonazePAM (KLONOPIN) 0.5 MG tablet; Take 2 tablets (1 mg total) by mouth at bedtime. -     clonazePAM (KLONOPIN) 1 MG tablet; Take 1 tablet (1 mg total) by mouth at bedtime.   Try not to take tylenol #4 in the evening while taking klonopin No Cheetos hot fries for now  Evening primrose, black khohas and estroven are over the counter herbal treatments for   f/u in 2 weeks for hot flashes  Dr. Adrian Blackwater   Menopause Menopause is the normal time of life when menstrual periods stop completely. Menopause is complete when you have missed 12 consecutive menstrual periods. It usually occurs between the ages of 29 years and 71 years. Very rarely does a woman develop menopause before the age of 33 years. At menopause, your ovaries stop producing the female hormones estrogen and progesterone. This can cause undesirable symptoms and also affect your health. Sometimes the symptoms may occur 4-5 years before the menopause begins. There is no relationship between menopause and:  Oral contraceptives.  Number of children you had.  Race.  The age your menstrual periods started (menarche). Heavy smokers and very thin women may develop menopause earlier in life. What are the causes?  The ovaries stop producing the female hormones estrogen and progesterone. Other causes include:  Surgery to remove both ovaries.  The ovaries stop functioning for no known reason.  Tumors of the pituitary gland in the brain.  Medical disease that affects the ovaries and hormone production.  Radiation treatment to the abdomen or pelvis.  Chemotherapy that affects the ovaries. What are the signs or symptoms?  Hot flashes.  Night sweats.  Decrease in sex drive.  Vaginal dryness and thinning of the vagina causing painful intercourse.  Dryness of the skin and developing  wrinkles.  Headaches.  Tiredness.  Irritability.  Memory problems.  Weight gain.  Bladder infections.  Hair growth of the face and chest.  Infertility. More serious symptoms include:  Loss of bone (osteoporosis) causing breaks (fractures).  Depression.  Hardening and narrowing of the arteries (atherosclerosis) causing heart attacks and strokes. How is this diagnosed?  When the menstrual periods have stopped for 12 straight months.  Physical exam.  Hormone studies of the blood. How is this treated? There are many treatment choices and nearly as many questions about them. The decisions to treat or not to treat menopausal changes is an individual choice made with your health care provider. Your health care provider can discuss the treatments with you. Together, you can decide which treatment will work best for you. Your treatment choices may include:  Hormone therapy (estrogen and progesterone).  Non-hormonal medicines.  Treating the individual symptoms with medicine (for example antidepressants for depression).  Herbal medicines that may help specific symptoms.  Counseling by a psychiatrist or psychologist.  Group therapy.  Lifestyle changes including:  Eating healthy.  Regular exercise.  Limiting caffeine and alcohol.  Stress management and meditation.  No treatment. Follow these instructions at home:  Take the medicine your health care provider gives you as directed.  Get plenty of sleep and rest.  Exercise regularly.  Eat a diet that contains calcium (good for the bones) and soy products (acts like estrogen hormone).  Avoid alcoholic beverages.  Do not smoke.  If you have hot flashes, dress in layers.  Take supplements, calcium, and vitamin  D to strengthen bones.  You can use over-the-counter lubricants or moisturizers for vaginal dryness.  Group therapy is sometimes very helpful.  Acupuncture may be helpful in some cases. Contact a  health care provider if:  You are not sure you are in menopause.  You are having menopausal symptoms and need advice and treatment.  You are still having menstrual periods after age 78 years.  You have pain with intercourse.  Menopause is complete (no menstrual period for 12 months) and you develop vaginal bleeding.  You need a referral to a specialist (gynecologist, psychiatrist, or psychologist) for treatment. Get help right away if:  You have severe depression.  You have excessive vaginal bleeding.  You fell and think you have a broken bone.  You have pain when you urinate.  You develop leg or chest pain.  You have a fast pounding heart beat (palpitations).  You have severe headaches.  You develop vision problems.  You feel a lump in your breast.  You have abdominal pain or severe indigestion. This information is not intended to replace advice given to you by your health care provider. Make sure you discuss any questions you have with your health care provider. Document Released: 03/22/2003 Document Revised: 06/07/2015 Document Reviewed: 07/29/2012 Elsevier Interactive Patient Education  2017 [employer].

## 2016-05-20 MED FILL — ZOLPIDEM TARTRATE 10 MG TAB: 10 | 30 days supply | Qty: 30 | Fill #1

## 2016-05-28 ENCOUNTER — Encounter: Payer: Self-pay | Admitting: Family Medicine

## 2016-05-28 ENCOUNTER — Encounter: Payer: Medicare Other | Admitting: Gastroenterology

## 2016-05-28 NOTE — Progress Notes (Signed)
Subjective:  Patient ID: Brittany Jimenez, female    DOB: Aug 30, 1965  Age: 51 y.o. MRN: [id number]  CC: Follow-up   HPI Brittany Jimenez presents for   1. Hot flashes: She is taking 100 mg of trazodone and Ambien 10 mg nightly. They were working well for her until 4 weeks ago she started  waking up at 3 AM with a hot flashes. She was noto go back to sleep. She reports she was doing just fine prior to this. She reports her LMP was in 2015 or 2016. She was still only smoking 3 cigarettes per day. No change in diet other than eating Cheetos hot fries.  No increased stress. She does have daytime hotflashes but not as bad at night. She reports two episodes of dizziness. Denies depression.   After the last visit she started klonopin 1 mg nightly. She stopped hot cheetos fries. She reports hot flashes has completely resolved. She is sleeping perfectly fine with trazodone 100 mg, Ambien 10 mg,  She has started  She has not smoked a cigarette in two days. No craving.   [facility] [address]    Social History  Substance Use Topics  . Smoking status: Current Every Day Smoker    Packs/day: 0.25    Years: 35.00    Types: Cigarettes  . Smokeless tobacco: Never Used  . Alcohol use No   Outpatient Medications Prior to Visit  Medication Sig Dispense Refill  . acetaminophen-codeine (TYLENOL #4) 300-60 MG tablet Take 1 tablet by mouth every 6 (six) hours as needed for moderate pain. 30 tablet 0  . albuterol (PROVENTIL HFA;VENTOLIN HFA) 108 (90 Base) MCG/ACT inhaler Inhale 2 puffs into the lungs every 6 (six) hours as needed. For shortness of breath 1 Inhaler 5  . aspirin 81 MG tablet Take 1 tablet (81 mg total) by mouth daily. 30 tablet 11  . busPIRone (BUSPAR) 5 MG tablet Take 1 tablet (5 mg total) by mouth 2 (two) times daily. 60 tablet 5  . clonazePAM (KLONOPIN) 1 MG tablet Take 1 tablet (1 mg total) by mouth at bedtime. 21 tablet 0  . docusate  sodium (COLACE) 100 MG capsule Take 100 mg by mouth daily as needed for mild constipation.    . hydrochlorothiazide (HYDRODIURIL) 25 MG tablet Take 1 tablet (25 mg total) by mouth daily. 30 tablet 5  . traZODone (DESYREL) 100 MG tablet Take 1 tablet (100 mg total) by mouth at bedtime. 30 tablet 2  . zolpidem (AMBIEN) 10 MG tablet Take 1 tablet (10 mg total) by mouth at bedtime as needed for sleep. 30 tablet 1   Facility-Administered Medications Prior to Visit  Medication Dose Route Frequency Provider Last Rate Last Dose  . 0.9 %  sodium chloride infusion  500 mL Intravenous Continuous Armbruster, Renelda Loma, MD        ROS Review of Systems  Constitutional: Negative for chills and fever.  Eyes: Negative for visual disturbance.  Respiratory: Negative for shortness of breath.   Cardiovascular: Negative for chest pain.  Gastrointestinal: Negative for abdominal pain and blood in stool.  Endocrine: Negative for heat intolerance.  Musculoskeletal: Negative for arthralgias and back pain.  Skin: Negative for rash.  Allergic/Immunologic: Negative for immunocompromised state.  Hematological: Negative for adenopathy. Does not bruise/bleed easily.  Psychiatric/Behavioral: Negative for dysphoric mood, sleep disturbance and suicidal ideas.    Objective:  BP 134/82   Pulse 77   Temp 98.1 F (36.7 C) (Oral)  Wt 171 lb 9.6 oz (77.8 kg)   LMP 01/29/2013   SpO2 100%   BMI 30.40 kg/m   BP/Weight 05/29/2016 05/13/2016 8/83/2549  Systolic BP 826 415 830  Diastolic BP 82 80 80  Wt. (Lbs) 171.6 172.6 178  BMI 30.4 30.57 31.53  Some encounter information is confidential and restricted. Go to Review Flowsheets activity to see all data.    Physical Exam  Constitutional: She is oriented to person, place, and time. She appears well-developed and well-nourished. No distress.  HENT:  Head: Normocephalic and atraumatic.  Cardiovascular: Normal rate, regular rhythm, normal heart sounds and intact distal  pulses.   Pulmonary/Chest: Effort normal and breath sounds normal.  Musculoskeletal: She exhibits no edema.  Neurological: She is alert and oriented to person, place, and time.  Skin: Skin is warm and dry. No rash noted.  Psychiatric: She has a normal mood and affect.     Assessment & Plan:   Dorothyann was seen today for follow-up.  Diagnoses and all orders for this visit:  Insomnia, unspecified type -     zolpidem (AMBIEN) 10 MG tablet; Take 1 tablet (10 mg total) by mouth at bedtime as needed for sleep. -     traZODone (DESYREL) 100 MG tablet; Take 1 tablet (100 mg total) by mouth at bedtime.  Pain of right great toe -     acetaminophen-codeine (TYLENOL #4) 300-60 MG tablet; Take 1 tablet by mouth daily as needed for moderate pain.    No orders of the defined types were placed in this encounter.   Follow-up: Return in about 2 months (around 07/29/2016) for HTN, hot flashes, diagnostic mammogram.   Boykin Nearing MD

## 2016-05-29 ENCOUNTER — Encounter: Payer: Self-pay | Admitting: Family Medicine

## 2016-05-29 ENCOUNTER — Ambulatory Visit: Payer: Medicare Other | Attending: Family Medicine | Admitting: Family Medicine

## 2016-05-29 DIAGNOSIS — Z7982 Long term (current) use of aspirin: Secondary | ICD-10-CM | POA: Insufficient documentation

## 2016-05-29 DIAGNOSIS — M79674 Pain in right toe(s): Secondary | ICD-10-CM | POA: Diagnosis not present

## 2016-05-29 DIAGNOSIS — G4700 Insomnia, unspecified: Secondary | ICD-10-CM | POA: Diagnosis not present

## 2016-05-29 DIAGNOSIS — N951 Menopausal and female climacteric states: Secondary | ICD-10-CM | POA: Insufficient documentation

## 2016-05-29 MED ORDER — ZOLPIDEM TARTRATE 10 MG PO TABS: 10.0000 mg | ORAL_TABLET | Freq: Every evening | ORAL | 5 refills | Status: DC | PRN

## 2016-05-29 MED ORDER — ACETAMINOPHEN-CODEINE #4 300-60 MG PO TABS: 1.0000 | ORAL_TABLET | Freq: Every day | ORAL | 2 refills | Status: DC | PRN

## 2016-05-29 MED ORDER — TRAZODONE HCL 100 MG PO TABS: 100.0000 mg | ORAL_TABLET | Freq: Every day | ORAL | 5 refills | Status: DC

## 2016-05-29 NOTE — Assessment & Plan Note (Signed)
Insomnia related to hot flashes has resolved with klonopin and trigger avoidance Plan: Complete klonopin she is advised to take 1/2-1/4 pill  Once done wait for at least 2 days if symptoms return we will restart at reduced dose if symptoms do no will not refill She agrees with plan and voices understanding

## 2016-05-29 NOTE — Patient Instructions (Addendum)
Brittany Jimenez was seen today for follow-up.  Diagnoses and all orders for this visit:  Insomnia, unspecified type -     zolpidem (AMBIEN) 10 MG tablet; Take 1 tablet (10 mg total) by mouth at bedtime as needed for sleep. -     traZODone (DESYREL) 100 MG tablet; Take 1 tablet (100 mg total) by mouth at bedtime.  Pain of right great toe -     acetaminophen-codeine (TYLENOL #4) 300-60 MG tablet; Take 1 tablet by mouth daily as needed for moderate pain.  finish klonopin you can even break the dose in half  Or quarter and take 0.25-0.5 mg  If you find that hot flashes return please call and I will refill   F/u 2 months sooner if needed for insomnia and HTN   Dr. Adrian Blackwater

## 2016-06-10 ENCOUNTER — Telehealth: Payer: Self-pay | Admitting: Family Medicine

## 2016-06-10 DIAGNOSIS — N951 Menopausal and female climacteric states: Secondary | ICD-10-CM

## 2016-06-10 NOTE — Telephone Encounter (Signed)
Can't sleep she is not sure if is about the med or what, she like to speak with the nurse or the pcp, please follow up

## 2016-06-11 NOTE — Telephone Encounter (Signed)
Will route to PCP 

## 2016-06-12 MED ORDER — VENLAFAXINE HCL ER 37.5 MG PO CP24: ORAL_CAPSULE | ORAL | 0 refills | Status: DC

## 2016-06-12 MED ORDER — CLONAZEPAM 1 MG PO TABS: 1.0000 mg | ORAL_TABLET | Freq: Every day | ORAL | 0 refills | Status: DC

## 2016-06-12 NOTE — Telephone Encounter (Signed)
Called back to patient verified name  She is reports hot flashes followed by chills and trouble falling asleep returned once she stopped klonopin  Plan: Restart klonopin for 3 more weeks Add effexor as long term option She has been informed that combo of tylenol #4 and klonopin is high risk and not recommended longterm  She agrees with plan and voices understanding  Phone in [facility]opin [facility] Sent effexor to onsite pharmcay

## 2016-06-17 ENCOUNTER — Ambulatory Visit: Payer: Medicare Other | Admitting: Sports Medicine

## 2016-06-25 MED FILL — traZODone HCL 100 MG TABS: 100 | 30 days supply | Qty: 30 | Fill #0

## 2016-06-27 MED FILL — ZOLPIDEM TARTRATE 10 MG TAB: 10 | 30 days supply | Qty: 30 | Fill #0

## 2016-07-02 ENCOUNTER — Encounter: Payer: Self-pay | Admitting: Podiatry

## 2016-07-02 ENCOUNTER — Ambulatory Visit (INDEPENDENT_AMBULATORY_CARE_PROVIDER_SITE_OTHER): Payer: Medicare Other

## 2016-07-02 ENCOUNTER — Ambulatory Visit (INDEPENDENT_AMBULATORY_CARE_PROVIDER_SITE_OTHER): Payer: Medicare Other | Admitting: Podiatry

## 2016-07-02 VITALS — BP 144/90 | HR 69 | Temp 96.6°F | Resp 18

## 2016-07-02 DIAGNOSIS — Z9889 Other specified postprocedural states: Secondary | ICD-10-CM

## 2016-07-02 DIAGNOSIS — L02611 Cutaneous abscess of right foot: Secondary | ICD-10-CM | POA: Diagnosis not present

## 2016-07-02 DIAGNOSIS — L03031 Cellulitis of right toe: Secondary | ICD-10-CM | POA: Diagnosis not present

## 2016-07-02 DIAGNOSIS — M2011 Hallux valgus (acquired), right foot: Secondary | ICD-10-CM | POA: Diagnosis not present

## 2016-07-02 MED ORDER — AMOXICILLIN-POT CLAVULANATE 875-125 MG PO TABS: 1.0000 | ORAL_TABLET | Freq: Two times a day (BID) | ORAL | 1 refills | Status: DC

## 2016-07-02 MED FILL — AMOX-CLAV 875-125 MG TABLET: 875-125 | 7 days supply | Qty: 14 | Fill #0

## 2016-07-02 NOTE — Progress Notes (Signed)
   Subjective:    Patient ID: Brittany Jimenez, female    DOB: [birth year], 51 y.o.   MRN: [id number]  HPI This patient presents today with 5 day history of pain and color changes localized in or around a right hallux area. The symptoms are present on and off weightbearing. Patient has reduce standing walking to accommodate to the symptoms. She denies any direct injury to the foot and does not know why the symptoms began Patient has a history of an [place] bunionectomy right foot on 12/10/2015.Marland Kitchen   Review of Systems  All other systems reviewed and are negative.      Objective:   Physical Exam BP of 144/90 Pulse 69 Respiration 18 Temperature 96.6 Fahrenheit  Orientated 3  Vascular: No calf edema or calf tenderness bilaterally No edema noted right foot DP and PT pulses 2/4 bilaterally Capillary reflex immediate bilaterally  Neurological : Sensation to 10 g monofilament wire intact 5/5 bilaterally Vibratory sensation reactive bilaterally Ankle reflex equal reactive bilaterally  Dermatological: Well-healed surgical scar dorsal right first MPJ Plantar right hallux is 10 mm area of erythema without any warmth, drainage. This area is exquisitely tender to direct palpation  Musculoskeletal: Painful gait Palpable tenderness plantar right hallux when the plantar skin which is erythematous is palpated Mild tenderness on range of motion first MPJ without crepitus  X-ray examination weightbearing right foot dated 07/02/2016  Intact bony structure without a fracture and/or dislocation Well-healed osteotomy first metatarsal with retained internal screw fixation physician No increased soft tissue density noted No evidence of cortical disruption in or around the first MPJ are hallux  Radiographic impression weightbearing x-rays right foot dated 07/02/2016 Well-healed osteotomy first metatarsal with good position of retained internal screw fixation No acute bony abnormality  noted        Assessment & Plan:   Assessment: Cellulitis plantar right hallux  Plan: Rx Augmentin 875/125 by mouth twice a day 7 days one refill Instructed patient wear surgical shoe like taking antibiotics Instructed patient returned if symptoms do not respond to antibiotic

## 2016-07-02 NOTE — Patient Instructions (Addendum)
Today your exam demonstrated local area of redness and tenderness localized to the bottom of the right toe. I treating this as if his infected Begin taking Augmentin 875/125 by mouth one twice a day 7 days Resume wearing a surgical shoe on the right foot when taking antibiotics Return if the symptoms do not improve with the antibiotics

## 2016-07-07 ENCOUNTER — Ambulatory Visit: Payer: Medicare Other | Attending: Family Medicine | Admitting: Physician Assistant

## 2016-07-07 ENCOUNTER — Encounter: Payer: Self-pay | Admitting: Physician Assistant

## 2016-07-07 VITALS — BP 112/86 | HR 84 | Temp 98.6°F | Resp 18 | Ht 65.0 in | Wt 175.0 lb

## 2016-07-07 DIAGNOSIS — E669 Obesity, unspecified: Secondary | ICD-10-CM | POA: Diagnosis not present

## 2016-07-07 DIAGNOSIS — J45909 Unspecified asthma, uncomplicated: Secondary | ICD-10-CM | POA: Insufficient documentation

## 2016-07-07 DIAGNOSIS — F329 Major depressive disorder, single episode, unspecified: Secondary | ICD-10-CM | POA: Diagnosis not present

## 2016-07-07 DIAGNOSIS — G8929 Other chronic pain: Secondary | ICD-10-CM | POA: Diagnosis not present

## 2016-07-07 DIAGNOSIS — I10 Essential (primary) hypertension: Secondary | ICD-10-CM | POA: Diagnosis not present

## 2016-07-07 DIAGNOSIS — Z79899 Other long term (current) drug therapy: Secondary | ICD-10-CM | POA: Insufficient documentation

## 2016-07-07 DIAGNOSIS — R739 Hyperglycemia, unspecified: Secondary | ICD-10-CM | POA: Insufficient documentation

## 2016-07-07 DIAGNOSIS — Z8673 Personal history of transient ischemic attack (TIA), and cerebral infarction without residual deficits: Secondary | ICD-10-CM | POA: Diagnosis not present

## 2016-07-07 DIAGNOSIS — Z7982 Long term (current) use of aspirin: Secondary | ICD-10-CM | POA: Diagnosis not present

## 2016-07-07 DIAGNOSIS — F419 Anxiety disorder, unspecified: Secondary | ICD-10-CM | POA: Diagnosis not present

## 2016-07-07 DIAGNOSIS — Z131 Encounter for screening for diabetes mellitus: Secondary | ICD-10-CM | POA: Insufficient documentation

## 2016-07-07 DIAGNOSIS — M79674 Pain in right toe(s): Secondary | ICD-10-CM | POA: Insufficient documentation

## 2016-07-07 DIAGNOSIS — Z87891 Personal history of nicotine dependence: Secondary | ICD-10-CM | POA: Diagnosis not present

## 2016-07-07 LAB — GLUCOSE, POCT (MANUAL RESULT ENTRY): POC Glucose: 82 mg/dl (ref 70–99)

## 2016-07-07 LAB — POCT GLYCOSYLATED HEMOGLOBIN (HGB A1C): Hemoglobin A1C: 5.4

## 2016-07-07 NOTE — Progress Notes (Signed)
Brittany Jimenez, is a 51 y.o. female  TIR:[id number]  QPY:[id number]  DOB - 10-25-1965  Subjective:  Chief Complaint and HPI: Brittany Jimenez is a 51 y.o. female here today with R great toe pain.  Bunionectomy on R foot 11/2015.  Just saw podiatrist and they wanted her checked for Diabetes.  She is finishing a course of antibiotics(Augmentin) for the foot issue.  No problems.  Compliant with BP meds.  No CP/HA/SOB.  She says she is no longer smoking  ROS:   Constitutional:  No f/c, No night sweats, No unexplained weight loss. EENT:  No vision changes, No blurry vision, No hearing changes. No mouth, throat, or ear problems.  Respiratory: No cough, No SOB Cardiac: No CP, no palpitations GI:  No abd pain, No N/V/D. GU: No Urinary s/sx Musculoskeletal: No joint pain Neuro: No headache, no dizziness, no motor weakness.  Skin: No rash Endocrine:  No polydipsia. No polyuria.  Psych: Denies SI/HI  No problems updated.  ALLERGIES: No Known Allergies  PAST MEDICAL HISTORY: Past Medical History:  Diagnosis Date  . Anxiety   . Arthritis   . Asthma   . Chronic pain   . Depression   . Hypertension   . Obesity   . Stroke [facility])    Pt unsure when had them/ has hx of 2 strokes per CT scan    MEDICATIONS AT HOME: Prior to Admission medications   Medication Sig Start Date End Date Taking? Authorizing Provider  acetaminophen-codeine (TYLENOL #4) 300-60 MG tablet Take 1 tablet by mouth daily as needed for moderate pain. 05/29/16   Funches, Adriana Mccallum, MD  albuterol (PROVENTIL HFA;VENTOLIN HFA) 108 (90 Base) MCG/ACT inhaler Inhale 2 puffs into the lungs every 6 (six) hours as needed. For shortness of breath 11/08/15   Boykin Nearing, MD  aspirin 81 MG tablet Take 1 tablet (81 mg total) by mouth daily. 04/25/16   Funches, Adriana Mccallum, MD  busPIRone (BUSPAR) 5 MG tablet Take 1 tablet (5 mg total) by mouth 2 (two) times daily. 01/22/16   Funches, Adriana Mccallum, MD  clonazePAM (KLONOPIN) 1 MG  tablet Take 1 tablet (1 mg total) by mouth at bedtime. 06/12/16   Funches, Adriana Mccallum, MD  docusate sodium (COLACE) 100 MG capsule Take 100 mg by mouth daily as needed for mild constipation. 12/10/15   Landis Martins, DPM  hydrochlorothiazide (HYDRODIURIL) 25 MG tablet Take 1 tablet (25 mg total) by mouth daily. 04/25/16   Funches, Adriana Mccallum, MD  traZODone (DESYREL) 100 MG tablet Take 1 tablet (100 mg total) by mouth at bedtime. 05/29/16   Boykin Nearing, MD  venlafaxine XR (EFFEXOR XR) 37.5 MG 24 hr capsule Take one tablet daily for one week, then two tabs daily 06/12/16   Funches, Adriana Mccallum, MD  zolpidem (AMBIEN) 10 MG tablet Take 1 tablet (10 mg total) by mouth at bedtime as needed for sleep. 05/29/16 06/28/16  Boykin Nearing, MD     Objective:  EXAM:   Vitals:   07/07/16 1033  BP: 112/86  Pulse: 84  Resp: 18  Temp: 98.6 F (37 C)  TempSrc: Oral  SpO2: 100%  Weight: 175 lb (79.4 kg)  Height: 5\' 5"  (1.651 m)    General appearance : A&OX3. NAD. Non-toxic-appearing HEENT: Atraumatic and Normocephalic.  PERRLA. EOM intact.  Neck: supple, no JVD. No cervical lymphadenopathy. No thyromegaly Chest/Lungs:  Breathing-non-labored, Good air entry bilaterally, breath sounds normal without rales, rhonchi, or wheezing  CVS: S1 S2 regular, no murmurs, gallops, rubs  Extremities: Bilateral Lower Ext  shows no edema, both legs are warm to touch with = pulse throughout R great toe-medially, there is a denuded area of skin that is sensitive and tender.  No infection or broken skin. Neurology:  CN II-XII grossly intact, Non focal.   Psych:  TP linear. J/I WNL. Normal speech. Appropriate eye contact and affect.  Skin:  No Rash  Data Review Lab Results  Component Value Date   HGBA1C 5.4 07/07/2016   HGBA1C 5.30 12/06/2014   HGBA1C 4.9 05/13/2012     Assessment & Plan   1. Hypertension, essential, benign Controlled-continue current regimen - Comprehensive metabolic panel - CBC with  Differential/Platelet  2. Diabetes mellitus screening - POCT A1C  3. Hyperglycemia Normal glucose and A1C today - Glucose (CBG)  4. Pain of toe of right foot Taught and encouraged negative space padding to help with displacing pressure on toes in shoes     Patient have been counseled extensively about nutrition and exercise  Return in about 3 months (around 10/07/2016) for assign new PCP; htn.  The patient was given clear instructions to go to ER or return to medical center if symptoms don't improve, worsen or new problems develop. The patient verbalized understanding. The patient was told to call to get lab results if they haven't heard anything in the next week.     Freeman Caldron, PA-C [facility] [place], [place]   07/07/2016, 10:44 AMPatient ID: Corry Storie, female   DOB: [birth year], 51 y.o.   MRN: [id number]

## 2016-07-08 ENCOUNTER — Telehealth: Payer: Self-pay | Admitting: Family Medicine

## 2016-07-08 LAB — COMPREHENSIVE METABOLIC PANEL
ALT: 30 IU/L (ref 0–32)
AST: 27 IU/L (ref 0–40)
Albumin/Globulin Ratio: 1.7 (ref 1.2–2.2)
Albumin: 4.7 g/dL (ref 3.5–5.5)
Alkaline Phosphatase: 101 IU/L (ref 39–117)
BUN/Creatinine Ratio: 15 (ref 9–23)
BUN: 14 mg/dL (ref 6–24)
Bilirubin Total: <0.2 mg/dL (ref 0.0–1.2)
CALCIUM: 10.1 mg/dL (ref 8.7–10.2)
CO2: 26 mmol/L (ref 20–29)
Chloride: 101 mmol/L (ref 96–106)
Creatinine, Ser: 0.91 mg/dL (ref 0.57–1.00)
GFR calc Af Amer: 83 mL/min/{1.73_m2} (ref >59)
GFR, EST NON AFRICAN AMERICAN: 72 mL/min/{1.73_m2} (ref >59)
GLOBULIN, TOTAL: 2.8 g/dL (ref 1.5–4.5)
Glucose: 90 mg/dL (ref 65–99)
Potassium: 4 mmol/L (ref 3.5–5.2)
SODIUM: 141 mmol/L (ref 134–144)
Total Protein: 7.5 g/dL (ref 6.0–8.5)

## 2016-07-08 LAB — CBC WITH DIFFERENTIAL/PLATELET
BASOS: 0 %
Basophils Absolute: 0 10*3/uL (ref 0.0–0.2)
EOS (ABSOLUTE): 0.5 10*3/uL — ABNORMAL HIGH (ref 0.0–0.4)
EOS: 5 %
HEMATOCRIT: 42.5 % (ref 34.0–46.6)
HEMOGLOBIN: 13.8 g/dL (ref 11.1–15.9)
IMMATURE GRANS (ABS): 0 10*3/uL (ref 0.0–0.1)
IMMATURE GRANULOCYTES: 0 %
LYMPHS: 34 %
Lymphocytes Absolute: 3.1 10*3/uL (ref 0.7–3.1)
MCH: 26.8 pg (ref 26.6–33.0)
MCHC: 32.5 g/dL (ref 31.5–35.7)
MCV: 83 fL (ref 79–97)
MONOCYTES: 7 %
Monocytes Absolute: 0.7 10*3/uL (ref 0.1–0.9)
NEUTROS PCT: 54 %
Neutrophils Absolute: 4.9 10*3/uL (ref 1.4–7.0)
Platelets: 268 10*3/uL (ref 150–379)
RBC: 5.15 x10E6/uL (ref 3.77–5.28)
RDW: 14.3 % (ref 12.3–15.4)
WBC: 9.1 10*3/uL (ref 3.4–10.8)

## 2016-07-08 NOTE — Telephone Encounter (Signed)
PT called to speak with the nurse, since she saw her yesterday and she has some question that the nurse can answer, please call her back

## 2016-07-09 NOTE — Telephone Encounter (Signed)
Patient verified DOB Patient is aware of all blood work being normal. Patient had no further questions at this time.

## 2016-07-09 NOTE — Telephone Encounter (Signed)
-----   Message from Argentina Donovan, [place] sent at 07/08/2016  8:39 AM EDT ----- Please call patient and let her know that her sugar, kidney function, liver function, electrolytes, and blood count are all normal. Follow-up as planned. Thanks, Freeman Caldron, PA-C

## 2016-07-22 ENCOUNTER — Other Ambulatory Visit: Payer: Self-pay | Admitting: Family Medicine

## 2016-07-22 DIAGNOSIS — R928 Other abnormal and inconclusive findings on diagnostic imaging of breast: Secondary | ICD-10-CM

## 2016-07-23 MED FILL — ?TRAZODONE 100 MG TABLET: 100 | 30 days supply | Qty: 30 | Fill #1

## 2016-07-25 MED FILL — ZOLPIDEM TARTRATE 10 MG TAB: 10 | 30 days supply | Qty: 30 | Fill #1

## 2016-07-28 ENCOUNTER — Ambulatory Visit
Admission: RE | Admit: 2016-07-28 | Discharge: 2016-07-28 | Disposition: A | Discharge status: Home / Self Care | Payer: Medicare Other | Source: Ambulatory Visit | Attending: Family Medicine | Admitting: Family Medicine

## 2016-07-28 DIAGNOSIS — R928 Other abnormal and inconclusive findings on diagnostic imaging of breast: Secondary | ICD-10-CM

## 2016-07-31 ENCOUNTER — Ambulatory Visit: Payer: Medicare Other | Admitting: Family Medicine

## 2016-08-05 ENCOUNTER — Ambulatory Visit: Payer: Medicare Other | Admitting: Sports Medicine

## 2016-08-12 ENCOUNTER — Emergency Department (HOSPITAL_COMMUNITY): Payer: Medicare Other

## 2016-08-12 ENCOUNTER — Emergency Department (HOSPITAL_COMMUNITY)
Admission: EM | Admit: 2016-08-12 | Discharge: 2016-08-12 | Disposition: A | Discharge status: Home / Self Care | Payer: Medicare Other | Attending: Emergency Medicine | Admitting: Emergency Medicine

## 2016-08-12 ENCOUNTER — Encounter (HOSPITAL_COMMUNITY): Payer: Self-pay | Admitting: Emergency Medicine

## 2016-08-12 DIAGNOSIS — M79671 Pain in right foot: Secondary | ICD-10-CM | POA: Diagnosis not present

## 2016-08-12 DIAGNOSIS — J45909 Unspecified asthma, uncomplicated: Secondary | ICD-10-CM | POA: Insufficient documentation

## 2016-08-12 DIAGNOSIS — Z8673 Personal history of transient ischemic attack (TIA), and cerebral infarction without residual deficits: Secondary | ICD-10-CM | POA: Diagnosis not present

## 2016-08-12 DIAGNOSIS — I10 Essential (primary) hypertension: Secondary | ICD-10-CM | POA: Diagnosis not present

## 2016-08-12 DIAGNOSIS — Z7982 Long term (current) use of aspirin: Secondary | ICD-10-CM | POA: Insufficient documentation

## 2016-08-12 DIAGNOSIS — Z87891 Personal history of nicotine dependence: Secondary | ICD-10-CM | POA: Insufficient documentation

## 2016-08-12 DIAGNOSIS — L259 Unspecified contact dermatitis, unspecified cause: Secondary | ICD-10-CM

## 2016-08-12 DIAGNOSIS — Z79899 Other long term (current) drug therapy: Secondary | ICD-10-CM | POA: Insufficient documentation

## 2016-08-12 MED ORDER — OXYCODONE-ACETAMINOPHEN 5-325 MG PO TABS
1.0000 | ORAL_TABLET | Freq: Once | ORAL | Status: AC
  Administered 2016-08-12: 1 via ORAL
  Filled 2016-08-12: qty 1

## 2016-08-12 MED ORDER — IBUPROFEN 600 MG PO TABS: 600.0000 mg | ORAL_TABLET | Freq: Four times a day (QID) | ORAL | 0 refills | Status: DC | PRN

## 2016-08-12 MED ORDER — HYDROCORTISONE 1 % EX CREA: TOPICAL_CREAM | CUTANEOUS | 0 refills | Status: DC

## 2016-08-12 MED ORDER — TRAMADOL HCL 50 MG PO TABS: 50.0000 mg | ORAL_TABLET | Freq: Four times a day (QID) | ORAL | 0 refills | Status: DC | PRN

## 2016-08-12 NOTE — Discharge Instructions (Signed)
Please read and follow all provided instructions.  Your diagnoses today include:  1. Right foot pain     Tests performed today include: Vital signs. See below for your results today.   Medications prescribed:  Take as prescribed   Home care instructions:  Follow any educational materials contained in this packet.  Follow-up instructions: Please follow-up with Podiatry for further evaluation of symptoms and treatment   Return instructions:  Please return to the Emergency Department if you do not get better, if you get worse, or new symptoms OR  - Fever (temperature greater than 101.55F)  - Bleeding that does not stop with holding pressure to the area    -Severe pain (please note that you may be more sore the day after your accident)  - Chest Pain  - Difficulty breathing  - Severe nausea or vomiting  - Inability to tolerate food and liquids  - Passing out  - Skin becoming red around your wounds  - Change in mental status (confusion or lethargy)  - New numbness or weakness    Please return if you have any other emergent concerns.  Additional Information:  Your vital signs today were: BP (!) 156/94    Pulse 73    Temp 98.8 F (37.1 C) (Oral)    Resp 16    Ht 5\' 3"  (1.6 m)    Wt 79.4 kg (175 lb)    LMP 01/29/2013    SpO2 100%    BMI 31.00 kg/m  If your blood pressure (BP) was elevated above 135/85 this visit, please have this repeated by your doctor within one month. ---------------

## 2016-08-12 NOTE — ED Provider Notes (Signed)
[facility] DEPT Provider Note   CSN: [id number] Arrival date & time: 08/12/16  1439     History   Chief Complaint Chief Complaint  Patient presents with  . Foot Pain    HPI Brittany Jimenez is a 51 y.o. female.  HPI  51 y.o. female with a hx of HTN, presents to the Emergency Department today due to right medial foot pain. States ongoing for several weeks. Worse in the past week. No numbness/tingling. Rates pain 10/10. Worse with ambulation. States most tenderness on MTP of 1st digit. No swelling. No erythema. Minimal pain at rest. Tylenol with minimal relief. No fevers. Marland Kitchen t also ntoed left arm rash on forearm x 3 weeks. Itching. No pain. No erythema. No fevers. No meds PTA. Denies contact with oil based plants. No other symptoms noted    Past Medical History:  Diagnosis Date  . Anxiety   . Arthritis   . Asthma   . Chronic pain   . Depression   . Hypertension   . Obesity   . Stroke [facility])    Pt unsure when had them/ has hx of 2 strokes per CT scan    Patient Active Problem List   Diagnosis Date Noted  . Hot flashes due to menopause 05/13/2016  . Abnormal screening mammogram 07/23/2015  . Pain of right great toe 06/25/2015  . Chronic ischemic multifocal multiple vascular territories stroke 03/05/2015  . Remote history of stroke 02/19/2015  . Hypertrophy of nasal turbinates 02/19/2015  . Left homonymous hemianopsia 02/08/2015  . Paresthesia of right foot 02/08/2015  . Numbness of foot 12/06/2014  . History of gout 11/01/2013  . Chronic pain of left knee 09/13/2013  . Anxiety 08/09/2013  . Arthralgia of both knees 08/09/2013  . Insomnia 08/09/2013  . MDD (major depressive disorder) ([place]) 07/22/2013  . Smoking 03/15/2013  . DUB (dysfunctional uterine bleeding) 06/29/2012  . Pain in joint, ankle and foot 06/21/2012  . Pain in left knee 04/28/2012  . Arthritis 01/09/2012  . Asthma, mild intermittent 01/09/2012  . Hypertension, essential, benign 01/09/2012  .  Chronic pain     Past Surgical History:  Procedure Laterality Date  . APPENDECTOMY  2001  . BUNIONECTOMY  12/10/2015   right foot  . KNEE ARTHROSCOPY WITH MEDIAL MENISECTOMY Left 01/31/2015   Procedure: LEFT KNEE ARTHROSCOPY WITH PARTIAL MEDIAL MENISCECTOMY;  Surgeon: Leandrew Koyanagi, MD;  Location: [facility];  Service: Orthopedics;  Laterality: Left;  . SYNOVECTOMY Left 01/31/2015   Procedure: SYNOVECTOMY;  Surgeon: Leandrew Koyanagi, MD;  Location: [facility];  Service: Orthopedics;  Laterality: Left;  . TUBAL LIGATION  1995    OB History    No data available       Home Medications    Prior to Admission medications   Medication Sig Start Date End Date Taking? Authorizing Provider  acetaminophen-codeine (TYLENOL #4) 300-60 MG tablet Take 1 tablet by mouth daily as needed for moderate pain. 05/29/16   Funches, Adriana Mccallum, MD  albuterol (PROVENTIL HFA;VENTOLIN HFA) 108 (90 Base) MCG/ACT inhaler Inhale 2 puffs into the lungs every 6 (six) hours as needed. For shortness of breath 11/08/15   Boykin Nearing, MD  aspirin 81 MG tablet Take 1 tablet (81 mg total) by mouth daily. 04/25/16   Funches, Adriana Mccallum, MD  busPIRone (BUSPAR) 5 MG tablet Take 1 tablet (5 mg total) by mouth 2 (two) times daily. 01/22/16   Funches, Adriana Mccallum, MD  clonazePAM (KLONOPIN) 1 MG tablet Take 1  tablet (1 mg total) by mouth at bedtime. 06/12/16   Funches, Adriana Mccallum, MD  docusate sodium (COLACE) 100 MG capsule Take 100 mg by mouth daily as needed for mild constipation. 12/10/15   Landis Martins, DPM  hydrochlorothiazide (HYDRODIURIL) 25 MG tablet Take 1 tablet (25 mg total) by mouth daily. 04/25/16   Funches, Adriana Mccallum, MD  traZODone (DESYREL) 100 MG tablet Take 1 tablet (100 mg total) by mouth at bedtime. 05/29/16   Boykin Nearing, MD  venlafaxine XR (EFFEXOR XR) 37.5 MG 24 hr capsule Take one tablet daily for one week, then two tabs daily 06/12/16   Funches, Adriana Mccallum, MD  zolpidem (AMBIEN) 10 MG tablet  Take 1 tablet (10 mg total) by mouth at bedtime as needed for sleep. 05/29/16 06/28/16  Boykin Nearing, MD    Family History Family History  Problem Relation Age of Onset  . Heart disease Mother   . Early death Mother   . Asthma Mother   . Diabetes Mother   . Hypertension Mother   . Cancer Father   . Alcohol abuse Father   . Early death Father   . Hypertension Father   . Asthma Sister   . Cancer Sister   . Hypertension Sister   . Depression Brother   . Drug abuse Brother   . Hypertension Brother     Social History Social History  Substance Use Topics  . Smoking status: Former Smoker    Packs/day: 0.00    Years: 35.00    Types: Cigarettes    Quit date: 06/12/2016  . Smokeless tobacco: Never Used     Comment: 1 month  . Alcohol use No     Allergies   Patient has no known allergies.   Review of Systems Review of Systems ROS reviewed and all are negative for acute change except as noted in the HPI.  Physical Exam Updated Vital Signs BP (!) 142/60   Pulse (!) 58   Temp 98.8 F (37.1 C) (Oral)   Resp 18   Ht 5\' 3"  (1.6 m)   Wt 79.4 kg (175 lb)   LMP 01/29/2013   SpO2 99%   BMI 31.00 kg/m   Physical Exam  Constitutional: She is oriented to person, place, and time. Vital signs are normal. She appears well-developed and well-nourished.  HENT:  Head: Normocephalic.  Right Ear: Hearing normal.  Left Ear: Hearing normal.  Eyes: Pupils are equal, round, and reactive to light. Conjunctivae and EOM are normal.  Cardiovascular: Normal rate and regular rhythm.   Pulmonary/Chest: Effort normal.  Musculoskeletal:  TTP along right MTP of great toe. No swelling. No erythema. ROM intact. Cap refill <2sec. No obvious signs of infection  Neurological: She is alert and oriented to person, place, and time.  Skin: Skin is warm and dry.  Contact dermatitis noted on left forearm. No erythema. Mild excoriations. No signs of infection   Psychiatric: She has a normal mood and  affect. Her speech is normal and behavior is normal. Thought content normal.     ED Treatments / Results  Labs (all labs ordered are listed, but only abnormal results are displayed) Labs Reviewed - No data to display  EKG  EKG Interpretation None       Radiology Dg Foot Complete Right  Result Date: 08/12/2016 CLINICAL DATA:  Medial foot pain EXAM: RIGHT FOOT COMPLETE - 3+ VIEW COMPARISON:  The radiograph 07/02/2016 FINDINGS: The patient is status post corrective osteotomy of the right first metatarsal. There is a  partially threaded lag screw at this location. No fracture or dislocation. No abnormal hardware lucency. IMPRESSION: No acute abnormality of the right foot. Electronically Signed   By: Ulyses Jarred M.D.   On: 08/12/2016 18:23    Procedures Procedures (including critical care time)  Medications Ordered in ED Medications - No data to display   Initial Impression / Assessment and Plan / ED Course  I have reviewed the triage vital signs and the nursing notes.  Pertinent labs & imaging results that were available during my care of the patient were reviewed by me and considered in my medical decision making (see chart for details).  Final Clinical Impressions(s) / ED Diagnoses   {I have reviewed and evaluated the relevant imaging studies.  {I have reviewed the relevant previous healthcare records.  {I obtained HPI from historian.   ED Course:  Assessment: Pt is a 51 y.o. female with a hx of HTN, presents to the Emergency Department today due to right medial foot pain. States ongoing for several weeks. Worse in the past week. No numbness/tingling. Rates pain 10/10. Worse with ambulation. States most tenderness on MTP of 1st digit. No swelling. No erythema. Minimal pain at rest. Tylenol with minimal relief. No fevers. Marland Kitchen t also ntoed left arm rash on forearm x 3 weeks. Itching. No pain. No erythema. No fevers. No meds PTA. Denies contact with oil based plants. On exam, pt in  NAD. Nontoxic/nonseptic appearing. VSS. Afebrile. TTP along right MTP of great toe. No swelling. No erythema. ROM intact. Cap refill <2sec. No obvious signs of infection. Doubt gout. Doubt infection. Musculoskeletal?  Given analegsia in ED. Given topical steroid for contact dermatitis. Plan is to DC home with follow up to Podiatry. At time of discharge, Patient is in no acute distress. Vital Signs are stable. Patient is able to ambulate. Patient able to tolerate PO.    Disposition/Plan:  DC Home Additional Verbal discharge instructions given and discussed with patient.  Pt Instructed to f/u with PCP in the next week for evaluation and treatment of symptoms. Return precautions given Pt acknowledges and agrees with plan  Supervising Physician Fredia Sorrow, MD  Final diagnoses:  Right foot pain  Contact dermatitis, unspecified contact dermatitis type, unspecified trigger    New Prescriptions New Prescriptions   No medications on file     Shary Decamp, Hershal Coria 08/12/16 2328    Fredia Sorrow, MD 08/16/16 [phone]

## 2016-08-12 NOTE — ED Notes (Signed)
Pt refusing crutches, states she has some at home

## 2016-08-12 NOTE — ED Triage Notes (Signed)
Pt. Stated, I woke up and my rt. Foot on the medial foot is tight and painful. I can't hardly walk

## 2016-08-12 NOTE — ED Notes (Signed)
Pt ambulatory to restroom with assistance.

## 2016-08-12 NOTE — ED Notes (Signed)
Pt approached nurses station asking for an xray.

## 2016-08-12 NOTE — ED Notes (Signed)
Pt given ice pack for R foot

## 2016-08-19 ENCOUNTER — Ambulatory Visit (INDEPENDENT_AMBULATORY_CARE_PROVIDER_SITE_OTHER): Payer: Medicare Other | Admitting: Sports Medicine

## 2016-08-19 DIAGNOSIS — M779 Enthesopathy, unspecified: Secondary | ICD-10-CM | POA: Diagnosis not present

## 2016-08-19 DIAGNOSIS — F17200 Nicotine dependence, unspecified, uncomplicated: Secondary | ICD-10-CM | POA: Diagnosis not present

## 2016-08-19 DIAGNOSIS — M79671 Pain in right foot: Secondary | ICD-10-CM | POA: Diagnosis not present

## 2016-08-19 MED ORDER — METHYLPREDNISOLONE 4 MG PO TBPK: ORAL_TABLET | ORAL | 0 refills | Status: DC

## 2016-08-19 MED FILL — traZODone HCL 100 MG TABS: 100 | 30 days supply | Qty: 30 | Fill #2

## 2016-08-19 NOTE — Progress Notes (Signed)
Subjective: Brittany Jimenez is a 51 y.o. female patient seen today in office for POV #6 (DOS 12-10-15), S/P Right [place] Bunionectomy with screw fixation. Patient admits pain and swelling underneath big toe joint that has been happening since June 2018. Saw Dr. Amalia Hailey who gave Augmentin with no improvement and went to ER where they just gave her steroid cream with no improvement.  +smoker  Patient Active Problem List   Diagnosis Date Noted  . Hot flashes due to menopause 05/13/2016  . Abnormal screening mammogram 07/23/2015  . Pain of right great toe 06/25/2015  . Chronic ischemic multifocal multiple vascular territories stroke 03/05/2015  . Remote history of stroke 02/19/2015  . Hypertrophy of nasal turbinates 02/19/2015  . Left homonymous hemianopsia 02/08/2015  . Paresthesia of right foot 02/08/2015  . Numbness of foot 12/06/2014  . History of gout 11/01/2013  . Chronic pain of left knee 09/13/2013  . Anxiety 08/09/2013  . Arthralgia of both knees 08/09/2013  . Insomnia 08/09/2013  . MDD (major depressive disorder) ([place]) 07/22/2013  . Smoking 03/15/2013  . DUB (dysfunctional uterine bleeding) 06/29/2012  . Pain in joint, ankle and foot 06/21/2012  . Pain in left knee 04/28/2012  . Arthritis 01/09/2012  . Asthma, mild intermittent 01/09/2012  . Hypertension, essential, benign 01/09/2012  . Chronic pain     Current Outpatient Prescriptions on File Prior to Visit  Medication Sig Dispense Refill  . acetaminophen-codeine (TYLENOL #4) 300-60 MG tablet Take 1 tablet by mouth daily as needed for moderate pain. 30 tablet 2  . albuterol (PROVENTIL HFA;VENTOLIN HFA) 108 (90 Base) MCG/ACT inhaler Inhale 2 puffs into the lungs every 6 (six) hours as needed. For shortness of breath 1 Inhaler 5  . aspirin 81 MG tablet Take 1 tablet (81 mg total) by mouth daily. 30 tablet 11  . busPIRone (BUSPAR) 5 MG tablet Take 1 tablet (5 mg total) by mouth 2 (two) times daily. 60 tablet 5  .  clonazePAM (KLONOPIN) 1 MG tablet Take 1 tablet (1 mg total) by mouth at bedtime. 21 tablet 0  . docusate sodium (COLACE) 100 MG capsule Take 100 mg by mouth daily as needed for mild constipation.    . hydrochlorothiazide (HYDRODIURIL) 25 MG tablet Take 1 tablet (25 mg total) by mouth daily. 30 tablet 5  . hydrocortisone cream 1 % Apply to affected area 2 times daily 15 g 0  . ibuprofen (ADVIL,MOTRIN) 600 MG tablet Take 1 tablet (600 mg total) by mouth every 6 (six) hours as needed. 30 tablet 0  . traMADol (ULTRAM) 50 MG tablet Take 1 tablet (50 mg total) by mouth every 6 (six) hours as needed. 15 tablet 0  . traZODone (DESYREL) 100 MG tablet Take 1 tablet (100 mg total) by mouth at bedtime. 30 tablet 5  . venlafaxine XR (EFFEXOR XR) 37.5 MG 24 hr capsule Take one tablet daily for one week, then two tabs daily 60 capsule 0  . zolpidem (AMBIEN) 10 MG tablet Take 1 tablet (10 mg total) by mouth at bedtime as needed for sleep. 30 tablet 5   Current Facility-Administered Medications on File Prior to Visit  Medication Dose Route Frequency Provider Last Rate Last Dose  . 0.9 %  sodium chloride infusion  500 mL Intravenous Continuous Armbruster, Renelda Loma, MD        No Known Allergies  Objective: There were no vitals filed for this visit.  General: No acute distress, AAOx3  Right foot: Incision well healed with mild scar,  no gapping or dehiscence at surgical site, mild swelling to right forefoot, pain sub met 1 on right, no erythema, no warmth, no drainage, no signs of infection noted, Capillary fill time <3 seconds in all digits, gross sensation present via light touch to right foot. + pain or crepitation with range of motion at 1st MTPJ.  No pain with calf compression.   Assessment and Plan:  Problem List Items Addressed This Visit    None    Visit Diagnoses    Capsulitis    -  Primary   Relevant Medications   methylPREDNISolone (MEDROL DOSEPAK) 4 MG TBPK tablet   Right foot pain        Smoker          -Patient seen and evaluated -Previous xrays reviewed  -Rx Medrol dose pak to take as instructed  -Recommend patient to return to CAM boot  -Advised patient activities as tolerated -Advised patient to ice and elevate as necessary  -Return in 2 weeks for follow up if no better will order MRI. In the meantime, patient to call office if any issues or problems arise.   Landis Martins, DPM

## 2016-08-20 MED FILL — ?METHYLPREDNISOLONE 4 MG TA: 4 | 6 days supply | Qty: 21 | Fill #0

## 2016-08-22 MED FILL — ZOLPIDEM TARTRATE 10 MG TAB: 10 | 30 days supply | Qty: 30 | Fill #2

## 2016-08-25 ENCOUNTER — Ambulatory Visit: Payer: Medicare Other | Attending: Internal Medicine | Admitting: Physician Assistant

## 2016-08-25 VITALS — BP 169/84 | HR 58 | Temp 98.8°F | Resp 20 | Ht 63.0 in | Wt 172.4 lb

## 2016-08-25 DIAGNOSIS — L298 Other pruritus: Secondary | ICD-10-CM | POA: Diagnosis present

## 2016-08-25 DIAGNOSIS — Z8673 Personal history of transient ischemic attack (TIA), and cerebral infarction without residual deficits: Secondary | ICD-10-CM | POA: Diagnosis not present

## 2016-08-25 DIAGNOSIS — I10 Essential (primary) hypertension: Secondary | ICD-10-CM | POA: Insufficient documentation

## 2016-08-25 DIAGNOSIS — Z7982 Long term (current) use of aspirin: Secondary | ICD-10-CM | POA: Insufficient documentation

## 2016-08-25 DIAGNOSIS — L309 Dermatitis, unspecified: Secondary | ICD-10-CM | POA: Insufficient documentation

## 2016-08-25 DIAGNOSIS — G8929 Other chronic pain: Secondary | ICD-10-CM | POA: Diagnosis not present

## 2016-08-25 DIAGNOSIS — Z79899 Other long term (current) drug therapy: Secondary | ICD-10-CM | POA: Insufficient documentation

## 2016-08-25 MED ORDER — TRIAMCINOLONE ACETONIDE 0.1 % EX CREA: 1.0000 "application " | TOPICAL_CREAM | Freq: Two times a day (BID) | CUTANEOUS | 0 refills | Status: DC

## 2016-08-25 MED ORDER — CETIRIZINE HCL 10 MG PO TABS: 10.0000 mg | ORAL_TABLET | Freq: Every day | ORAL | 11 refills | Status: DC

## 2016-08-25 NOTE — Progress Notes (Signed)
Rash on left arm Itching Noticed for several weeks

## 2016-08-25 NOTE — Progress Notes (Signed)
Patient ID: Brittany Jimenez, female   DOB: 02-25-1965, 51 y.o.   MRN: [id number]   Brittany Jimenez, is a 51 y.o. female  IEP:[id number]  YSA:[id number]  DOB - 1966-01-12  Subjective:  Chief Complaint and HPI: Brittany Jimenez is a 51 y.o. female here today for pruritic rash L arm only.  She was told to get OTC 1% hydrocortisone cream which is not helping.  She is on prednisone for a foot problem and this hasn't seem to help her arm.  She denies PI/PO exposure.  No new soaps/creams/detergents.  No f/c.  No rash elsewhere.  She hasn't taken her BP meds today.    ROS:   Constitutional:  No f/c, No night sweats, No unexplained weight loss. EENT:  No vision changes, No blurry vision, No hearing changes. No mouth, throat, or ear problems.  Respiratory: No cough, No SOB Cardiac: No CP, no palpitations GI:  No abd pain, No N/V/D. GU: No Urinary s/sx Musculoskeletal: No joint pain Neuro: No headache, no dizziness, no motor weakness.  Skin: + rash Endocrine:  No polydipsia. No polyuria.  Psych: Denies SI/HI  No problems updated.  ALLERGIES: No Known Allergies  PAST MEDICAL HISTORY: Past Medical History:  Diagnosis Date  . Anxiety   . Arthritis   . Asthma   . Chronic pain   . Depression   . Hypertension   . Obesity   . Stroke [facility])    Pt unsure when had them/ has hx of 2 strokes per CT scan    MEDICATIONS AT HOME: Prior to Admission medications   Medication Sig Start Date End Date Taking? Authorizing Provider  acetaminophen-codeine (TYLENOL #4) 300-60 MG tablet Take 1 tablet by mouth daily as needed for moderate pain. 05/29/16  Yes Funches, Josalyn, MD  albuterol (PROVENTIL HFA;VENTOLIN HFA) 108 (90 Base) MCG/ACT inhaler Inhale 2 puffs into the lungs every 6 (six) hours as needed. For shortness of breath 11/08/15  Yes Funches, Josalyn, MD  aspirin 81 MG tablet Take 1 tablet (81 mg total) by mouth daily. 04/25/16  Yes Funches, Josalyn, MD  busPIRone (BUSPAR) 5 MG  tablet Take 1 tablet (5 mg total) by mouth 2 (two) times daily. 01/22/16  Yes Funches, Josalyn, MD  hydrochlorothiazide (HYDRODIURIL) 25 MG tablet Take 1 tablet (25 mg total) by mouth daily. 04/25/16  Yes Funches, Josalyn, MD  ibuprofen (ADVIL,MOTRIN) 600 MG tablet Take 1 tablet (600 mg total) by mouth every 6 (six) hours as needed. 08/12/16  Yes Shary Decamp, PA-C  methylPREDNISolone (MEDROL DOSEPAK) 4 MG TBPK tablet Take as instructed 08/19/16  Yes Stover, Titorya, DPM  traMADol (ULTRAM) 50 MG tablet Take 1 tablet (50 mg total) by mouth every 6 (six) hours as needed. 08/12/16  Yes Shary Decamp, PA-C  traZODone (DESYREL) 100 MG tablet Take 1 tablet (100 mg total) by mouth at bedtime. 05/29/16  Yes Funches, Josalyn, MD  cetirizine (ZYRTEC) 10 MG tablet Take 1 tablet (10 mg total) by mouth daily. 08/25/16   Argentina Donovan, PA-C  clonazePAM (KLONOPIN) 1 MG tablet Take 1 tablet (1 mg total) by mouth at bedtime. Patient not taking: Reported on 08/25/2016 06/12/16   Boykin Nearing, MD  docusate sodium (COLACE) 100 MG capsule Take 100 mg by mouth daily as needed for mild constipation. 12/10/15   Landis Martins, DPM  hydrocortisone cream 1 % Apply to affected area 2 times daily Patient not taking: Reported on 08/25/2016 08/12/16   Shary Decamp, PA-C  triamcinolone cream (KENALOG) 0.1 % Apply 1 application  topically 2 (two) times daily. Prn itching 08/25/16   Argentina Donovan, PA-C  venlafaxine XR (EFFEXOR XR) 37.5 MG 24 hr capsule Take one tablet daily for one week, then two tabs daily Patient not taking: Reported on 08/25/2016 06/12/16   Boykin Nearing, MD  zolpidem (AMBIEN) 10 MG tablet Take 1 tablet (10 mg total) by mouth at bedtime as needed for sleep. 05/29/16 06/28/16  Boykin Nearing, MD     Objective:  EXAM:   Vitals:   08/25/16 1139  BP: (!) 169/84  Pulse: (!) 58  Resp: 20  Temp: 98.8 F (37.1 C)  TempSrc: Oral  SpO2: 100%  Weight: 172 lb 6.4 oz (78.2 kg)  Height: 5\' 3"  (1.6 m)    General  appearance : A&OX3. NAD. Non-toxic-appearing HEENT: Atraumatic and Normocephalic.  PERRLA. EOM intact.  Neck: supple, no JVD. No cervical lymphadenopathy. No thyromegaly Chest/Lungs:  Breathing-non-labored, Good air entry bilaterally, breath sounds normal without rales, rhonchi, or wheezing  CVS: S1 S2 regular, no murmurs, gallops, rubs  Extremities: Bilateral Lower Ext shows no edema, both legs are warm to touch with = pulse throughout.  L arm in no predictable pattern; there are some areas of dry skin.  No vesicular lesions.  No infection.   Neurology:  CN II-XII grossly intact, Non focal.   Psych:  TP linear. J/I WNL. Normal speech. Appropriate eye contact and affect.  Skin:  No Rash  Data Review Lab Results  Component Value Date   HGBA1C 5.4 07/07/2016   HGBA1C 5.30 12/06/2014   HGBA1C 4.9 05/13/2012     Assessment & Plan   1. Hypertension, essential, benign Uncontrolled but didn't take meds today.  Continue current medications and check BP OOO. We have discussed target BP range and blood pressure goal. I have advised patient to check BP regularly and to call us back or report to clinic if the numbers are consistently higher than 140/90. We discussed the importance of compliance with medical therapy and DASH diet recommended, consequences of uncontrolled hypertension discussed.   2. Dermatitis Unsure etiology/?eczema - cetirizine (ZYRTEC) 10 MG tablet; Take 1 tablet (10 mg total) by mouth daily.  Dispense: 30 tablet; Refill: 11 - triamcinolone cream (KENALOG) 0.1 %; Apply 1 application topically 2 (two) times daily. Prn itching  Dispense: 30 g; Refill: 0  Patient have been counseled extensively about nutrition and exercise  Return if symptoms worsen or fail to improve.  The patient was given clear instructions to go to ER or return to medical center if symptoms don't improve, worsen or new problems develop. The patient verbalized understanding. The patient was told to call to  get lab results if they haven't heard anything in the next week.     Freeman Caldron, PA-C [facility] [place] [facility] [place], [place]   08/25/2016, 1:08 PM

## 2016-09-02 ENCOUNTER — Ambulatory Visit: Payer: Medicare Other | Admitting: Sports Medicine

## 2016-10-07 ENCOUNTER — Ambulatory Visit: Payer: Medicare Other | Admitting: Internal Medicine

## 2017-01-01 ENCOUNTER — Other Ambulatory Visit: Payer: Self-pay | Admitting: Internal Medicine

## 2017-01-02 ENCOUNTER — Ambulatory Visit: Payer: Medicare Other | Admitting: Internal Medicine

## 2017-01-30 ENCOUNTER — Ambulatory Visit: Payer: Medicare Other | Admitting: Internal Medicine

## 2017-02-09 ENCOUNTER — Other Ambulatory Visit: Payer: Self-pay | Admitting: Internal Medicine

## 2017-02-09 ENCOUNTER — Encounter: Payer: Self-pay | Admitting: Internal Medicine

## 2017-02-09 ENCOUNTER — Ambulatory Visit: Payer: Medicare Other | Attending: Internal Medicine | Admitting: Internal Medicine

## 2017-02-09 ENCOUNTER — Telehealth: Payer: Self-pay | Admitting: Internal Medicine

## 2017-02-09 VITALS — BP 161/101 | HR 69 | Temp 98.1°F | Resp 16 | Ht 63.0 in | Wt 175.6 lb

## 2017-02-09 DIAGNOSIS — M1990 Unspecified osteoarthritis, unspecified site: Secondary | ICD-10-CM | POA: Diagnosis not present

## 2017-02-09 DIAGNOSIS — Z7982 Long term (current) use of aspirin: Secondary | ICD-10-CM | POA: Diagnosis not present

## 2017-02-09 DIAGNOSIS — F17200 Nicotine dependence, unspecified, uncomplicated: Secondary | ICD-10-CM | POA: Diagnosis not present

## 2017-02-09 DIAGNOSIS — Z716 Tobacco abuse counseling: Secondary | ICD-10-CM | POA: Insufficient documentation

## 2017-02-09 DIAGNOSIS — J4520 Mild intermittent asthma, uncomplicated: Secondary | ICD-10-CM

## 2017-02-09 DIAGNOSIS — I10 Essential (primary) hypertension: Secondary | ICD-10-CM

## 2017-02-09 DIAGNOSIS — M25562 Pain in left knee: Secondary | ICD-10-CM | POA: Insufficient documentation

## 2017-02-09 DIAGNOSIS — G8929 Other chronic pain: Secondary | ICD-10-CM | POA: Diagnosis not present

## 2017-02-09 DIAGNOSIS — M79674 Pain in right toe(s): Secondary | ICD-10-CM | POA: Diagnosis not present

## 2017-02-09 DIAGNOSIS — G4700 Insomnia, unspecified: Secondary | ICD-10-CM | POA: Diagnosis not present

## 2017-02-09 DIAGNOSIS — Z8673 Personal history of transient ischemic attack (TIA), and cerebral infarction without residual deficits: Secondary | ICD-10-CM

## 2017-02-09 DIAGNOSIS — F419 Anxiety disorder, unspecified: Secondary | ICD-10-CM | POA: Diagnosis not present

## 2017-02-09 DIAGNOSIS — F17210 Nicotine dependence, cigarettes, uncomplicated: Secondary | ICD-10-CM | POA: Diagnosis not present

## 2017-02-09 DIAGNOSIS — F329 Major depressive disorder, single episode, unspecified: Secondary | ICD-10-CM | POA: Diagnosis not present

## 2017-02-09 MED ORDER — ALBUTEROL SULFATE HFA 108 (90 BASE) MCG/ACT IN AERS: 2.0000 | INHALATION_SPRAY | Freq: Four times a day (QID) | RESPIRATORY_TRACT | 5 refills | Status: AC | PRN

## 2017-02-09 MED ORDER — IBUPROFEN 600 MG PO TABS: 600.0000 mg | ORAL_TABLET | Freq: Four times a day (QID) | ORAL | 0 refills | Status: DC | PRN

## 2017-02-09 MED ORDER — IBUPROFEN 600 MG PO TABS: 600.0000 mg | ORAL_TABLET | Freq: Three times a day (TID) | ORAL | 1 refills | Status: DC | PRN

## 2017-02-09 MED ORDER — BUSPIRONE HCL 5 MG PO TABS: 5.0000 mg | ORAL_TABLET | Freq: Two times a day (BID) | ORAL | 5 refills | Status: DC

## 2017-02-09 MED ORDER — ASPIRIN 81 MG PO TABS: 81.0000 mg | ORAL_TABLET | Freq: Every day | ORAL | 11 refills | Status: DC

## 2017-02-09 MED ORDER — AMLODIPINE BESYLATE 5 MG PO TABS: 2.5000 mg | ORAL_TABLET | Freq: Every day | ORAL | 3 refills | Status: DC

## 2017-02-09 MED ORDER — HYDROCHLOROTHIAZIDE 25 MG PO TABS: 25.0000 mg | ORAL_TABLET | Freq: Every day | ORAL | 5 refills | Status: DC

## 2017-02-09 MED ORDER — NICOTINE 14 MG/24HR TD PT24: 14.0000 mg | MEDICATED_PATCH | Freq: Every day | TRANSDERMAL | 1 refills | Status: DC

## 2017-02-09 MED FILL — ?AMLODIPINE BESYLATE 5 MG T: 5 MG | 30 days supply | Qty: 15 | Fill #0

## 2017-02-09 MED FILL — HYDROCHLOROTHIAZIDE 25 MG T: 25 | 30 days supply | Qty: 30 | Fill #0

## 2017-02-09 MED FILL — IBUPROFEN 600 MG TABLET: 600 | 10 days supply | Qty: 30 | Fill #0

## 2017-02-09 MED FILL — !VENTOLIN HFA INHALER: 108 (90 BAS | 25 days supply | Qty: 18 | Fill #0

## 2017-02-09 NOTE — Progress Notes (Signed)
Pt states she is on Ambien but doesn't know the mg  Pt states the last time she took her bp medicine was 2 months ago

## 2017-02-09 NOTE — Progress Notes (Signed)
Patient ID: Brittany Jimenez, female    DOB: Jan 14, 1966  MRN: [id number]  CC: Insomnia; Back Pain; Hypertension; Re-establish; and Medication Refill   Subjective: Brittany Jimenez is a 52 y.o. female who presents for chronic ds management.  Last saw Dr. Adrian Blackwater 05/2016 Her concerns today include:  Pt with hx of tob dep, insomnia, HTN, RT great toe pain, anxiety  1.  HTN: ran out of HCTZ x 2 mths.  Ran out and took a while to get reschedule -she has not check BP in a while -no CP/SOB/LE edema Some blurred vision last wk. Last eye exam was over 2 yrs ago  2. Tob dep: smoking 3 cig/day. Wants to quit. Quit cold [place] for 3 wks about 1 yr ago. Never tried NRT, Chantix   3.  Insomnia: Requesting RF on Ambien.  States she was tried with trazodone but did not work even at 100 mg.  Last had it in 08/2016.  usually in bed at 10 p.m but may not fall asleep until 3 a.m. She set TV timer to go off at 1 a.m.  She is still up so she resets her timer again.  Ambien worked well but out.  She drinks regular Coco, Pepsi,and  fruit punh at nights. Can drink regular pepsi or coca until 1 a.m  4.  Anxiety:  Takes Buspar PRN Patient Active Problem List   Diagnosis Date Noted  . Hot flashes due to menopause 05/13/2016  . Abnormal screening mammogram 07/23/2015  . Pain of right great toe 06/25/2015  . Chronic ischemic multifocal multiple vascular territories stroke 03/05/2015  . Remote history of stroke 02/19/2015  . Hypertrophy of nasal turbinates 02/19/2015  . Left homonymous hemianopsia 02/08/2015  . Paresthesia of right foot 02/08/2015  . Numbness of foot 12/06/2014  . History of gout 11/01/2013  . Chronic pain of left knee 09/13/2013  . Anxiety 08/09/2013  . Arthralgia of both knees 08/09/2013  . Insomnia 08/09/2013  . MDD (major depressive disorder) ([place]) 07/22/2013  . Smoking 03/15/2013  . DUB (dysfunctional uterine bleeding) 06/29/2012  . Pain in joint, ankle and foot 06/21/2012  .  Pain in left knee 04/28/2012  . Arthritis 01/09/2012  . Asthma, mild intermittent 01/09/2012  . Hypertension, essential, benign 01/09/2012  . Chronic pain      No current outpatient medications on file prior to visit.   No current facility-administered medications on file prior to visit.     No Known Allergies  Social History   Socioeconomic History  . Marital status: Married    Spouse name: Not on file  . Number of children: Not on file  . Years of education: Not on file  . Highest education level: Not on file  Social Needs  . Financial resource strain: Not on file  . Food insecurity - worry: Not on file  . Food insecurity - inability: Not on file  . Transportation needs - medical: Not on file  . Transportation needs - non-medical: Not on file  Occupational History  . Not on file  Tobacco Use  . Smoking status: Former Smoker    Packs/day: 0.00    Years: 35.00    Pack years: 0.00    Types: Cigarettes    Last attempt to quit: 06/12/2016    Years since quitting: 0.6  . Smokeless tobacco: Never Used  . Tobacco comment: 1 month  Substance and Sexual Activity  . Alcohol use: No    Alcohol/week: 0.0 oz  . Drug  use: No  . Sexual activity: Yes    Birth control/protection: Surgical  Other Topics Concern  . Not on file  Social History Narrative   Lives in [place] with husband, nephew, and one daughter.  Applying for disability currently for depression and knee pain.     Used to work as a Educational psychologist at [employer].    Family History  Problem Relation Age of Onset  . Heart disease Mother   . Early death Mother   . Asthma Mother   . Diabetes Mother   . Hypertension Mother   . Cancer Father   . Alcohol abuse Father   . Early death Father   . Hypertension Father   . Asthma Sister   . Cancer Sister   . Hypertension Sister   . Depression Brother   . Drug abuse Brother   . Hypertension Brother     Past Surgical History:  Procedure Laterality Date  .  APPENDECTOMY  2001  . BUNIONECTOMY  12/10/2015   right foot  . KNEE ARTHROSCOPY WITH MEDIAL MENISECTOMY Left 01/31/2015   Procedure: LEFT KNEE ARTHROSCOPY WITH PARTIAL MEDIAL MENISCECTOMY;  Surgeon: Leandrew Koyanagi, MD;  Location: [facility];  Service: Orthopedics;  Laterality: Left;  . SYNOVECTOMY Left 01/31/2015   Procedure: SYNOVECTOMY;  Surgeon: Leandrew Koyanagi, MD;  Location: [facility];  Service: Orthopedics;  Laterality: Left;  . TUBAL LIGATION  1995    ROS: Review of Systems Neg except as above PHYSICAL EXAM: BP (!) 161/101   Pulse 69   Temp 98.1 F (36.7 C) (Oral)   Resp 16   Ht 5\' 3"  (1.6 m)   Wt 175 lb 9.6 oz (79.7 kg)   LMP 01/29/2013   SpO2 98%   BMI 31.11 kg/m   Wt Readings from Last 3 Encounters:  02/09/17 175 lb 9.6 oz (79.7 kg)  08/25/16 172 lb 6.4 oz (78.2 kg)  08/12/16 175 lb (79.4 kg)    Physical Exam  General appearance - alert, well appearing, middle age AAF and in no distress Mental status - alert, oriented to person, place, and time, normal mood, behavior, speech, dress, motor activity, and thought processes Neck - supple, no significant adenopathy Chest - clear to auscultation, no wheezes, rales or rhonchi, symmetric air entry Heart - normal rate, regular rhythm, normal S1, S2, no murmurs, rubs, clicks or gallops Extremities - peripheral pulses normal, no pedal edema, no clubbing or cyanosis   Lab Results  Component Value Date   WBC 9.1 07/07/2016   HGB 13.8 07/07/2016   HCT 42.5 07/07/2016   MCV 83 07/07/2016   PLT 268 07/07/2016     Chemistry      Component Value Date/Time   NA 141 07/07/2016 1056   K 4.0 07/07/2016 1056   CL 101 07/07/2016 1056   CO2 26 07/07/2016 1056   BUN 14 07/07/2016 1056   CREATININE 0.91 07/07/2016 1056   CREATININE 0.72 12/06/2014 1018      Component Value Date/Time   CALCIUM 10.1 07/07/2016 1056   ALKPHOS 101 07/07/2016 1056   AST 27 07/07/2016 1056   ALT 30 07/07/2016 1056    BILITOT <0.2 07/07/2016 1056      ASSESSMENT AND PLAN: 1. Essential hypertension Not at goal.  Advised patient to avoid running out of medication. Refill HCTZ.-Add low-dose amlodipine. - hydrochlorothiazide (HYDRODIURIL) 25 MG tablet; Take 1 tablet (25 mg total) by mouth daily.  Dispense: 30 tablet; Refill: 5 - Lipid panel -  Comprehensive metabolic panel - amLODipine (NORVASC) 5 MG tablet; Take 0.5 tablets (2.5 mg total) by mouth daily.  Dispense: 30 tablet; Refill: 3  2. Tobacco dependence Patient advised to quit smoking. Discussed health risks associated with smoking including lung and other types of cancers, chronic lung diseases and CV risks.. Pt ready to give trail of quitting.  Discussed methods to help quit including quitting cold [place], use of NRT, Chantix and Bupropion.  She would like to try nicotine replacement therapy.  Advised her to call [phone] quit now to request free patches and gum. Less than 3 minutes spent on counseling  3. Anxiety - busPIRone (BUSPAR) 5 MG tablet; Take 1 tablet (5 mg total) by mouth 2 (two) times daily.  Dispense: 60 tablet; Refill: 5  4. Mild intermittent asthma without complication - albuterol (PROVENTIL HFA;VENTOLIN HFA) 108 (90 Base) MCG/ACT inhaler; Inhale 2 puffs into the lungs every 6 (six) hours as needed. For shortness of breath  Dispense: 1 Inhaler; Refill: 5  5. Remote history of stroke - aspirin 81 MG tablet; Take 1 tablet (81 mg total) by mouth daily.  Dispense: 30 tablet; Refill: 11  6. Insomnia, unspecified type Discussed good sleep hygiene.  I recommend that she discontinue drinking caffeinated beverages at night.  If she wants to continue drinking Pepsi and Coke I recommend the last drink should be at 5 PM.  Also recommend turning off all lights and sounds when she gets in bed and if unable to fall asleep after 45 minutes she should get up and do something until she feels sleepy  Patient was given the opportunity to ask questions.   Patient verbalized understanding of the plan and was able to repeat key elements of the plan.   Orders Placed This Encounter  Procedures  . Lipid panel  . Comprehensive metabolic panel     Requested Prescriptions   Signed Prescriptions Disp Refills  . busPIRone (BUSPAR) 5 MG tablet 60 tablet 5    Sig: Take 1 tablet (5 mg total) by mouth 2 (two) times daily.  . hydrochlorothiazide (HYDRODIURIL) 25 MG tablet 30 tablet 5    Sig: Take 1 tablet (25 mg total) by mouth daily.  Marland Kitchen albuterol (PROVENTIL HFA;VENTOLIN HFA) 108 (90 Base) MCG/ACT inhaler 1 Inhaler 5    Sig: Inhale 2 puffs into the lungs every 6 (six) hours as needed. For shortness of breath  . aspirin 81 MG tablet 30 tablet 11    Sig: Take 1 tablet (81 mg total) by mouth daily.  Marland Kitchen amLODipine (NORVASC) 5 MG tablet 30 tablet 3    Sig: Take 0.5 tablets (2.5 mg total) by mouth daily.  Marland Kitchen ibuprofen (ADVIL,MOTRIN) 600 MG tablet 30 tablet 1    Sig: Take 1 tablet (600 mg total) by mouth every 8 (eight) hours as needed.    Return in about 4 months (around 06/09/2017).  Karle Plumber, MD, FACP

## 2017-02-09 NOTE — Patient Instructions (Addendum)
Please give patient a visit with clinical pharmacist in 1 month for BP recheck.   Restart HCTZ.   We added Norvasc for better blood pressure control.   Stop drinking caffeinated sodas at bedtime.  Try turning off all lights and sounds once you get in bed.

## 2017-02-09 NOTE — Telephone Encounter (Signed)
Patient called and said that she didn't get her nicotine patches. Please call the patient back.

## 2017-02-10 ENCOUNTER — Other Ambulatory Visit: Payer: Self-pay | Admitting: Internal Medicine

## 2017-02-10 LAB — COMPREHENSIVE METABOLIC PANEL
ALK PHOS: 98 IU/L (ref 39–117)
ALT: 22 IU/L (ref 0–32)
AST: 24 IU/L (ref 0–40)
Albumin/Globulin Ratio: 1.6 (ref 1.2–2.2)
Albumin: 4.2 g/dL (ref 3.5–5.5)
BILIRUBIN TOTAL: 0.3 mg/dL (ref 0.0–1.2)
BUN/Creatinine Ratio: 13 (ref 9–23)
BUN: 11 mg/dL (ref 6–24)
CHLORIDE: 102 mmol/L (ref 96–106)
CO2: 24 mmol/L (ref 20–29)
Calcium: 9.9 mg/dL (ref 8.7–10.2)
Creatinine, Ser: 0.82 mg/dL (ref 0.57–1.00)
GFR calc Af Amer: 94 mL/min/{1.73_m2} (ref >59)
GFR calc non Af Amer: 82 mL/min/{1.73_m2} (ref >59)
GLUCOSE: 84 mg/dL (ref 65–99)
Globulin, Total: 2.7 g/dL (ref 1.5–4.5)
Potassium: 3.9 mmol/L (ref 3.5–5.2)
Sodium: 141 mmol/L (ref 134–144)
Total Protein: 6.9 g/dL (ref 6.0–8.5)

## 2017-02-10 LAB — LIPID PANEL
Chol/HDL Ratio: 3.2 ratio (ref 0.0–4.4)
Cholesterol, Total: 193 mg/dL (ref 100–199)
HDL: 60 mg/dL (ref >39)
LDL Calculated: 114 mg/dL — ABNORMAL HIGH (ref 0–99)
Triglycerides: 93 mg/dL (ref 0–149)
VLDL CHOLESTEROL CAL: 19 mg/dL (ref 5–40)

## 2017-02-10 MED ORDER — ATORVASTATIN CALCIUM 10 MG PO TABS: 10.0000 mg | ORAL_TABLET | Freq: Every day | ORAL | 3 refills | Status: DC

## 2017-02-11 ENCOUNTER — Telehealth: Payer: Self-pay

## 2017-02-11 MED FILL — ?ATORVASTATIN 10 MG TABLET: 10 | 30 days supply | Qty: 30 | Fill #0

## 2017-02-11 NOTE — Telephone Encounter (Signed)
Contacted pt to go over lab results pt didn't answer lvm asking pt to give me a call at her earliest convenience   If pt calls back please give results: kidney and Liver Function are normal. Bad cholesterol  is 114 with normal being less than 100. This increases risk for heart attack and stroke. I recommend starting med called Lipitor to help Lower cholesterol. Rxn sent to [facility] pharmacy.

## 2017-02-11 NOTE — Telephone Encounter (Signed)
Pt states she will try and contact the 1-800 number again and she hopes she speak with someone. Pt states if she not able to get the patches she will quit cold [place]

## 2017-02-19 IMAGING — MG 2D DIGITAL DIAGNOSTIC UNILATERAL RIGHT MAMMOGRAM WITH CAD AND AD
6 series · 6 of 14 positions shown · non-contrast
Comparison: July 23, 2015 and August 01, 2015

CLINICAL DATA: Asymmetry right breast identified baseline screening
mammogram July 23, 2015. Diagnostic evaluation, this was felt to be
due to normal fibroglandular tissue. No suspicious findings are seen
on ultrasound. A six-month follow-up was requested and is performed
today.

EXAM:
2D DIGITAL DIAGNOSTIC UNILATERAL RIGHT MAMMOGRAM WITH CAD AND
ADJUNCT TOMO

[R CC synth-2D]
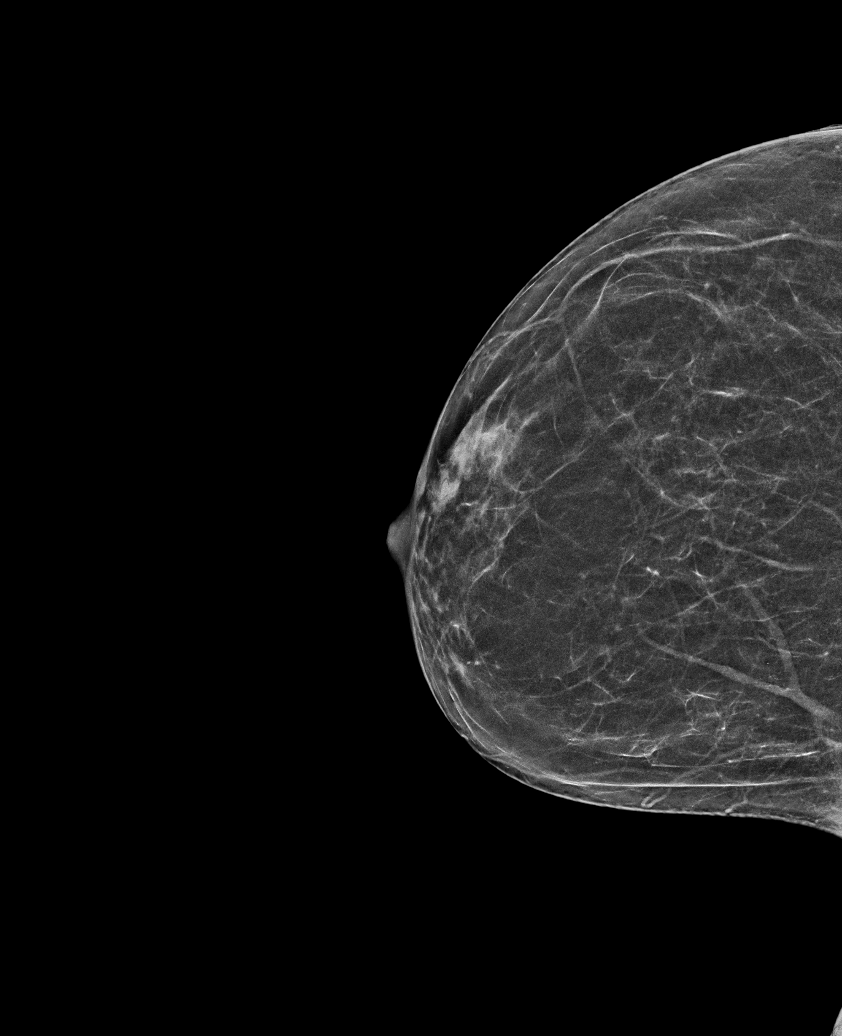

[R MLO]
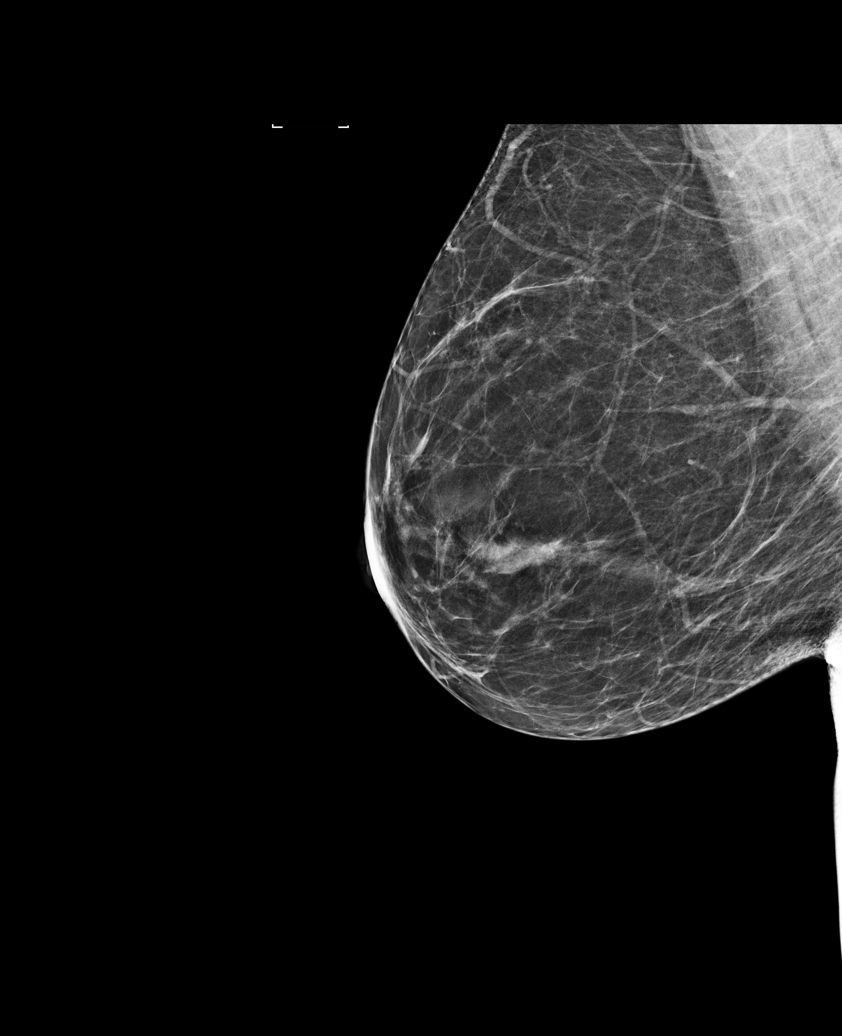

[R CC]
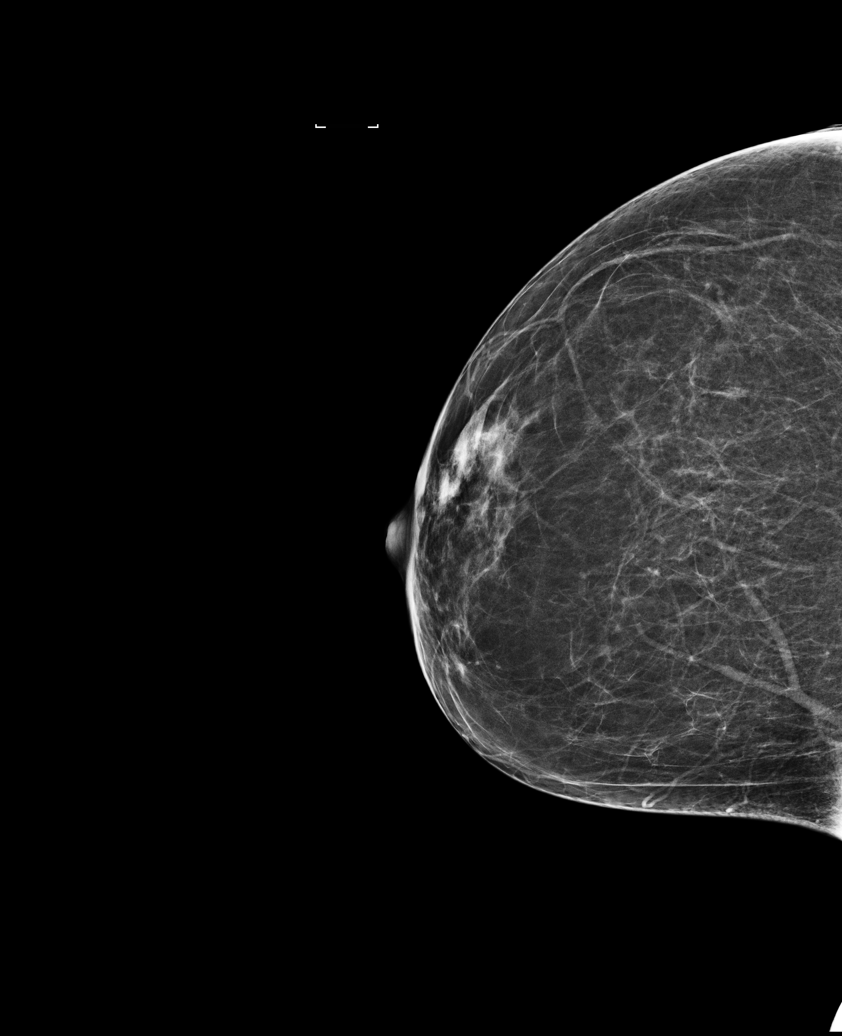

[R MLO synth-2D]
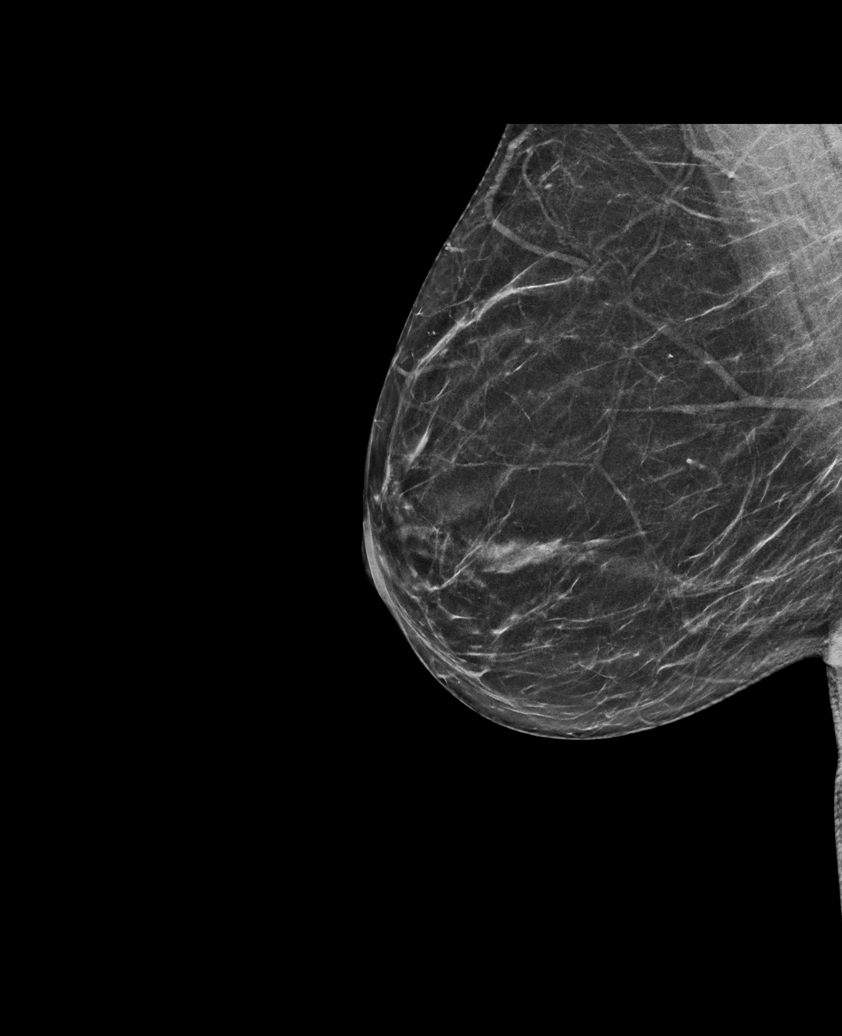

[R CC tomo · tomo slice 27/54.0]
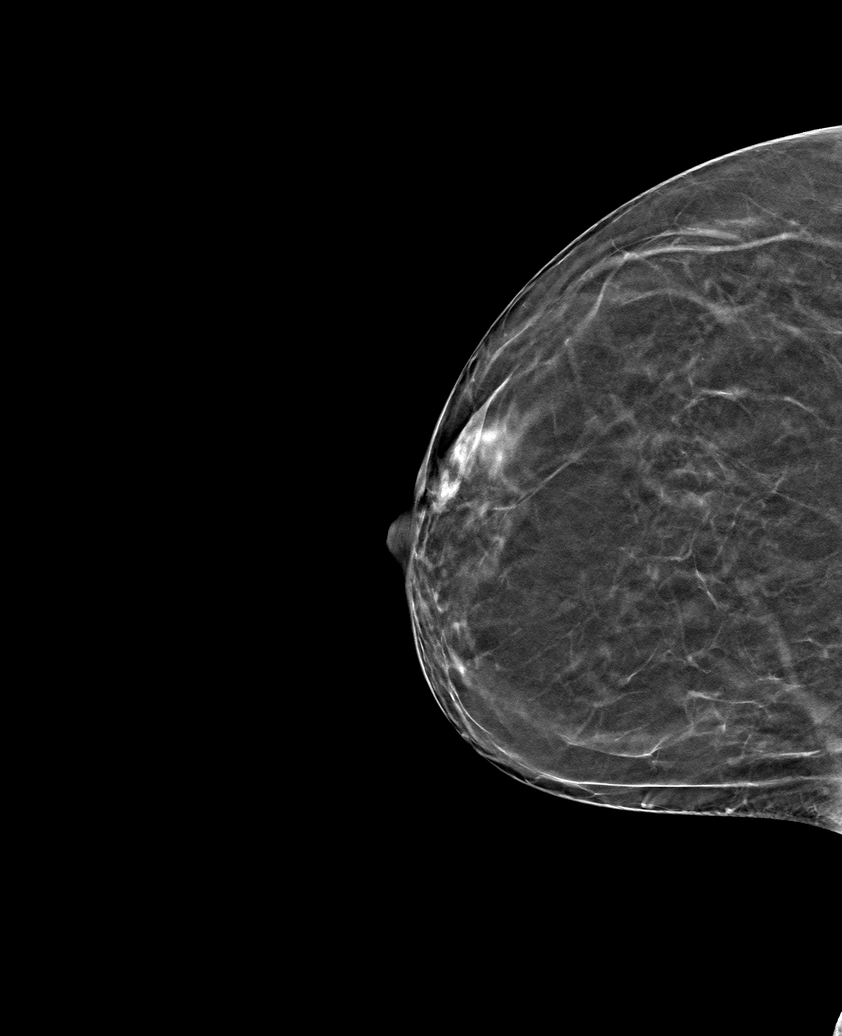

[R MLO tomo · tomo slice 30/59.0]
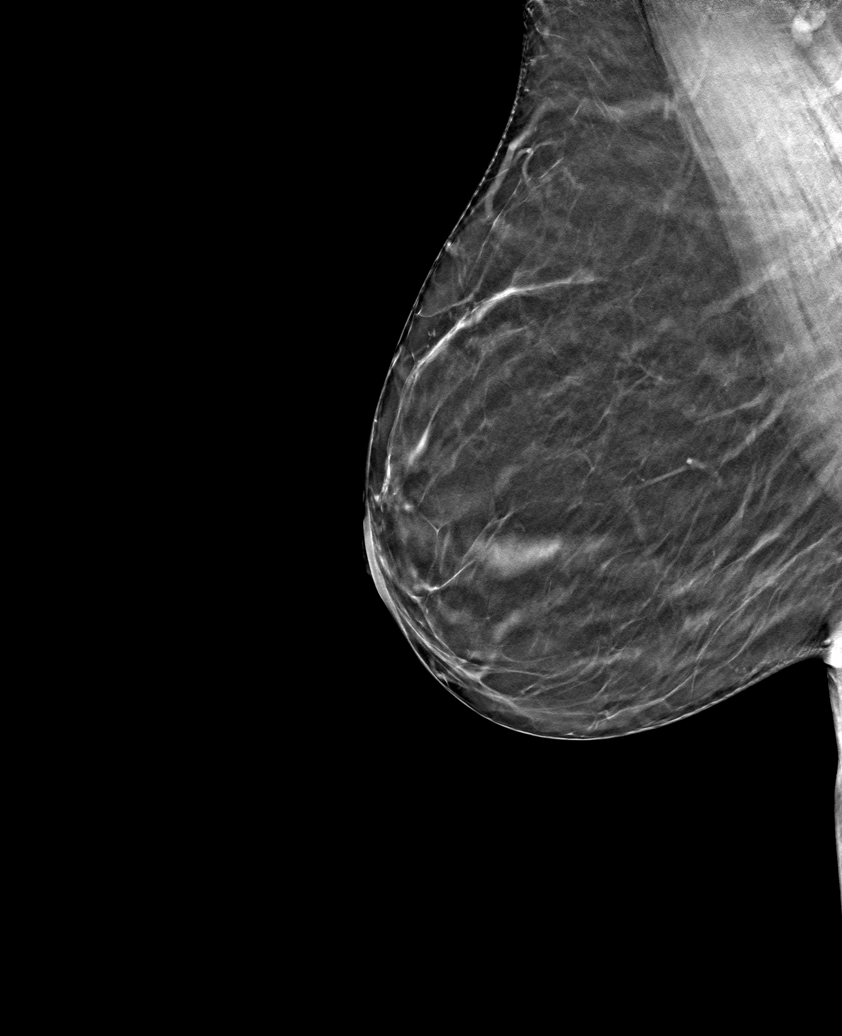

[6 of 14 positions shown; findings below may reference images not displayed]

ACR Breast Density Category b: There are scattered areas of
fibroglandular density.
FINDINGS: The retroareolar parenchymal pattern of the right breast is stable.
No mass, distortion, or suspicious microcalcifications identified in
the right breast to suggest malignancy.

Mammographic images were processed with CAD.
IMPRESSION: Stable appearance of retroareolar breast parenchyma the right. No
findings to suggest malignancy.

RECOMMENDATION:
Bilateral diagnostic mammogram is recommended in July 2016 to
complete a 1 year follow-up.

I have discussed the findings and recommendations with the patient.
Results were also provided in writing at the conclusion of the
visit. If applicable, a reminder letter will be sent to the patient
regarding the next appointment.

BI-RADS CATEGORY  3: Probably benign.

## 2017-03-12 ENCOUNTER — Encounter: Payer: Self-pay | Admitting: Pharmacist

## 2017-03-12 ENCOUNTER — Ambulatory Visit: Payer: Medicare Other | Attending: Internal Medicine | Admitting: Pharmacist

## 2017-03-12 VITALS — BP 131/86 | HR 66

## 2017-03-12 DIAGNOSIS — I10 Essential (primary) hypertension: Secondary | ICD-10-CM | POA: Insufficient documentation

## 2017-03-12 MED FILL — ATORVASTATIN 10 MG TABLET: 10 | 30 days supply | Qty: 30 | Fill #1

## 2017-03-12 NOTE — Patient Instructions (Addendum)
Thank you for coming to see [place]!  Blood pressure is good!  Follow up with Dr. Wynetta Emery as directed

## 2017-03-12 NOTE — Progress Notes (Signed)
   S:    Patient arrives in good spirits. Presents to the clinic for hypertension evaluation.   Patient reports adherence with medications. However, she has not started the atorvastatin yet.   Current BP Medications include:  Amlodipine 5 mg daily, and HCTZ 25 mg daily    O:   Last 3 Office BP readings: BP Readings from Last 3 Encounters:  03/12/17 131/86  02/09/17 (!) 161/101  08/25/16 (!) 169/84    BMET    Component Value Date/Time   NA 141 02/09/2017 1126   K 3.9 02/09/2017 1126   CL 102 02/09/2017 1126   CO2 24 02/09/2017 1126   GLUCOSE 84 02/09/2017 1126   GLUCOSE 82 12/06/2014 1018   BUN 11 02/09/2017 1126   CREATININE 0.82 02/09/2017 1126   CREATININE 0.72 12/06/2014 1018   CALCIUM 9.9 02/09/2017 1126   GFRNONAA 82 02/09/2017 1126   GFRNONAA >89 12/06/2014 1018   GFRAA 94 02/09/2017 1126   GFRAA >89 12/06/2014 1018    A/P: Hypertension longstanding currently controlled on current medications.  Continued current medications. Patient to go pick up the atorvastatin today.    Results reviewed and written information provided.   Total time in face-to-face counseling 5 minutes.   F/U Clinic Visit with Dr. Wynetta Emery as directed.  Patient seen with Reino Bellis, PharmD Candidate

## 2017-03-25 MED FILL — AMLODIPINE BESYLATE 5 MG TA: 5 | 30 days supply | Qty: 15 | Fill #1

## 2017-03-25 MED FILL — HYDROCHLOROTHIAZIDE 25 MG T: 25 | 30 days supply | Qty: 30 | Fill #1

## 2017-03-27 MED FILL — TRIAMCINOLONE ACETONIDE 0.1: 0.1 | 10 days supply | Qty: 30 | Fill #0

## 2017-03-27 MED FILL — busPIRone HCL 5 MG TABS: 5 | 30 days supply | Qty: 60 | Fill #0

## 2017-05-04 ENCOUNTER — Other Ambulatory Visit: Payer: Self-pay

## 2017-05-04 MED ORDER — BUSPIRONE HCL 10 MG PO TABS: 5.0000 mg | ORAL_TABLET | Freq: Two times a day (BID) | ORAL | 2 refills | Status: DC

## 2017-05-04 MED ORDER — BUSPIRONE HCL 10 MG PO TABS: 10.0000 mg | ORAL_TABLET | Freq: Two times a day (BID) | ORAL | 2 refills | Status: DC

## 2017-05-04 MED FILL — IBUPROFEN 600 MG TABLET: 600 | 10 days supply | Qty: 30 | Fill #1

## 2017-05-04 MED FILL — ATORVASTATIN 10 MG TABLET: 10 | 30 days supply | Qty: 30 | Fill #2

## 2017-05-04 MED FILL — HYDROCHLOROTHIAZIDE 25 MG T: 25 | 30 days supply | Qty: 30 | Fill #2

## 2017-05-04 MED FILL — AMLODIPINE BESYLATE 5 MG TA: 5 | 30 days supply | Qty: 15 | Fill #2

## 2017-05-04 MED FILL — busPIRone HCL 10 MG TABS: 10 | 30 days supply | Qty: 30 | Fill #0

## 2017-08-31 MED FILL — HYDROCHLOROTHIAZIDE 25 MG T: 25 | 30 days supply | Qty: 30 | Fill #3

## 2017-08-31 MED FILL — AMLODIPINE BESYLATE 5 MG TA: 5 | 30 days supply | Qty: 15 | Fill #3

## 2017-08-31 MED FILL — ATORVASTATIN 10 MG TABLET: 10 | 30 days supply | Qty: 30 | Fill #3

## 2017-08-31 MED FILL — busPIRone HCL 10 MG TABS: 10 | 30 days supply | Qty: 30 | Fill #1

## 2017-09-10 IMAGING — CR DG FOOT COMPLETE 3+V*R*
3 series · 3 of 3 positions shown · non-contrast
Comparison: The radiograph 07/02/2016

CLINICAL DATA: Medial foot pain

EXAM:
RIGHT FOOT COMPLETE - 3+ VIEW

[foot ap]
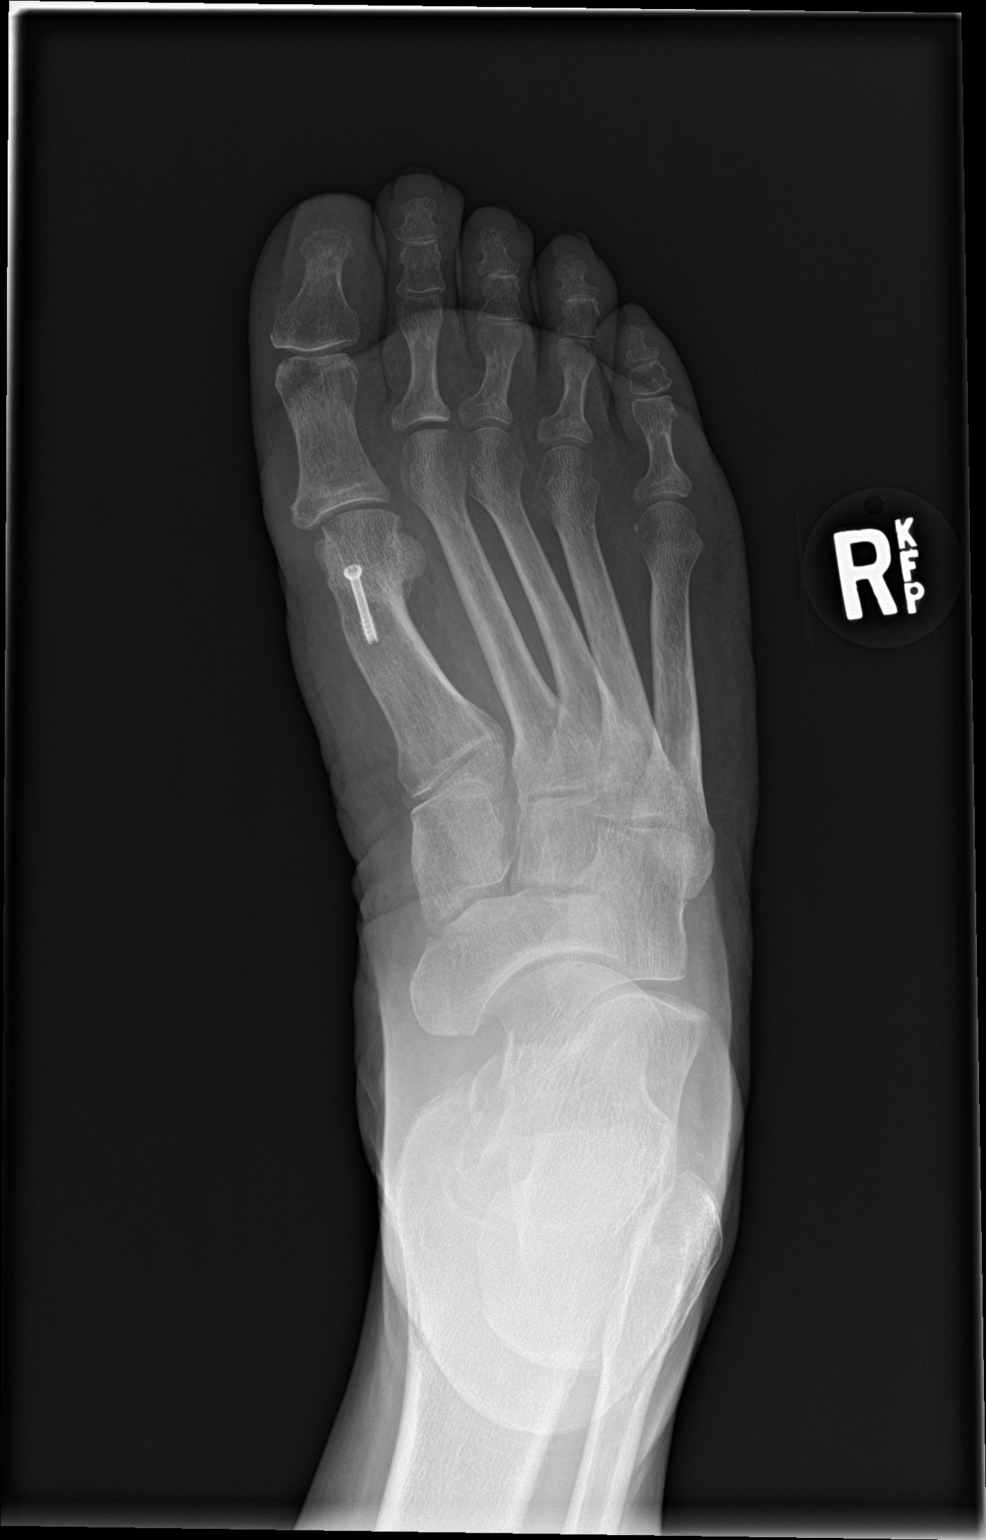

[foot obl]
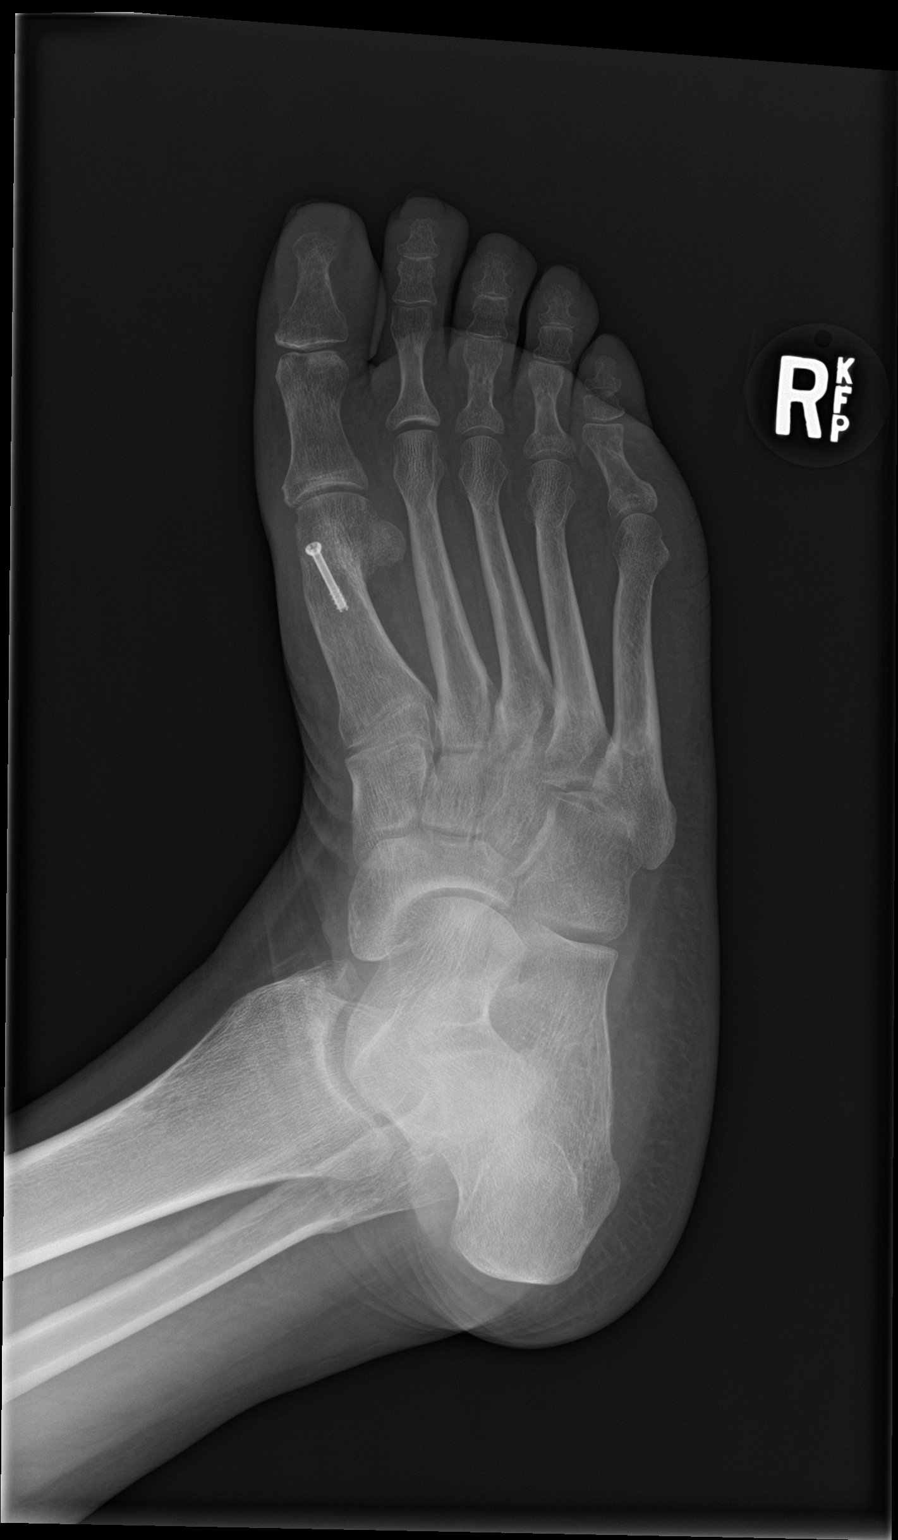

[foot lat]
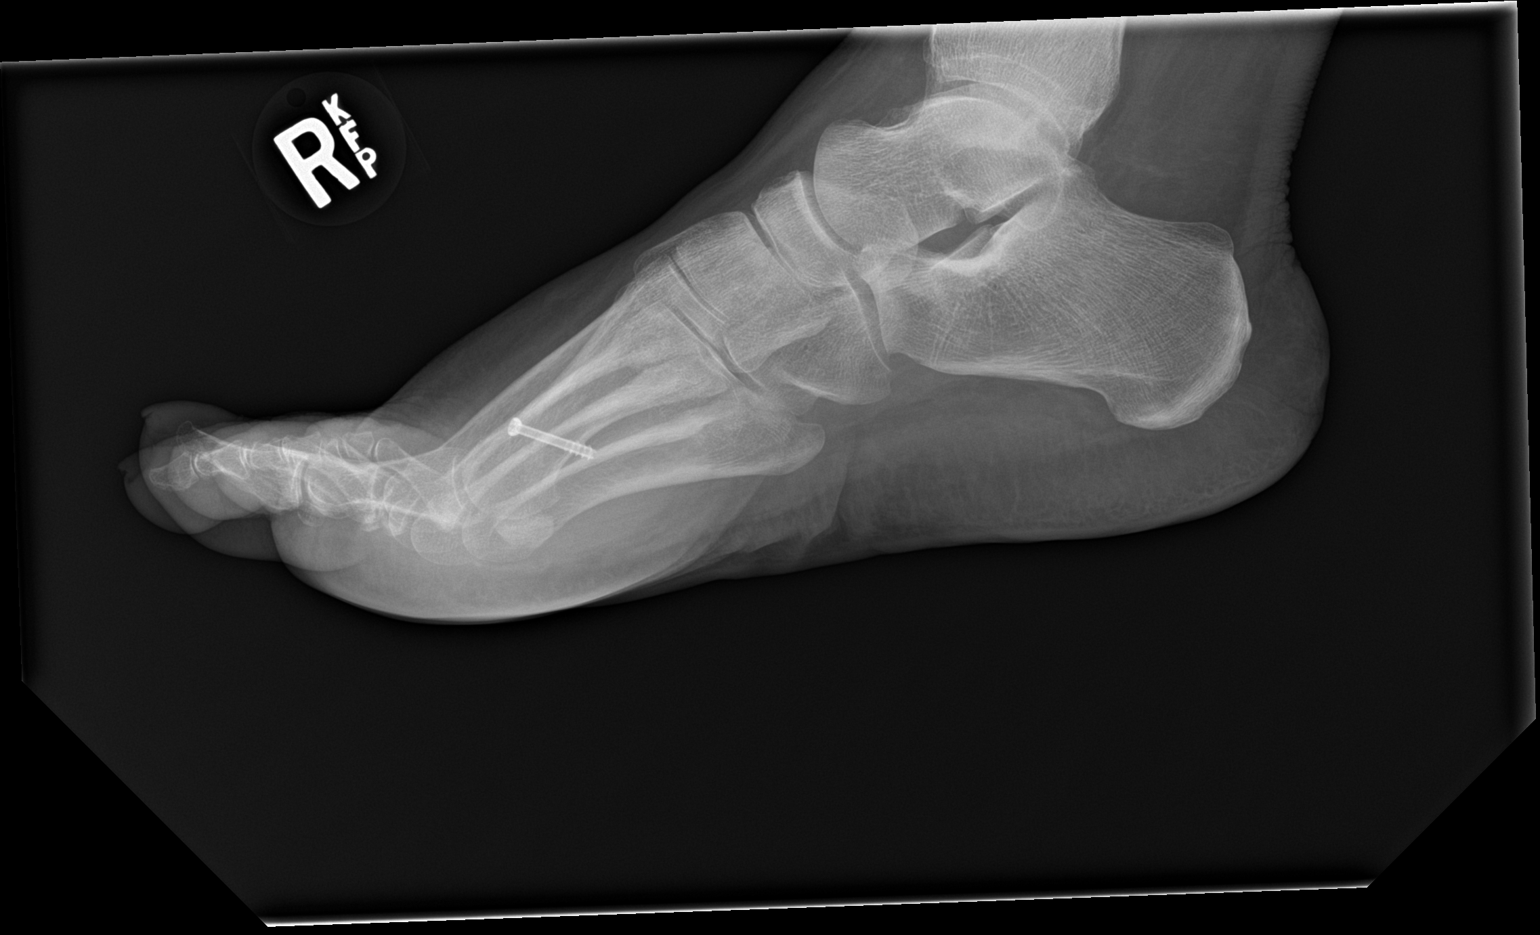

[3 of 3 positions shown; findings below may reference images not displayed]

FINDINGS: The patient is status post corrective osteotomy of the right first
metatarsal. There is a partially threaded lag screw at this
location. No fracture or dislocation. No abnormal hardware lucency.
IMPRESSION: No acute abnormality of the right foot.

## 2017-12-15 MED FILL — ATORVASTATIN 10 MG TABLET: 10 | 30 days supply | Qty: 30 | Fill #4

## 2017-12-15 MED FILL — busPIRone HCL 10 MG TABS: 10 | 30 days supply | Qty: 30 | Fill #2

## 2017-12-15 MED FILL — HYDROCHLOROTHIAZIDE 25 MG T: 25 | 30 days supply | Qty: 30 | Fill #4

## 2017-12-15 MED FILL — AMLODIPINE BESYLATE 5 MG TA: 5 | 30 days supply | Qty: 15 | Fill #4

## 2018-07-07 ENCOUNTER — Other Ambulatory Visit: Payer: Self-pay

## 2018-07-07 ENCOUNTER — Ambulatory Visit: Payer: Medicare Other | Attending: Internal Medicine | Admitting: Physician Assistant

## 2018-07-07 DIAGNOSIS — G4700 Insomnia, unspecified: Secondary | ICD-10-CM

## 2018-07-07 DIAGNOSIS — F419 Anxiety disorder, unspecified: Secondary | ICD-10-CM | POA: Diagnosis not present

## 2018-07-07 DIAGNOSIS — I10 Essential (primary) hypertension: Secondary | ICD-10-CM | POA: Diagnosis not present

## 2018-07-07 MED ORDER — HYDROCHLOROTHIAZIDE 25 MG PO TABS: 25.0000 mg | ORAL_TABLET | Freq: Every day | ORAL | 5 refills | Status: DC

## 2018-07-07 MED ORDER — BUSPIRONE HCL 10 MG PO TABS: 5.0000 mg | ORAL_TABLET | Freq: Two times a day (BID) | ORAL | 5 refills | Status: DC

## 2018-07-07 MED ORDER — BUSPIRONE HCL 5 MG PO TABS: 5.0000 mg | ORAL_TABLET | Freq: Two times a day (BID) | ORAL | 5 refills | Status: DC

## 2018-07-07 MED ORDER — TRAZODONE HCL 50 MG PO TABS: 25.0000 mg | ORAL_TABLET | Freq: Every evening | ORAL | 3 refills | Status: DC | PRN

## 2018-07-07 MED FILL — busPIRone HCL 5 MG TABS: 5 | 30 days supply | Qty: 60 | Fill #0

## 2018-07-07 MED FILL — HYDROCHLOROTHIAZIDE 25 MG T: 25 | 30 days supply | Qty: 30 | Fill #0

## 2018-07-07 MED FILL — traZODone HCL 50 MG TABS: 50 | 30 days supply | Qty: 30 | Fill #0

## 2018-07-07 NOTE — Progress Notes (Signed)
Patient ID: Brittany Jimenez, female   DOB: [birth year], 53 y.o.   MRN: [id number] Virtual Visit via Telephone Note  I connected with Henrietta Dine on 07/07/18 at  2:50 PM EDT by telephone and verified that I am speaking with the correct person using two identifiers.   I discussed the limitations, risks, security and privacy concerns of performing an evaluation and management service by telephone and the availability of in person appointments. I also discussed with the patient that there may be a patient responsible charge related to this service. The patient expressed understanding and agreed to proceed.  Patient location:  home My Location:  [place] office Persons on the call:  Me and the patient  History of Present Illness: Patient ran out of buspar about 1 week ago and feeling anxious.  Requesting Xanax.    Also having problems sleeping at times.  OTC not helping.      Observations/Objective:  A&Ox3   Assessment and Plan: 1. Essential hypertension continue - hydrochlorothiazide (HYDRODIURIL) 25 MG tablet; Take 1 tablet (25 mg total) by mouth daily.  Dispense: 30 tablet; Refill: 5  2. Anxiety Resume.  Cautioned as to why xanax is not a good/sustainable option - busPIRone (BUSPAR) 5 MG tablet; Take 1 tablet (5 mg total) by mouth 2 (two) times daily.  Dispense: 60 tablet; Refill: 5 - busPIRone (BUSPAR) 10 MG tablet; Take 0.5 tablets (5 mg total) by mouth 2 (two) times daily.  Dispense: 30 tablet; Refill: 5  3. Insomnia, unspecified type - traZODone (DESYREL) 50 MG tablet; Take 0.5-1 tablets (25-50 mg total) by mouth at bedtime as needed for sleep.  Dispense: 30 tablet; Refill: 3    Follow Up Instructions: F/up PCP in ~ 1 month   I discussed the assessment and treatment plan with the patient. The patient was provided an opportunity to ask questions and all were answered. The patient agreed with the plan and demonstrated an understanding of the instructions.   The patient  was advised to call back or seek an in-person evaluation if the symptoms worsen or if the condition fails to improve as anticipated.  I provided 7 minutes of non-face-to-face time during this encounter.   Freeman Caldron, PA-C

## 2018-07-07 NOTE — Progress Notes (Signed)
buspar   Wanted to speak about Xanax and anxiety and sleep   Takes BP at home- reading have ranged SBP- 128 ( does not have full readings)

## 2018-07-08 ENCOUNTER — Telehealth: Payer: Self-pay | Admitting: Internal Medicine

## 2018-07-08 NOTE — Telephone Encounter (Signed)
MA did not either.. prior to sending to PCP. Please advise on what patient needs to do to have this verified and documented in her chart.

## 2018-07-08 NOTE — Telephone Encounter (Signed)
Patient is scheduled for 07/19/2018 for evaluation

## 2018-07-08 NOTE — Telephone Encounter (Signed)
MA does not see this under patients problem list and MA does not see the Dx on any recent visits. Please advise on what patient needs to do to have the Dx confirmed and documented.

## 2018-07-08 NOTE — Telephone Encounter (Signed)
Brittany Jimenez from Brittany Jimenez called to confirm diagnoses for rheumatoid arthritis. In order for Brittany to apply for a grant thru their program.  Please advise [phone] Ext: [phone] Brittany Jimenez)  Thank you Brittany Jimenez

## 2018-07-19 ENCOUNTER — Ambulatory Visit: Payer: Medicare Other | Attending: Internal Medicine | Admitting: Internal Medicine

## 2018-07-19 ENCOUNTER — Other Ambulatory Visit: Payer: Self-pay

## 2018-07-19 ENCOUNTER — Encounter: Payer: Self-pay | Admitting: Internal Medicine

## 2018-07-19 DIAGNOSIS — E785 Hyperlipidemia, unspecified: Secondary | ICD-10-CM

## 2018-07-19 DIAGNOSIS — M79642 Pain in left hand: Secondary | ICD-10-CM | POA: Diagnosis not present

## 2018-07-19 DIAGNOSIS — Z8673 Personal history of transient ischemic attack (TIA), and cerebral infarction without residual deficits: Secondary | ICD-10-CM

## 2018-07-19 DIAGNOSIS — I10 Essential (primary) hypertension: Secondary | ICD-10-CM

## 2018-07-19 DIAGNOSIS — M79643 Pain in unspecified hand: Secondary | ICD-10-CM | POA: Insufficient documentation

## 2018-07-19 DIAGNOSIS — G5791 Unspecified mononeuropathy of right lower limb: Secondary | ICD-10-CM | POA: Diagnosis not present

## 2018-07-19 DIAGNOSIS — M7989 Other specified soft tissue disorders: Secondary | ICD-10-CM | POA: Diagnosis not present

## 2018-07-19 DIAGNOSIS — Z87891 Personal history of nicotine dependence: Secondary | ICD-10-CM

## 2018-07-19 MED ORDER — AMLODIPINE BESYLATE 5 MG PO TABS: 2.5000 mg | ORAL_TABLET | Freq: Every day | ORAL | 3 refills | Status: DC

## 2018-07-19 MED ORDER — ATORVASTATIN CALCIUM 10 MG PO TABS: 10.0000 mg | ORAL_TABLET | Freq: Every day | ORAL | 3 refills | Status: DC

## 2018-07-19 MED ORDER — ASPIRIN EC 81 MG PO TBEC
81.0000 mg | DELAYED_RELEASE_TABLET | Freq: Every day | ORAL | 1 refills | Status: AC
Start: 2018-07-19 — End: ?

## 2018-07-19 MED FILL — AMLODIPINE BESYLATE 5 MG TA: 5 | 30 days supply | Qty: 15 | Fill #0

## 2018-07-19 MED FILL — ATORVASTATIN 10 MG TABLET: 10 | 30 days supply | Qty: 30 | Fill #0

## 2018-07-19 NOTE — Progress Notes (Signed)
Virtual Visit via Telephone Note Due to current restrictions/limitations of in-office visits due to the COVID-19 pandemic, this scheduled clinical appointment was converted to a telehealth visit  I connected with Brittany Jimenez on 07/19/18 at 2:04 p.m by telephone and verified that I am speaking with the correct person using two identifiers. I am in my office.  The patient is at home.  Only the patient and myself participated in this encounter.  I discussed the limitations, risks, security and privacy concerns of performing an evaluation and management service by telephone and the availability of in person appointments. I also discussed with the patient that there may be a patient responsible charge related to this service. The patient expressed understanding and agreed to proceed.   History of Present Illness: Pt with hx of tob dep, insomnia, HTN, RT great toe pain, anxiety, remote history of CVA, mild intermittent asthma.  I last saw her January 2019.  Purpose of today's visit is chronic disease management and to address concerns that she is having with her joints  HYPERTENSION Currently taking: see medication list Med Adherence: []  Yes    [x]  No, taking only HCTZ.  Suppose to be on both Norvasc and HCTZ based on last visit with me Medication side effects: []  Yes    [x]  No Adherence with salt restriction: []  Yes    []  No Home Monitoring?: []  Yes    [x]  No, no device Monitoring Frequency: []  Yes    []  No Home BP results range: []  Yes    []  No SOB? [x]  Yes, occasionally    []  No Chest Pain?: []  Yes    [x]  No Leg swelling?: [x]  Yes in ankles and knee    []  No Headaches?: [x]  Yes - occasionally    []  No Dizziness? []  Yes    [x]  No Comments:   C/o intermittent feelings of pins and needles on sole of RT foot throughout the day x 1 mth.   -Goes away by rubbing the foot for about 10 mins Also noticed intermittent swelling in the entire LT hand x 1 mth.  Pain in the hand when it swells  which she describes as a tightness.  Episodes last for 2 days. Some swelling on RT hand as well but not as bad as the LT -no fever but reports sweating at nights.  Has had wgh gain about 10 lbs over the past mth -pt concern that symptoms may be due to RA Her mother (who is deceased) and 2 of her siblings have RA  Tob dep:  Stopped smoking 04/2018.   HL: Prescribed atorvastatin after her last visit with me over a year ago.  Patient states she is no longer taking.  She has on her chart a remote history of CVA.  She is not taking aspirin as was prescribed on last visit also  Outpatient Encounter Medications as of 07/19/2018  Medication Sig  . albuterol (PROVENTIL HFA;VENTOLIN HFA) 108 (90 Base) MCG/ACT inhaler Inhale 2 puffs into the lungs every 6 (six) hours as needed. For shortness of breath  . aspirin 81 MG tablet Take 1 tablet (81 mg total) by mouth daily.  . busPIRone (BUSPAR) 10 MG tablet Take 0.5 tablets (5 mg total) by mouth 2 (two) times daily.  . busPIRone (BUSPAR) 5 MG tablet Take 1 tablet (5 mg total) by mouth 2 (two) times daily.  . hydrochlorothiazide (HYDRODIURIL) 25 MG tablet Take 1 tablet (25 mg total) by mouth daily.  Marland Kitchen ibuprofen (ADVIL,MOTRIN) 600 MG  tablet Take 1 tablet (600 mg total) by mouth every 8 (eight) hours as needed.  . traZODone (DESYREL) 50 MG tablet Take 0.5-1 tablets (25-50 mg total) by mouth at bedtime as needed for sleep.  Marland Kitchen amLODipine (NORVASC) 5 MG tablet Take 0.5 tablets (2.5 mg total) by mouth daily. (Patient not taking: Reported on 07/07/2018)  . atorvastatin (LIPITOR) 10 MG tablet Take 1 tablet (10 mg total) by mouth daily. (Patient not taking: Reported on 07/07/2018)  . nicotine (NICODERM CQ - DOSED IN MG/24 HOURS) 14 mg/24hr patch Place 1 patch (14 mg total) onto the skin daily. (Patient not taking: Reported on 07/07/2018)   No facility-administered encounter medications on file as of 07/19/2018.       Observations/Objective: No direct observation done as  this was a telephone encounter  Lab Results  Component Value Date   CHOL 193 02/09/2017   HDL 60 02/09/2017   LDLCALC 114 (H) 02/09/2017   TRIG 93 02/09/2017   CHOLHDL 3.2 02/09/2017     Chemistry      Component Value Date/Time   NA 141 02/09/2017 1126   K 3.9 02/09/2017 1126   CL 102 02/09/2017 1126   CO2 24 02/09/2017 1126   BUN 11 02/09/2017 1126   CREATININE 0.82 02/09/2017 1126   CREATININE 0.72 12/06/2014 1018      Component Value Date/Time   CALCIUM 9.9 02/09/2017 1126   ALKPHOS 98 02/09/2017 1126   AST 24 02/09/2017 1126   ALT 22 02/09/2017 1126   BILITOT 0.3 02/09/2017 1126       Assessment and Plan: 1. Essential hypertension Recommend that she take both hydrochlorothiazide and amlodipine.  Refill on amlodipine sent to the pharmacy.  DASH diet discussed and encourage - amLODipine (NORVASC) 5 MG tablet; Take 0.5 tablets (2.5 mg total) by mouth daily.  Dispense: 30 tablet; Refill: 3 - CBC; Future - Comprehensive metabolic panel; Future - Lipid panel; Future  2. Former tobacco use Commended her on quitting.  Encouraged her to remain free of tobacco  3. Neuropathy of right foot Of questionable etiology.  We will bring her for an in person evaluation  4. Intermittent pain and swelling of hand We will bring her for an in person evaluation.  In the meantime we will check some baseline labs - Sedimentation rate; Future - Rheumatoid factor; Future - CYCLIC CITRUL PEPTIDE ANTIBODY, IGG/IGA; Future  5. Hyperlipidemia, unspecified hyperlipidemia type If indeed she has had a stroke in the past, I recommend taking the Lipitor as prescribed as well as the aspirin.  Patient states she will pick up the medicines from the pharmacy - atorvastatin (LIPITOR) 10 MG tablet; Take 1 tablet (10 mg total) by mouth daily.  Dispense: 30 tablet; Refill: 3 - Lipid panel; Future  6. Remote history of stroke See #5 above - aspirin EC 81 MG tablet; Take 1 tablet (81 mg total) by  mouth daily.  Dispense: 100 tablet; Refill: 1   Follow Up Instructions: F/u in 1-2 wks in person   I discussed the assessment and treatment plan with the patient. The patient was provided an opportunity to ask questions and all were answered. The patient agreed with the plan and demonstrated an understanding of the instructions.   The patient was advised to call back or seek an in-person evaluation if the symptoms worsen or if the condition fails to improve as anticipated.  I provided 20 minutes of non-face-to-face time during this encounter.   Karle Plumber, MD

## 2018-07-19 NOTE — Progress Notes (Signed)
Patient verified DOB Patient has taken medication today. Patient has eaten today Pain in the back scaled at an 8. Patients right hand swells with stiffness which was noted a few weeks ago. Left knee is swelling. Patient complains of pins and needles in the right foot.

## 2018-07-26 ENCOUNTER — Ambulatory Visit: Payer: Medicare Other | Attending: Family Medicine

## 2018-07-26 ENCOUNTER — Other Ambulatory Visit: Payer: Self-pay

## 2018-07-26 DIAGNOSIS — M79643 Pain in unspecified hand: Secondary | ICD-10-CM

## 2018-07-26 DIAGNOSIS — E785 Hyperlipidemia, unspecified: Secondary | ICD-10-CM | POA: Diagnosis not present

## 2018-07-26 DIAGNOSIS — M7989 Other specified soft tissue disorders: Secondary | ICD-10-CM

## 2018-07-26 DIAGNOSIS — I10 Essential (primary) hypertension: Secondary | ICD-10-CM

## 2018-07-28 LAB — LIPID PANEL
Chol/HDL Ratio: 3.3 ratio (ref 0.0–4.4)
Cholesterol, Total: 202 mg/dL — ABNORMAL HIGH (ref 100–199)
HDL: 62 mg/dL (ref >39)
LDL Calculated: 121 mg/dL — ABNORMAL HIGH (ref 0–99)
Triglycerides: 96 mg/dL (ref 0–149)
VLDL Cholesterol Cal: 19 mg/dL (ref 5–40)

## 2018-07-28 LAB — COMPREHENSIVE METABOLIC PANEL
ALT: 17 IU/L (ref 0–32)
AST: 13 IU/L (ref 0–40)
Albumin/Globulin Ratio: 1.7 (ref 1.2–2.2)
Albumin: 4.3 g/dL (ref 3.8–4.9)
Alkaline Phosphatase: 114 IU/L (ref 39–117)
BUN/Creatinine Ratio: 12 (ref 9–23)
BUN: 14 mg/dL (ref 6–24)
Bilirubin Total: <0.2 mg/dL (ref 0.0–1.2)
CO2: 26 mmol/L (ref 20–29)
Calcium: 9.5 mg/dL (ref 8.7–10.2)
Chloride: 102 mmol/L (ref 96–106)
Creatinine, Ser: 1.15 mg/dL — ABNORMAL HIGH (ref 0.57–1.00)
GFR calc Af Amer: 62 mL/min/{1.73_m2} (ref >59)
GFR calc non Af Amer: 54 mL/min/{1.73_m2} — ABNORMAL LOW (ref >59)
Globulin, Total: 2.6 g/dL (ref 1.5–4.5)
Glucose: 85 mg/dL (ref 65–99)
Potassium: 4.1 mmol/L (ref 3.5–5.2)
Sodium: 141 mmol/L (ref 134–144)
Total Protein: 6.9 g/dL (ref 6.0–8.5)

## 2018-07-28 LAB — CBC
Hematocrit: 42.6 % (ref 34.0–46.6)
Hemoglobin: 13.8 g/dL (ref 11.1–15.9)
MCH: 26.8 pg (ref 26.6–33.0)
MCHC: 32.4 g/dL (ref 31.5–35.7)
MCV: 83 fL (ref 79–97)
Platelets: 240 10*3/uL (ref 150–450)
RBC: 5.14 x10E6/uL (ref 3.77–5.28)
RDW: 13.2 % (ref 11.7–15.4)
WBC: 9.3 10*3/uL (ref 3.4–10.8)

## 2018-07-28 LAB — SEDIMENTATION RATE: Sed Rate: 47 mm/hr — ABNORMAL HIGH (ref 0–40)

## 2018-07-28 LAB — RHEUMATOID FACTOR: Rhuematoid fact SerPl-aCnc: <10 IU/mL (ref 0.0–13.9)

## 2018-07-28 LAB — CYCLIC CITRUL PEPTIDE ANTIBODY, IGG/IGA: Cyclic Citrullin Peptide Ab: 6 units (ref 0–19)

## 2018-07-30 ENCOUNTER — Telehealth: Payer: Self-pay

## 2018-07-30 ENCOUNTER — Ambulatory Visit: Payer: Medicare Other | Admitting: Internal Medicine

## 2018-07-30 NOTE — Telephone Encounter (Signed)
Contacted pt to go over lab results pt is aware and doesn't have any questions or concerns 

## 2018-09-15 MED FILL — busPIRone HCL 5 MG TABS: 5 | 30 days supply | Qty: 60 | Fill #1

## 2018-11-10 MED FILL — ATORVASTATIN 10 MG TABLET: 10 | 30 days supply | Qty: 30 | Fill #1

## 2018-11-10 MED FILL — HYDROCHLOROTHIAZIDE 25 MG T: 25 | 30 days supply | Qty: 30 | Fill #1

## 2018-11-10 MED FILL — busPIRone HCL 5 MG TABS: 5 | 30 days supply | Qty: 60 | Fill #2

## 2018-11-11 DIAGNOSIS — R531 Weakness: Secondary | ICD-10-CM | POA: Diagnosis not present

## 2018-11-11 DIAGNOSIS — R112 Nausea with vomiting, unspecified: Secondary | ICD-10-CM | POA: Diagnosis not present

## 2018-11-11 DIAGNOSIS — R001 Bradycardia, unspecified: Secondary | ICD-10-CM | POA: Diagnosis not present

## 2018-11-11 DIAGNOSIS — R61 Generalized hyperhidrosis: Secondary | ICD-10-CM | POA: Diagnosis not present

## 2018-11-11 DIAGNOSIS — I10 Essential (primary) hypertension: Secondary | ICD-10-CM | POA: Diagnosis not present

## 2018-11-11 DIAGNOSIS — R42 Dizziness and giddiness: Secondary | ICD-10-CM | POA: Diagnosis not present

## 2018-11-22 ENCOUNTER — Other Ambulatory Visit: Payer: Self-pay

## 2018-11-22 ENCOUNTER — Ambulatory Visit: Payer: Medicare Other | Attending: Family Medicine | Admitting: Family Medicine

## 2018-11-22 DIAGNOSIS — R42 Dizziness and giddiness: Secondary | ICD-10-CM

## 2018-11-22 MED ORDER — MECLIZINE HCL 25 MG PO TABS: 25.0000 mg | ORAL_TABLET | Freq: Three times a day (TID) | ORAL | 1 refills | Status: DC | PRN

## 2018-11-22 MED FILL — MECLIZINE 25 MG TABLET: 25 | 20 days supply | Qty: 60 | Fill #0

## 2018-11-22 NOTE — Progress Notes (Signed)
Patient has been called and DOB has been verified. Patient has been screened and transferred to PCP to start phone visit.    Patient was seen in ED for vertigo and was informed that PCP will prescribe medication.

## 2018-11-22 NOTE — Progress Notes (Signed)
Virtual Visit via Telephone Note  I connected with Brittany Jimenez, on 11/22/2018 at 3:27 PM by telephone due to the COVID-19 pandemic and verified that I am speaking with the correct person using two identifiers.   Consent: I discussed the limitations, risks, security and privacy concerns of performing an evaluation and management service by telephone and the availability of in person appointments. I also discussed with the patient that there may be a patient responsible charge related to this service. The patient expressed understanding and agreed to proceed.   Location of Patient: Home  Location of Provider: Clinic   Persons participating in Telemedicine visit: Elaney Wright Farrington-CMA Dr. Margarita Rana     History of Present Illness: 53 year old female with a history of Asthma, Hypertension' Depression who is seen for an acute visit complaining of dizziness. Seen at [facility] ED for vertigo on 11/11/18 but was not given any medication but informed her PCP would treat her. She describes a sensation of dizziness on turning of her head, change of position and the room spinning which led to her presenting to the ED. Symptoms are absent at this time. She denies presence of nausea, blurry vision headache. Past Medical History:  Diagnosis Date  . Anxiety   . Arthritis   . Asthma   . Chronic pain   . Depression   . Hypertension   . Obesity   . Stroke [facility])    Pt unsure when had them/ has hx of 2 strokes per CT scan   No Known Allergies  Current Outpatient Medications on File Prior to Visit  Medication Sig Dispense Refill  . albuterol (PROVENTIL HFA;VENTOLIN HFA) 108 (90 Base) MCG/ACT inhaler Inhale 2 puffs into the lungs every 6 (six) hours as needed. For shortness of breath 1 Inhaler 5  . amLODipine (NORVASC) 5 MG tablet Take 0.5 tablets (2.5 mg total) by mouth daily. 30 tablet 3  . aspirin EC 81 MG tablet Take 1 tablet (81 mg total) by mouth daily. 100 tablet 1   . atorvastatin (LIPITOR) 10 MG tablet Take 1 tablet (10 mg total) by mouth daily. 30 tablet 3  . busPIRone (BUSPAR) 5 MG tablet Take 1 tablet (5 mg total) by mouth 2 (two) times daily. 60 tablet 5  . hydrochlorothiazide (HYDRODIURIL) 25 MG tablet Take 1 tablet (25 mg total) by mouth daily. 30 tablet 5  . ibuprofen (ADVIL,MOTRIN) 600 MG tablet Take 1 tablet (600 mg total) by mouth every 8 (eight) hours as needed. 30 tablet 1  . busPIRone (BUSPAR) 10 MG tablet Take 0.5 tablets (5 mg total) by mouth 2 (two) times daily. (Patient not taking: Reported on 11/22/2018) 30 tablet 5  . traZODone (DESYREL) 50 MG tablet Take 0.5-1 tablets (25-50 mg total) by mouth at bedtime as needed for sleep. (Patient not taking: Reported on 11/22/2018) 30 tablet 3   No current facility-administered medications on file prior to visit.     Observations/Objective: Awake, alert, oriented x3 Not in acute distress  Assessment and Plan: 1. Vertigo Commenced on Meclizine- sedative side effects discussed  - meclizine (ANTIVERT) 25 MG tablet; Take 1 tablet (25 mg total) by mouth 3 (three) times daily as needed for dizziness.  Dispense: 60 tablet; Refill: 1   Follow Up Instructions: Return for follow up, keep previously scheduled appointment with PCP.    I discussed the assessment and treatment plan with the patient. The patient was provided an opportunity to ask questions and all were answered. The patient agreed with  the plan and demonstrated an understanding of the instructions.   The patient was advised to call back or seek an in-person evaluation if the symptoms worsen or if the condition fails to improve as anticipated.     I provided 9 minutes total of non-face-to-face time during this encounter including median intraservice time, reviewing previous notes, labs, imaging, medications, management and patient verbalized understanding.     Charlott Rakes, MD, FAAFP. [facility] and [place], [place]   11/22/2018, 3:27 PM

## 2018-11-23 ENCOUNTER — Encounter: Payer: Self-pay | Admitting: Family Medicine

## 2019-01-10 MED FILL — busPIRone HCL 5 MG TABS: 5 | 30 days supply | Qty: 60 | Fill #3

## 2019-01-10 MED FILL — MECLIZINE 25 MG TABLET: 25 | 20 days supply | Qty: 60 | Fill #1

## 2019-01-10 MED FILL — HYDROCHLOROTHIAZIDE 25 MG T: 25 | 30 days supply | Qty: 30 | Fill #2

## 2019-02-24 ENCOUNTER — Other Ambulatory Visit: Payer: Self-pay | Admitting: Family Medicine

## 2019-02-24 DIAGNOSIS — R42 Dizziness and giddiness: Secondary | ICD-10-CM

## 2019-02-24 MED FILL — busPIRone HCL 5 MG TABS: 5 | 30 days supply | Qty: 60 | Fill #4

## 2019-02-24 MED FILL — HYDROCHLOROTHIAZIDE 25 MG T: 25 | 30 days supply | Qty: 30 | Fill #3

## 2019-02-24 MED FILL — MECLIZINE 25 MG TABLET: 25 | 20 days supply | Qty: 60 | Fill #0

## 2019-03-22 MED FILL — busPIRone HCL 5 MG TABS: 5 | 30 days supply | Qty: 60 | Fill #5

## 2019-03-22 MED FILL — HYDROCHLOROTHIAZIDE 25 MG T: 25 | 30 days supply | Qty: 30 | Fill #4

## 2019-04-21 MED FILL — HYDROCHLOROTHIAZIDE 25 MG T: 25 | 30 days supply | Qty: 30 | Fill #5

## 2019-07-11 ENCOUNTER — Other Ambulatory Visit: Payer: Self-pay | Admitting: Physician Assistant

## 2019-07-11 DIAGNOSIS — I10 Essential (primary) hypertension: Secondary | ICD-10-CM

## 2019-07-12 MED FILL — HYDROCHLOROTHIAZIDE 25 MG T: 25 | 30 days supply | Qty: 30 | Fill #0

## 2019-08-10 DIAGNOSIS — E785 Hyperlipidemia, unspecified: Secondary | ICD-10-CM | POA: Diagnosis not present

## 2019-08-10 DIAGNOSIS — Z7982 Long term (current) use of aspirin: Secondary | ICD-10-CM | POA: Diagnosis not present

## 2019-08-10 DIAGNOSIS — R29810 Facial weakness: Secondary | ICD-10-CM | POA: Diagnosis not present

## 2019-08-10 DIAGNOSIS — F419 Anxiety disorder, unspecified: Secondary | ICD-10-CM | POA: Diagnosis not present

## 2019-08-10 DIAGNOSIS — R41841 Cognitive communication deficit: Secondary | ICD-10-CM | POA: Diagnosis not present

## 2019-08-10 DIAGNOSIS — I639 Cerebral infarction, unspecified: Secondary | ICD-10-CM | POA: Diagnosis not present

## 2019-08-10 DIAGNOSIS — R404 Transient alteration of awareness: Secondary | ICD-10-CM | POA: Diagnosis not present

## 2019-08-10 DIAGNOSIS — R531 Weakness: Secondary | ICD-10-CM | POA: Diagnosis not present

## 2019-08-10 DIAGNOSIS — J45909 Unspecified asthma, uncomplicated: Secondary | ICD-10-CM | POA: Diagnosis not present

## 2019-08-10 DIAGNOSIS — Z87891 Personal history of nicotine dependence: Secondary | ICD-10-CM | POA: Diagnosis not present

## 2019-08-10 DIAGNOSIS — Z79899 Other long term (current) drug therapy: Secondary | ICD-10-CM | POA: Diagnosis not present

## 2019-08-10 DIAGNOSIS — R479 Unspecified speech disturbances: Secondary | ICD-10-CM | POA: Diagnosis not present

## 2019-08-10 DIAGNOSIS — G8929 Other chronic pain: Secondary | ICD-10-CM | POA: Diagnosis not present

## 2019-08-10 DIAGNOSIS — I69398 Other sequelae of cerebral infarction: Secondary | ICD-10-CM | POA: Diagnosis not present

## 2019-08-10 DIAGNOSIS — R569 Unspecified convulsions: Secondary | ICD-10-CM | POA: Diagnosis not present

## 2019-08-10 DIAGNOSIS — R29818 Other symptoms and signs involving the nervous system: Secondary | ICD-10-CM | POA: Diagnosis not present

## 2019-08-10 DIAGNOSIS — R4701 Aphasia: Secondary | ICD-10-CM | POA: Diagnosis not present

## 2019-08-10 DIAGNOSIS — F329 Major depressive disorder, single episode, unspecified: Secondary | ICD-10-CM | POA: Diagnosis not present

## 2019-08-10 DIAGNOSIS — I10 Essential (primary) hypertension: Secondary | ICD-10-CM | POA: Diagnosis not present

## 2019-08-11 DIAGNOSIS — I6930 Unspecified sequelae of cerebral infarction: Secondary | ICD-10-CM | POA: Diagnosis not present

## 2019-08-11 DIAGNOSIS — I639 Cerebral infarction, unspecified: Secondary | ICD-10-CM | POA: Diagnosis not present

## 2019-08-11 DIAGNOSIS — R4701 Aphasia: Secondary | ICD-10-CM | POA: Diagnosis not present

## 2019-08-11 DIAGNOSIS — R531 Weakness: Secondary | ICD-10-CM | POA: Diagnosis not present

## 2019-08-12 DIAGNOSIS — R4701 Aphasia: Secondary | ICD-10-CM | POA: Diagnosis not present

## 2019-08-12 DIAGNOSIS — I69398 Other sequelae of cerebral infarction: Secondary | ICD-10-CM | POA: Diagnosis not present

## 2019-08-12 DIAGNOSIS — I639 Cerebral infarction, unspecified: Secondary | ICD-10-CM | POA: Diagnosis not present

## 2019-08-12 DIAGNOSIS — E785 Hyperlipidemia, unspecified: Secondary | ICD-10-CM | POA: Diagnosis not present

## 2019-08-12 DIAGNOSIS — I700 Atherosclerosis of aorta: Secondary | ICD-10-CM | POA: Diagnosis not present

## 2019-08-12 DIAGNOSIS — R569 Unspecified convulsions: Secondary | ICD-10-CM | POA: Diagnosis not present

## 2019-08-12 DIAGNOSIS — R531 Weakness: Secondary | ICD-10-CM | POA: Diagnosis not present

## 2019-08-12 DIAGNOSIS — I6930 Unspecified sequelae of cerebral infarction: Secondary | ICD-10-CM | POA: Diagnosis not present

## 2019-08-12 DIAGNOSIS — I080 Rheumatic disorders of both mitral and aortic valves: Secondary | ICD-10-CM | POA: Diagnosis not present

## 2019-08-12 DIAGNOSIS — H53462 Homonymous bilateral field defects, left side: Secondary | ICD-10-CM | POA: Diagnosis not present

## 2019-08-12 DIAGNOSIS — Z87891 Personal history of nicotine dependence: Secondary | ICD-10-CM | POA: Diagnosis not present

## 2019-08-12 DIAGNOSIS — R9401 Abnormal electroencephalogram [EEG]: Secondary | ICD-10-CM | POA: Diagnosis not present

## 2019-08-16 DIAGNOSIS — I69398 Other sequelae of cerebral infarction: Secondary | ICD-10-CM | POA: Diagnosis not present

## 2019-08-16 DIAGNOSIS — R569 Unspecified convulsions: Secondary | ICD-10-CM | POA: Diagnosis not present

## 2019-08-31 ENCOUNTER — Encounter: Payer: Self-pay | Admitting: Internal Medicine

## 2019-08-31 NOTE — Progress Notes (Signed)
I received discharge summary from [facility]. Patient admitted 728-08/14/19. Patient admitted with aphasia and right-sided weakness. Work-up was as follow: CT of the head no acute pathology. Multiple foci of encephalomalacia left frontal lobe, left parietal lobe, left temporal lobe and the paramedial right parietal lobe related to chronic infarcts MRI brain: Small acute infarct in the left parasagittal frontal lobe predominantly affecting the cerebral cortex and this may be an ictal focus. Chronic infarcts as indicated in the CT scan MRA of the head: Unremarkable TTE: EF 60 to 65% and no intraatrial shunt LDL 134, A1c of 5.3. Patient and family declined statin therapy Routine EEG: Potential epileptogenic discharges over the left frontal temporal region this is a signature for the patient's presentation is a simple partial seizure and in the absence of the details likely complex partial seizure. TEE: EF 65 to 70% and no interatrial shunt and no left atrial appendage thrombosis  Patient's course was complicated with a simple partial seizure. Patient discharged home on Lamictal 50 mg twice daily, Plavix 75 mg and aspirin 81 mg and meclizine 25 mg 3 times a day as needed.Marland Kitchen

## 2019-09-08 ENCOUNTER — Ambulatory Visit: Payer: Medicare Other | Attending: Internal Medicine | Admitting: Internal Medicine

## 2019-09-08 DIAGNOSIS — I10 Essential (primary) hypertension: Secondary | ICD-10-CM | POA: Diagnosis not present

## 2019-09-08 DIAGNOSIS — Z2821 Immunization not carried out because of patient refusal: Secondary | ICD-10-CM | POA: Diagnosis not present

## 2019-09-08 DIAGNOSIS — Z79899 Other long term (current) drug therapy: Secondary | ICD-10-CM | POA: Diagnosis not present

## 2019-09-08 DIAGNOSIS — J4520 Mild intermittent asthma, uncomplicated: Secondary | ICD-10-CM | POA: Diagnosis not present

## 2019-09-08 DIAGNOSIS — G40909 Epilepsy, unspecified, not intractable, without status epilepticus: Secondary | ICD-10-CM | POA: Diagnosis not present

## 2019-09-08 DIAGNOSIS — Z7982 Long term (current) use of aspirin: Secondary | ICD-10-CM | POA: Diagnosis not present

## 2019-09-08 DIAGNOSIS — F419 Anxiety disorder, unspecified: Secondary | ICD-10-CM | POA: Insufficient documentation

## 2019-09-08 DIAGNOSIS — F17200 Nicotine dependence, unspecified, uncomplicated: Secondary | ICD-10-CM

## 2019-09-08 DIAGNOSIS — F17210 Nicotine dependence, cigarettes, uncomplicated: Secondary | ICD-10-CM | POA: Insufficient documentation

## 2019-09-08 DIAGNOSIS — Z7902 Long term (current) use of antithrombotics/antiplatelets: Secondary | ICD-10-CM | POA: Insufficient documentation

## 2019-09-08 DIAGNOSIS — Z8673 Personal history of transient ischemic attack (TIA), and cerebral infarction without residual deficits: Secondary | ICD-10-CM | POA: Diagnosis not present

## 2019-09-08 MED ORDER — CLOPIDOGREL BISULFATE 75 MG PO TABS: 75.0000 mg | ORAL_TABLET | Freq: Every day | ORAL | 3 refills | Status: DC

## 2019-09-08 MED ORDER — LAMOTRIGINE 25 MG PO TABS: 50.0000 mg | ORAL_TABLET | Freq: Two times a day (BID) | ORAL | 3 refills | Status: AC

## 2019-09-08 MED ORDER — ATORVASTATIN CALCIUM 40 MG PO TABS: 40.0000 mg | ORAL_TABLET | Freq: Every day | ORAL | 3 refills | Status: DC

## 2019-09-08 NOTE — Progress Notes (Signed)
Virtual Visit via Telephone Note Due to current restrictions/limitations of in-office visits due to the COVID-19 pandemic, this scheduled clinical appointment was converted to a telehealth visit.  She was suppose to come in person but her daughter could not bring her at the appointment time so she requested change to televisit.   I connected with Brittany Jimenez on 09/08/19 at 4:02 p.m by telephone and verified that I am speaking with the correct person using two identifiers. I am in my office.  The patient is at home.  Only the patient, her daughter and myself participated in this encounter.  I discussed the limitations, risks, security and privacy concerns of performing an evaluation and management service by telephone and the availability of in person appointments. I also discussed with the patient that there may be a patient responsible charge related to this service. The patient expressed understanding and agreed to proceed.   History of Present Illness: Pt with hx of tob dep, insomnia, HTN, RT great toe pain, anxiety,history of CVA, mild intermittent asthma, vit D def. Last eval by me 07/19/2018 via televisit.  Last seen in person 02/09/2017.   I received discharge summary from [facility]. Patient admitted 728-08/14/19 with aphasia and right-sided weakness. Work-up was as follow: CT of the head no acute pathology. Multiple foci of encephalomalacia left frontal lobe, left parietal lobe, left temporal lobe and the paramedial right parietal lobe related to chronic infarcts MRI brain: Small acute infarct in the left parasagittal frontal lobe predominantly affecting the cerebral cortex and this may be an ictal focus. Chronic infarcts as indicated in the CT scan MRA of the head: Unremarkable TTE: EF 60 to 65% and no intraatrial shunt LDL 134, A1c of 5.3. Patient and family declined statin therapy Routine EEG: Potential epileptogenic discharges over the left frontal  temporal region this is a signature for the patient's presentation is a simple partial seizure and in the absence of the details likely complex partial seizure. TEE: EF 65 to 70% and no interatrial shunt and no left atrial appendage thrombosis  Patient's course was complicated with a simple partial seizure. Patient discharged home on Lamictal 50 mg twice daily, Plavix 75 mg and aspirin 81 mg and meclizine 25 mg 3 times a day as needed.  Today: CVA/SZ: She has not had any seizures since being home.  She just ran out of Lamictal. Reports that all of her symptoms from the stroke namely problems speaking and right arm weakness have resolved.  She did see the neurologist in follow-up through [facility] 08/16/2019.  She is now on Lipitor 40 mg daily, Plavix and aspirin.Marland Kitchen Previously she was on low-dose Norvasc and hydrochlorothiazide when I last saw her.  She states that she was not discharged on any blood pressure medication.  BP was 118/74 on visit with neurology earlier this month.  Told she is not getting enough sleep.  Advise to take Melatonin daily.   -She had quit in the past when I last spoke with her.  However she started smoking again.  She is now trying to quit again given the recent stroke.  Smokes 3 cigarretts/day.  He has not set a quit date -She is thinking about possibly finding a PCP in [place] where she resides as it would be closer for her rather than having to drive to [place].  HM: She has not had the COVID-19 vaccine and states she does not plan to get it.  She is due for flu vaccine. Outpatient Encounter  Medications as of 09/08/2019  Medication Sig  . albuterol (PROVENTIL HFA;VENTOLIN HFA) 108 (90 Base) MCG/ACT inhaler Inhale 2 puffs into the lungs every 6 (six) hours as needed. For shortness of breath  . amLODipine (NORVASC) 5 MG tablet Take 0.5 tablets (2.5 mg total) by mouth daily.  Marland Kitchen aspirin EC 81 MG tablet Take 1 tablet (81 mg total) by mouth daily.  Marland Kitchen atorvastatin (LIPITOR)  10 MG tablet Take 1 tablet (10 mg total) by mouth daily.  . busPIRone (BUSPAR) 10 MG tablet Take 0.5 tablets (5 mg total) by mouth 2 (two) times daily. (Patient not taking: Reported on 11/22/2018)  . busPIRone (BUSPAR) 5 MG tablet Take 1 tablet (5 mg total) by mouth 2 (two) times daily.  . hydrochlorothiazide (HYDRODIURIL) 25 MG tablet Take 1 tablet (25 mg total) by mouth daily. Must have office visit for refills!  Marland Kitchen ibuprofen (ADVIL,MOTRIN) 600 MG tablet Take 1 tablet (600 mg total) by mouth every 8 (eight) hours as needed.  . meclizine (ANTIVERT) 25 MG tablet TAKE 1 TABLET (25 MG TOTAL) BY MOUTH 3 (THREE) TIMES DAILY AS NEEDED FOR DIZZINESS.  . traZODone (DESYREL) 50 MG tablet Take 0.5-1 tablets (25-50 mg total) by mouth at bedtime as needed for sleep. (Patient not taking: Reported on 11/22/2018)   No facility-administered encounter medications on file as of 09/08/2019.       Observations/Objective: No direct observation done as this was a telephone encounter.  Assessment and Plan: 1. History of lacunar cerebrovascular accident (CVA) Discussed the importance of secondary prevention including smoking cessation, good blood pressure control and cholesterol control.  Plan is to continue DAPT for 3 months then Plavix only. - atorvastatin (LIPITOR) 40 MG tablet; Take 1 tablet (40 mg total) by mouth daily.  Dispense: 30 tablet; Refill: 3 - clopidogrel (PLAVIX) 75 MG tablet; Take 1 tablet (75 mg total) by mouth daily.  Dispense: 30 tablet; Refill: 3  2. Tobacco dependence Advised to quit.  Discussed methods to help her quit.  Patient states that she has been cutting back with the intent of quitting.  Encouraged her to set a quit date.  Less than 5 minutes spent on counseling.  3. Seizure disorder (HCC) - lamoTRIgine (LAMICTAL) 25 MG tablet; Take 2 tablets (50 mg total) by mouth 2 (two) times daily.  Dispense: 120 tablet; Refill: 3  4. COVID-19 virus vaccination declined Encourage patient to get  vaccinated.  Informed that the vaccines are safe and effective.  She still declines.  Encouraged her to get the flu vaccine which is now available at her local pharmacy.     Follow Up Instructions: In 2 months if she does not find a PCP in [place].   I discussed the assessment and treatment plan with the patient. The patient was provided an opportunity to ask questions and all were answered. The patient agreed with the plan and demonstrated an understanding of the instructions.   The patient was advised to call back or seek an in-person evaluation if the symptoms worsen or if the condition fails to improve as anticipated.  I provided 21 minutes of non-face-to-face time during this encounter.   Karle Plumber, MD

## 2019-09-26 DIAGNOSIS — I639 Cerebral infarction, unspecified: Secondary | ICD-10-CM | POA: Diagnosis not present

## 2019-09-26 DIAGNOSIS — R569 Unspecified convulsions: Secondary | ICD-10-CM | POA: Diagnosis not present

## 2019-09-26 DIAGNOSIS — I69398 Other sequelae of cerebral infarction: Secondary | ICD-10-CM | POA: Diagnosis not present

## 2019-11-09 DIAGNOSIS — F17210 Nicotine dependence, cigarettes, uncomplicated: Secondary | ICD-10-CM | POA: Diagnosis not present

## 2019-11-09 DIAGNOSIS — E785 Hyperlipidemia, unspecified: Secondary | ICD-10-CM | POA: Diagnosis not present

## 2019-11-09 DIAGNOSIS — Z7902 Long term (current) use of antithrombotics/antiplatelets: Secondary | ICD-10-CM | POA: Diagnosis not present

## 2019-11-09 DIAGNOSIS — Z7982 Long term (current) use of aspirin: Secondary | ICD-10-CM | POA: Diagnosis not present

## 2019-11-09 DIAGNOSIS — G4700 Insomnia, unspecified: Secondary | ICD-10-CM | POA: Diagnosis not present

## 2019-11-09 DIAGNOSIS — G40909 Epilepsy, unspecified, not intractable, without status epilepticus: Secondary | ICD-10-CM | POA: Diagnosis not present

## 2019-11-09 DIAGNOSIS — I10 Essential (primary) hypertension: Secondary | ICD-10-CM | POA: Diagnosis not present

## 2019-11-09 DIAGNOSIS — Z720 Tobacco use: Secondary | ICD-10-CM | POA: Diagnosis not present

## 2019-11-09 DIAGNOSIS — M62830 Muscle spasm of back: Secondary | ICD-10-CM | POA: Diagnosis not present

## 2019-11-09 DIAGNOSIS — Z8673 Personal history of transient ischemic attack (TIA), and cerebral infarction without residual deficits: Secondary | ICD-10-CM | POA: Diagnosis not present

## 2019-11-09 DIAGNOSIS — Z79899 Other long term (current) drug therapy: Secondary | ICD-10-CM | POA: Diagnosis not present

## 2019-11-09 DIAGNOSIS — Z0000 Encounter for general adult medical examination without abnormal findings: Secondary | ICD-10-CM | POA: Diagnosis not present

## 2019-11-09 DIAGNOSIS — Z716 Tobacco abuse counseling: Secondary | ICD-10-CM | POA: Diagnosis not present

## 2019-11-18 DIAGNOSIS — Z1231 Encounter for screening mammogram for malignant neoplasm of breast: Secondary | ICD-10-CM | POA: Diagnosis not present

## 2019-12-04 ENCOUNTER — Other Ambulatory Visit: Payer: Self-pay | Admitting: Internal Medicine

## 2019-12-04 DIAGNOSIS — Z8673 Personal history of transient ischemic attack (TIA), and cerebral infarction without residual deficits: Secondary | ICD-10-CM

## 2019-12-04 DIAGNOSIS — G40909 Epilepsy, unspecified, not intractable, without status epilepticus: Secondary | ICD-10-CM

## 2020-01-14 ENCOUNTER — Other Ambulatory Visit: Payer: Self-pay | Admitting: Internal Medicine

## 2020-01-14 DIAGNOSIS — Z8673 Personal history of transient ischemic attack (TIA), and cerebral infarction without residual deficits: Secondary | ICD-10-CM

## 2020-02-09 ENCOUNTER — Other Ambulatory Visit: Payer: Self-pay | Admitting: Internal Medicine

## 2020-02-09 DIAGNOSIS — Z8673 Personal history of transient ischemic attack (TIA), and cerebral infarction without residual deficits: Secondary | ICD-10-CM

## 2020-02-09 NOTE — Telephone Encounter (Signed)
Patient called, left VM to return the call to the office to schedule an appointment. Noted last refill to schedule office visit.

## 2020-04-05 ENCOUNTER — Other Ambulatory Visit: Payer: Self-pay | Admitting: Internal Medicine

## 2020-04-05 DIAGNOSIS — Z8673 Personal history of transient ischemic attack (TIA), and cerebral infarction without residual deficits: Secondary | ICD-10-CM

## 2020-04-05 NOTE — Telephone Encounter (Signed)
Requested medications are due for refill today.  yes  Requested medications are on the active medications list.  yes  Last refill. 01/16/2020  Future visit scheduled.   no  Notes to clinic.  Courtesy refill already given.
